# Patient Record
Sex: Female | Born: 1950 | Race: Black or African American | Hispanic: No | Marital: Married | Smoking: Never smoker
Health system: Southern US, Community
[De-identification: ages and names within clinical notes are randomized; demographics above are authoritative.]

## PROBLEM LIST (undated history)

## (undated) DIAGNOSIS — F410 Panic disorder [episodic paroxysmal anxiety] without agoraphobia: Secondary | ICD-10-CM

## (undated) DIAGNOSIS — Z8679 Personal history of other diseases of the circulatory system: Secondary | ICD-10-CM

## (undated) DIAGNOSIS — D649 Anemia, unspecified: Secondary | ICD-10-CM

## (undated) DIAGNOSIS — E785 Hyperlipidemia, unspecified: Secondary | ICD-10-CM

## (undated) DIAGNOSIS — R195 Other fecal abnormalities: Secondary | ICD-10-CM

## (undated) DIAGNOSIS — E669 Obesity, unspecified: Secondary | ICD-10-CM

## (undated) DIAGNOSIS — K219 Gastro-esophageal reflux disease without esophagitis: Secondary | ICD-10-CM

## (undated) DIAGNOSIS — E119 Type 2 diabetes mellitus without complications: Secondary | ICD-10-CM

## (undated) DIAGNOSIS — R7303 Prediabetes: Secondary | ICD-10-CM

## (undated) DIAGNOSIS — L709 Acne, unspecified: Secondary | ICD-10-CM

## (undated) DIAGNOSIS — R1900 Intra-abdominal and pelvic swelling, mass and lump, unspecified site: Secondary | ICD-10-CM

## (undated) DIAGNOSIS — M7551 Bursitis of right shoulder: Secondary | ICD-10-CM

## (undated) DIAGNOSIS — M1990 Unspecified osteoarthritis, unspecified site: Secondary | ICD-10-CM

## (undated) DIAGNOSIS — C50919 Malignant neoplasm of unspecified site of unspecified female breast: Secondary | ICD-10-CM

## (undated) HISTORY — DX: Malignant neoplasm of unspecified site of unspecified female breast: C50.919

## (undated) HISTORY — PX: BREAST BIOPSY: SHX20

## (undated) HISTORY — DX: Acne, unspecified: L70.9

## (undated) HISTORY — DX: Obesity, unspecified: E66.9

## (undated) HISTORY — DX: Anemia, unspecified: D64.9

## (undated) HISTORY — DX: Hyperlipidemia, unspecified: E78.5

## (undated) HISTORY — DX: Personal history of other diseases of the circulatory system: Z86.79

## (undated) HISTORY — DX: Panic disorder (episodic paroxysmal anxiety): F41.0

## (undated) HISTORY — DX: Prediabetes: R73.03

## (undated) HISTORY — PX: ABDOMINAL HYSTERECTOMY: SHX81

## (undated) HISTORY — DX: Intra-abdominal and pelvic swelling, mass and lump, unspecified site: R19.00

## (undated) HISTORY — DX: Other fecal abnormalities: R19.5

---

## 2002-09-16 ENCOUNTER — Other Ambulatory Visit: Admission: RE | Admit: 2002-09-16 | Discharge: 2002-09-16 | Payer: Self-pay | Admitting: Obstetrics and Gynecology

## 2004-05-14 ENCOUNTER — Ambulatory Visit (HOSPITAL_COMMUNITY): Admission: RE | Admit: 2004-05-14 | Discharge: 2004-05-14 | Payer: Self-pay | Admitting: Family Medicine

## 2004-12-04 ENCOUNTER — Other Ambulatory Visit: Admission: RE | Admit: 2004-12-04 | Discharge: 2004-12-04 | Payer: Self-pay | Admitting: Obstetrics and Gynecology

## 2004-12-11 ENCOUNTER — Ambulatory Visit (HOSPITAL_COMMUNITY): Admission: RE | Admit: 2004-12-11 | Discharge: 2004-12-11 | Payer: Self-pay | Admitting: Obstetrics and Gynecology

## 2005-05-11 DIAGNOSIS — Z8679 Personal history of other diseases of the circulatory system: Secondary | ICD-10-CM

## 2005-05-11 HISTORY — DX: Personal history of other diseases of the circulatory system: Z86.79

## 2005-05-14 ENCOUNTER — Ambulatory Visit (HOSPITAL_COMMUNITY): Admission: RE | Admit: 2005-05-14 | Discharge: 2005-05-14 | Payer: Self-pay | Admitting: Obstetrics and Gynecology

## 2006-12-09 ENCOUNTER — Ambulatory Visit (HOSPITAL_COMMUNITY): Admission: RE | Admit: 2006-12-09 | Discharge: 2006-12-09 | Payer: Self-pay | Admitting: Obstetrics and Gynecology

## 2006-12-31 ENCOUNTER — Encounter: Admission: RE | Admit: 2006-12-31 | Discharge: 2006-12-31 | Payer: Self-pay | Admitting: Obstetrics and Gynecology

## 2009-02-13 ENCOUNTER — Other Ambulatory Visit: Admission: RE | Admit: 2009-02-13 | Discharge: 2009-02-13 | Payer: Self-pay | Admitting: Gynecology

## 2009-02-13 ENCOUNTER — Ambulatory Visit: Payer: Self-pay | Admitting: Gynecology

## 2009-02-27 ENCOUNTER — Ambulatory Visit: Payer: Self-pay | Admitting: Gynecology

## 2009-03-28 ENCOUNTER — Ambulatory Visit: Payer: Self-pay | Admitting: Gynecology

## 2009-04-05 ENCOUNTER — Ambulatory Visit: Admission: RE | Admit: 2009-04-05 | Discharge: 2009-04-05 | Payer: Self-pay | Admitting: Gynecologic Oncology

## 2009-06-28 ENCOUNTER — Ambulatory Visit: Payer: Self-pay | Admitting: Gynecology

## 2010-03-07 ENCOUNTER — Other Ambulatory Visit: Payer: Self-pay | Admitting: Gynecology

## 2010-03-07 DIAGNOSIS — Z1239 Encounter for other screening for malignant neoplasm of breast: Secondary | ICD-10-CM

## 2010-03-18 ENCOUNTER — Ambulatory Visit: Payer: Self-pay

## 2010-03-19 ENCOUNTER — Other Ambulatory Visit: Payer: Self-pay | Admitting: Gynecology

## 2010-03-19 ENCOUNTER — Encounter (INDEPENDENT_AMBULATORY_CARE_PROVIDER_SITE_OTHER): Payer: Self-pay | Admitting: *Deleted

## 2010-03-19 DIAGNOSIS — Z0000 Encounter for general adult medical examination without abnormal findings: Secondary | ICD-10-CM

## 2010-03-28 NOTE — Letter (Signed)
Summary: Pre Visit Letter Revised  [facility]  [address] [place], [place] [address]   Phone: [phone]  Fax: [phone]        03/19/2010 MRN: [id number] Brandy Guerrero [address] [place], [place]  [address]             Procedure Date:  April 15, 2010   dir col-Dr Justin Mend to the Gastroenterology Division at [facility]    You are scheduled to see a nurse for your pre-procedure visit on April 01, 2010 at 4:00pm on the 3rd floor at [facility], [address]  We ask that you try to arrive at our office 15 minutes prior to your appointment time to allow for check-in.  Please take a minute to review the attached form.  If you answer "Yes" to one or more of the questions on the first page, we ask that you call the person listed at your earliest opportunity.  If you answer "No" to all of the questions, please complete the rest of the form and bring it to your appointment.    Your nurse visit will consist of discussing your medical and surgical history, your immediate family medical history, and your medications.   If you are unable to list all of your medications on the form, please bring the medication bottles to your appointment and we will list them.  We will need to be aware of both prescribed and over the counter drugs.  We will need to know exact dosage information as well.    Please be prepared to read and sign documents such as consent forms, a financial agreement, and acknowledgement forms.  If necessary, and with your consent, a friend or relative is welcome to sit-in on the nurse visit with you.  Please bring your insurance card so that we may make a copy of it.  If your insurance requires a referral to see a specialist, please bring your referral form from your primary care physician.  No co-pay is required for this nurse visit.     If you cannot keep your appointment, please call [phone] to cancel or reschedule prior to your  appointment date.  This allows [place] the opportunity to schedule an appointment for another patient in need of care.    Thank you for choosing [facility] for your medical needs.  We appreciate the opportunity to care for you.  Please visit [place] at our website  to learn more about our practice.  Sincerely, The Gastroenterology Division

## 2010-04-10 ENCOUNTER — Ambulatory Visit
Admission: RE | Admit: 2010-04-10 | Discharge: 2010-04-10 | Disposition: A | Discharge status: Home / Self Care | Payer: PRIVATE HEALTH INSURANCE | Source: Ambulatory Visit | Attending: Gynecology | Admitting: Gynecology

## 2010-04-10 DIAGNOSIS — Z1239 Encounter for other screening for malignant neoplasm of breast: Secondary | ICD-10-CM

## 2010-04-15 ENCOUNTER — Other Ambulatory Visit: Payer: Self-pay | Admitting: Internal Medicine

## 2011-04-22 ENCOUNTER — Other Ambulatory Visit: Payer: Self-pay | Admitting: Gynecology

## 2011-04-22 DIAGNOSIS — Z1231 Encounter for screening mammogram for malignant neoplasm of breast: Secondary | ICD-10-CM

## 2011-05-16 ENCOUNTER — Ambulatory Visit
Admission: RE | Admit: 2011-05-16 | Discharge: 2011-05-16 | Disposition: A | Discharge status: Home / Self Care | Payer: PRIVATE HEALTH INSURANCE | Source: Ambulatory Visit | Attending: Gynecology | Admitting: Gynecology

## 2011-05-16 DIAGNOSIS — Z1231 Encounter for screening mammogram for malignant neoplasm of breast: Secondary | ICD-10-CM

## 2011-05-20 ENCOUNTER — Encounter: Payer: Self-pay | Admitting: *Deleted

## 2011-05-21 ENCOUNTER — Encounter: Payer: Self-pay | Admitting: Gynecology

## 2011-05-21 ENCOUNTER — Other Ambulatory Visit (HOSPITAL_COMMUNITY)
Admission: RE | Admit: 2011-05-21 | Discharge: 2011-05-21 | Disposition: A | Discharge status: Home / Self Care | Payer: PRIVATE HEALTH INSURANCE | Source: Ambulatory Visit | Attending: Gynecology | Admitting: Gynecology

## 2011-05-21 ENCOUNTER — Ambulatory Visit (INDEPENDENT_AMBULATORY_CARE_PROVIDER_SITE_OTHER): Payer: PRIVATE HEALTH INSURANCE | Admitting: Gynecology

## 2011-05-21 VITALS — BP 110/70 | Ht 63.5 in | Wt 206.0 lb

## 2011-05-21 DIAGNOSIS — Z780 Asymptomatic menopausal state: Secondary | ICD-10-CM

## 2011-05-21 DIAGNOSIS — R1900 Intra-abdominal and pelvic swelling, mass and lump, unspecified site: Secondary | ICD-10-CM

## 2011-05-21 DIAGNOSIS — Z1272 Encounter for screening for malignant neoplasm of vagina: Secondary | ICD-10-CM

## 2011-05-21 DIAGNOSIS — K429 Umbilical hernia without obstruction or gangrene: Secondary | ICD-10-CM

## 2011-05-21 DIAGNOSIS — Z01419 Encounter for gynecological examination (general) (routine) without abnormal findings: Secondary | ICD-10-CM | POA: Insufficient documentation

## 2011-05-21 NOTE — Progress Notes (Addendum)
Estera Ozier 01-Jun-1950 [id number]        61 y.o.  for annual exam.  Several issues noted below  Past medical history,surgical history, medications, allergies, family history and social history were all reviewed and documented in the EPIC chart. ROS:  Was performed and pertinent positives and negatives are included in the history.  Exam: Sherrilyn Rist chaperone present Filed Vitals:   05/21/11 0942  BP: 110/70   General appearance  Normal Skin grossly normal Head/Neck normal with no cervical or supraclavicular adenopathy thyroid normal Lungs  clear Cardiac RR, without RMG Abdominal  soft, nontender, without masses, organomegaly, umbilical hernia noted Breasts  examined lying and sitting without masses, retractions, discharge or axillary adenopathy. Pelvic  Ext/BUS/vagina  normal Pap of cuff done  Adnexa  With grapefruit size mass at the cuff, nontender   Anus and perineum  normal   Rectovaginal  normal sphincter tone without palpated masses or tenderness.    Assessment/Plan:  61 y.o. female for annual exam.    1. Pelvic mass. Patient was seen January 2011 with approximately same size pelvic mass.  She had ultrasound which showed a 12.8 cm complex solid mass. She had a CA 125 that was 5.8 she was referred to GYN oncology and saw Dr. Laurette Schimke who had recommended surgery but the patient never followed up with her to have this done. Reviewed with the patient the need for follow up. Given the circumstance most likely benign or low level malignant as it is unchanged in size and the patient is asymptomatic. Recommended start with baseline CT scan and and probable repeat referral to GYN oncology possible consider laparoscopic surgery as the patient's big concern is down time the surgery. The importance of follow up was stressed with the patient. 2. Umbilical hernia. Patient's had a long history of umbilical hernia which is not overly bothersome to her. Offered referral to Gen. Surgery to consider  repair or if she proceeds with surgery per #1 that could be repaired at the same time the patient will follow up per #1 and then we'll go from there. 3. Colonoscopy. Patient's daughter had a colonoscopy we gave her the name of several facilities and she is to call to arrange this. 4. Mammography. Patient just had her mammogram and continue with annual mammography. SBE monthly reviewed. 5. Pap smear. I did a Pap smear of the cuff today. As I have no significant records from prior Pap smears. 6. Bone density. Patient's never had a bone density and I think given her postmenopausal status we'll get a baseline and she agrees to schedule this. 7. Health maintenance. No blood work was done today she has an appointment in June to see a primary physician to follow her for her medical issues.    Dara Lords MD, 10:08 AM 05/21/2011

## 2011-05-21 NOTE — Patient Instructions (Signed)
Follow up for bone density study as scheduled. Follow up for CT scan my office staff will call and arrange with you. Follow up with primary physician to manage your medical issues. Schedule colonoscopy

## 2011-05-21 NOTE — Progress Notes (Signed)
Addended by: Richardson Chiquito on: 05/21/2011 10:58 AM   Modules accepted: Orders

## 2011-05-22 ENCOUNTER — Other Ambulatory Visit: Payer: Self-pay | Admitting: *Deleted

## 2011-05-22 ENCOUNTER — Telehealth: Payer: Self-pay | Admitting: *Deleted

## 2011-05-22 DIAGNOSIS — R1900 Intra-abdominal and pelvic swelling, mass and lump, unspecified site: Secondary | ICD-10-CM

## 2011-05-22 NOTE — Telephone Encounter (Signed)
Message copied by Mckinley Jewel Stephfon Bovey L on Thu May 22, 2011  9:46 AM ------      Message from: Colin Broach P      Created: Wed May 21, 2011 11:37 AM       Schedule CT scan with and without contrast RE pelvic mass at vaginal cuff, history of hysterectomy

## 2011-05-22 NOTE — Telephone Encounter (Signed)
Lm for patient to call.  Has appt set for CT at [facility] on 05/27/11 @ 10:30 but must arrive for 9:30 for BUN/Creatinine at 9:30.

## 2011-05-23 ENCOUNTER — Encounter: Payer: Self-pay | Admitting: Gynecology

## 2011-05-23 NOTE — Telephone Encounter (Signed)
Lm for patient to call

## 2011-05-26 NOTE — Telephone Encounter (Signed)
Patient informed. 

## 2011-05-27 ENCOUNTER — Ambulatory Visit (HOSPITAL_COMMUNITY)
Admission: RE | Admit: 2011-05-27 | Discharge: 2011-05-27 | Disposition: A | Discharge status: Home / Self Care | Payer: PRIVATE HEALTH INSURANCE | Source: Ambulatory Visit | Attending: Gynecology | Admitting: Gynecology

## 2011-05-27 DIAGNOSIS — R1900 Intra-abdominal and pelvic swelling, mass and lump, unspecified site: Secondary | ICD-10-CM

## 2011-05-28 ENCOUNTER — Ambulatory Visit (HOSPITAL_COMMUNITY)
Admission: RE | Admit: 2011-05-28 | Discharge: 2011-05-28 | Disposition: A | Discharge status: Home / Self Care | Payer: PRIVATE HEALTH INSURANCE | Source: Ambulatory Visit | Attending: Gynecology | Admitting: Gynecology

## 2011-05-28 DIAGNOSIS — Z90710 Acquired absence of both cervix and uterus: Secondary | ICD-10-CM | POA: Insufficient documentation

## 2011-05-28 DIAGNOSIS — K7689 Other specified diseases of liver: Secondary | ICD-10-CM | POA: Insufficient documentation

## 2011-05-28 DIAGNOSIS — Z905 Acquired absence of kidney: Secondary | ICD-10-CM | POA: Insufficient documentation

## 2011-05-28 DIAGNOSIS — N289 Disorder of kidney and ureter, unspecified: Secondary | ICD-10-CM | POA: Insufficient documentation

## 2011-05-28 DIAGNOSIS — K429 Umbilical hernia without obstruction or gangrene: Secondary | ICD-10-CM | POA: Insufficient documentation

## 2011-05-28 DIAGNOSIS — R1909 Other intra-abdominal and pelvic swelling, mass and lump: Secondary | ICD-10-CM | POA: Insufficient documentation

## 2011-05-28 DIAGNOSIS — M129 Arthropathy, unspecified: Secondary | ICD-10-CM | POA: Insufficient documentation

## 2011-05-28 DIAGNOSIS — K5730 Diverticulosis of large intestine without perforation or abscess without bleeding: Secondary | ICD-10-CM | POA: Insufficient documentation

## 2011-05-28 DIAGNOSIS — K5900 Constipation, unspecified: Secondary | ICD-10-CM | POA: Insufficient documentation

## 2011-05-28 MED ORDER — IOHEXOL 300 MG/ML  SOLN
100.0000 mL | Freq: Once | INTRAMUSCULAR | Status: AC | PRN
  Administered 2011-05-28: 100 mL via INTRAVENOUS

## 2011-05-29 ENCOUNTER — Ambulatory Visit (INDEPENDENT_AMBULATORY_CARE_PROVIDER_SITE_OTHER): Payer: PRIVATE HEALTH INSURANCE | Admitting: Gynecology

## 2011-05-29 ENCOUNTER — Encounter: Payer: Self-pay | Admitting: Gynecology

## 2011-05-29 ENCOUNTER — Ambulatory Visit (INDEPENDENT_AMBULATORY_CARE_PROVIDER_SITE_OTHER): Payer: PRIVATE HEALTH INSURANCE

## 2011-05-29 DIAGNOSIS — R1900 Intra-abdominal and pelvic swelling, mass and lump, unspecified site: Secondary | ICD-10-CM

## 2011-05-29 DIAGNOSIS — Z13820 Encounter for screening for osteoporosis: Secondary | ICD-10-CM

## 2011-05-29 DIAGNOSIS — Z780 Asymptomatic menopausal state: Secondary | ICD-10-CM

## 2011-05-29 NOTE — Progress Notes (Signed)
Patient follows up for 2 issues. She actually was here for her DEXA study and I asked to speak to her in reference to her pelvic mass. 1. DEXA. Her X. Is normal but due to her most recent CT scan with contrast L3 and L4 I think are factitiously elevated and BNP and I discarded them. Her L1 and L2 as well as her hips are all normal. I think given the total picture it's not worth having her come back to repeat this and that will accepted these results and then plan on repeating her DEXA at age 6. Patient agrees with this plan. 2. Pelvic mass. Her CT scan shows in part the following results:  "A bilobed pelvic mass is present. The left upper lobulation of  this mass measures 6.0 x 4.8 by 4.1 cm and the lower portion of the mass, which is central within the anatomic pelvis, measures 11.4 x 8.9 by 9.6 cm. This mass appears solid but has Mildly internally heterogeneous elements Right adnexal structure which could represent another extension of this mass or the right ovary measures 2.6 x 2.7 cm on image 60 of series 2. The left external iliac node has a short axis of 0.8 cm on image 63 of series 2.  No omental nodularity or ascites observed. Scattered sigmoid diverticula are present."  I again reviewed my concern that this represents a malignancy. Given the longevity of its presence possibilities for low-grade cancer discussed. She again is reluctant to want to do anything and I stressed my concern and strong recommendation that she follow up with a GYN oncologist to consider removal. I've offered to make this appointment for her today and she declined. She said that she will call back when she looks at her schedule and allow [place] to make this appointment for her. She clearly understands the importance of follow up and the possibility of cancer and that if she does nothing and it is cancer, the possibility of fatality.

## 2011-05-29 NOTE — Patient Instructions (Addendum)
Please call us and allow [place] to arrange an appointment with a GYN oncologist.  There is a possibility this tumor could be cancer and you really need to have this excised.

## 2011-09-17 ENCOUNTER — Ambulatory Visit (INDEPENDENT_AMBULATORY_CARE_PROVIDER_SITE_OTHER): Payer: PRIVATE HEALTH INSURANCE | Admitting: Family Medicine

## 2011-09-17 ENCOUNTER — Encounter: Payer: Self-pay | Admitting: Family Medicine

## 2011-09-17 VITALS — BP 122/70 | HR 64 | Temp 98.0°F | Ht 64.0 in | Wt 209.5 lb

## 2011-09-17 DIAGNOSIS — R002 Palpitations: Secondary | ICD-10-CM | POA: Insufficient documentation

## 2011-09-17 DIAGNOSIS — E669 Obesity, unspecified: Secondary | ICD-10-CM | POA: Insufficient documentation

## 2011-09-17 DIAGNOSIS — K429 Umbilical hernia without obstruction or gangrene: Secondary | ICD-10-CM | POA: Insufficient documentation

## 2011-09-17 DIAGNOSIS — L709 Acne, unspecified: Secondary | ICD-10-CM | POA: Insufficient documentation

## 2011-09-17 DIAGNOSIS — F410 Panic disorder [episodic paroxysmal anxiety] without agoraphobia: Secondary | ICD-10-CM

## 2011-09-17 DIAGNOSIS — I480 Paroxysmal atrial fibrillation: Secondary | ICD-10-CM | POA: Insufficient documentation

## 2011-09-17 DIAGNOSIS — R1900 Intra-abdominal and pelvic swelling, mass and lump, unspecified site: Secondary | ICD-10-CM

## 2011-09-17 DIAGNOSIS — E66811 Obesity, class 1: Secondary | ICD-10-CM | POA: Insufficient documentation

## 2011-09-17 DIAGNOSIS — L708 Other acne: Secondary | ICD-10-CM

## 2011-09-17 DIAGNOSIS — E785 Hyperlipidemia, unspecified: Secondary | ICD-10-CM

## 2011-09-17 DIAGNOSIS — E1169 Type 2 diabetes mellitus with other specified complication: Secondary | ICD-10-CM | POA: Insufficient documentation

## 2011-09-17 DIAGNOSIS — I499 Cardiac arrhythmia, unspecified: Secondary | ICD-10-CM

## 2011-09-17 NOTE — Patient Instructions (Addendum)
Return fasting for blood work.  Return afterwards for physcial. Good to see you today, call us with questions. I do recommend thinking about seeing surgeon about possible removal of mass.

## 2011-09-17 NOTE — Assessment & Plan Note (Signed)
Check FLP at next fasting blood work. 

## 2011-09-17 NOTE — Assessment & Plan Note (Signed)
Per pt.  States prior on coumadin. Have requested records from cards as well as prior PCP [facility]) Continue toprol xl for now.

## 2011-09-17 NOTE — Addendum Note (Signed)
Addended by: Eustaquio Boyden on: 09/17/2011 10:39 PM   Modules accepted: Orders

## 2011-09-17 NOTE — Progress Notes (Addendum)
Subjective:    Patient ID: Brandy Guerrero, female    DOB: October 16, 1950, 61 y.o.   MRN: [id number]  HPI CC: new pt to establish  H/o "heart fluttering".  Has been on coumadin in past.  Self stopped.  Sees Dr. Tresa Endo as cardiologist.  Have requested records from cards.  On doxycycline 100mg  daily for face per dermatologist - ?adult acne.  Pelvic mass - present since at least 2011, on rpt CT 05/2011: Large heterogeneous mass in the pelvis, with left upper lobulation, as well as a right ovarian tissue versus mass extension. Resection is recommended. No ascites or definite omental nodularity.  Dr. Audie Box is OBGYN.  Has extensively discussed ddx and aware could be malignant.  Sounds like has seen onc in past, but has currently decided against resection 2/2 financial concerns.  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  Body mass index is 35.96 kg/(m^2).   Caffeine: none Lives with husband, has grown children, outside pets (husband is deer Therapist, nutritional) Occupation: CNA at [employer], works 2nd shift, [employer] on weekends Activity: no regular exercise Diet: good water, fruits/vegetables daily  Preventative: Well woman with Dr. Audie Box - pap and breast exam 05/2011 Mammogram 05/2011  Medications and allergies reviewed and updated in chart.  Past histories reviewed and updated if relevant as below. There is no problem list on file for this patient.  Past Medical History  Diagnosis Date  . Panic attacks   . HLD (hyperlipidemia)    Past Surgical History  Procedure Date  . Abdominal hysterectomy 1990s    has one ovary remaining ([facility])   History  Substance Use Topics  . Smoking status: Never Smoker   . Smokeless tobacco: Never Used  . Alcohol Use: Yes     Rare   Family History  Problem Relation Age of Onset  . Hypertension Mother   . Alcohol abuse Brother   . Alcohol abuse Father   . Cancer Neg Hx   . Coronary artery disease Neg Hx   . Stroke Neg Hx   . Diabetes Daughter      Allergies  Allergen Reactions  . Sulfa Antibiotics     Pt unsure   Current Outpatient Prescriptions on File Prior to Visit  Medication Sig Dispense Refill  . fish oil-omega-3 fatty acids 1000 MG capsule Take 2 g by mouth daily.      . metoprolol succinate (TOPROL-XL) 25 MG 24 hr tablet Take 25 mg by mouth daily.      . simvastatin (ZOCOR) 40 MG tablet Take 40 mg by mouth daily. Remembers better during day        Review of Systems  Constitutional: Negative for fever, chills, activity change, appetite change, fatigue and unexpected weight change.  HENT: Negative for hearing loss and neck pain.   Eyes: Negative for visual disturbance.  Respiratory: Negative for cough, chest tightness, shortness of breath and wheezing.   Cardiovascular: Negative for chest pain, palpitations and leg swelling.  Gastrointestinal: Negative for nausea, vomiting, abdominal pain, diarrhea, constipation, blood in stool and abdominal distention.  Genitourinary: Negative for hematuria and difficulty urinating.  Musculoskeletal: Negative for myalgias and arthralgias.  Skin: Negative for rash.  Neurological: Positive for headaches (mild intermittent HA). Negative for dizziness, seizures and syncope.  Hematological: Does not bruise/bleed easily.  Psychiatric/Behavioral: Negative for dysphoric mood. The patient is not nervous/anxious.        Objective:   Physical Exam  Nursing note and vitals reviewed. Constitutional: She is oriented to person,  place, and time. She appears well-developed and well-nourished. No distress.       obese  HENT:  Head: Normocephalic and atraumatic.  Right Ear: Hearing, tympanic membrane, external ear and ear canal normal.  Left Ear: Hearing, tympanic membrane, external ear and ear canal normal.  Nose: Nose normal.  Mouth/Throat: Oropharynx is clear and moist. No oropharyngeal exudate.  Eyes: Conjunctivae and EOM are normal. Pupils are equal, round, and reactive to light. No scleral  icterus.  Neck: Normal range of motion. Neck supple. No thyromegaly present.  Cardiovascular: Normal rate, regular rhythm, normal heart sounds and intact distal pulses.   No murmur heard. Pulses:      Radial pulses are 2+ on the right side, and 2+ on the left side.  Pulmonary/Chest: Effort normal and breath sounds normal. No respiratory distress. She has no wheezes. She has no rales.  Abdominal: Soft. Bowel sounds are normal. She exhibits no distension and no mass. There is no tenderness. There is no rebound and no guarding.       3 cm umbilical hernia  Musculoskeletal: Normal range of motion. She exhibits no edema.  Lymphadenopathy:    She has no cervical adenopathy.  Neurological: She is alert and oriented to person, place, and time.       CN grossly intact, station and gait intact  Skin: Skin is warm and dry. No rash noted.  Psychiatric: She has a normal mood and affect. Her behavior is normal. Judgment and thought content normal.       Assessment & Plan:

## 2011-09-17 NOTE — Assessment & Plan Note (Addendum)
Has been present since early 2011, has seen Dr. Nelly Rout (GYN onc) in past and declined resection although this has been recommended multiple times. Discussed recent CT scan 05/2011 with continued concerning finding of pelvic mass.  Pt states doesn't think malignancy because she feels well and has been gaining weight. Discussed how malignancy can only be ruled out by tissue biopsy, and that is why resection is recommended. Pt continues to decline resection/further evaluation of this mass.

## 2011-09-18 ENCOUNTER — Other Ambulatory Visit (INDEPENDENT_AMBULATORY_CARE_PROVIDER_SITE_OTHER): Payer: PRIVATE HEALTH INSURANCE

## 2011-09-18 DIAGNOSIS — I499 Cardiac arrhythmia, unspecified: Secondary | ICD-10-CM

## 2011-09-18 DIAGNOSIS — E785 Hyperlipidemia, unspecified: Secondary | ICD-10-CM

## 2011-09-18 DIAGNOSIS — R1900 Intra-abdominal and pelvic swelling, mass and lump, unspecified site: Secondary | ICD-10-CM

## 2011-09-18 LAB — COMPREHENSIVE METABOLIC PANEL
Albumin: 4 g/dL (ref 3.5–5.2)
Alkaline Phosphatase: 98 U/L (ref 39–117)
BUN: 17 mg/dL (ref 6–23)
CO2: 27 mEq/L (ref 19–32)
Calcium: 9.3 mg/dL (ref 8.4–10.5)
Chloride: 106 mEq/L (ref 96–112)
Creatinine, Ser: 0.8 mg/dL (ref 0.4–1.2)
GFR: 88.99 mL/min (ref >60.00)
Glucose, Bld: 113 mg/dL — ABNORMAL HIGH (ref 70–99)
Potassium: 4.1 mEq/L (ref 3.5–5.1)
Sodium: 140 mEq/L (ref 135–145)
Total Bilirubin: 0.8 mg/dL (ref 0.3–1.2)
Total Protein: 7.5 g/dL (ref 6.0–8.3)

## 2011-09-18 LAB — CBC WITH DIFFERENTIAL/PLATELET
Basophils Absolute: 0 10*3/uL (ref 0.0–0.1)
Eosinophils Relative: 2.6 % (ref 0.0–5.0)
HCT: 36.5 % (ref 36.0–46.0)
Lymphocytes Relative: 28.7 % (ref 12.0–46.0)
Lymphs Abs: 2.5 10*3/uL (ref 0.7–4.0)
MCHC: 32.7 g/dL (ref 30.0–36.0)
MCV: 91.7 fl (ref 78.0–100.0)
Monocytes Absolute: 0.6 10*3/uL (ref 0.1–1.0)
Monocytes Relative: 6.5 % (ref 3.0–12.0)
Neutro Abs: 5.3 10*3/uL (ref 1.4–7.7)
Neutrophils Relative %: 61.7 % (ref 43.0–77.0)
Platelets: 241 10*3/uL (ref 150.0–400.0)
RBC: 3.98 Mil/uL (ref 3.87–5.11)
RDW: 13.6 % (ref 11.5–14.6)
WBC: 8.6 10*3/uL (ref 4.5–10.5)

## 2011-09-18 LAB — LIPID PANEL
Cholesterol: 162 mg/dL (ref 0–200)
HDL: 49.3 mg/dL (ref >39.00)
LDL Cholesterol: 99 mg/dL (ref 0–99)
Triglycerides: 67 mg/dL (ref 0.0–149.0)
VLDL: 13.4 mg/dL (ref 0.0–40.0)

## 2011-09-18 LAB — TSH: TSH: 1.48 u[IU]/mL (ref 0.35–5.50)

## 2011-10-21 ENCOUNTER — Encounter: Payer: Self-pay | Admitting: Family Medicine

## 2011-10-21 ENCOUNTER — Telehealth: Payer: Self-pay | Admitting: Family Medicine

## 2011-10-21 ENCOUNTER — Ambulatory Visit (INDEPENDENT_AMBULATORY_CARE_PROVIDER_SITE_OTHER): Payer: PRIVATE HEALTH INSURANCE | Admitting: Family Medicine

## 2011-10-21 VITALS — BP 114/62 | HR 76 | Temp 98.2°F | Wt 206.8 lb

## 2011-10-21 DIAGNOSIS — E1169 Type 2 diabetes mellitus with other specified complication: Secondary | ICD-10-CM | POA: Insufficient documentation

## 2011-10-21 DIAGNOSIS — E119 Type 2 diabetes mellitus without complications: Secondary | ICD-10-CM | POA: Insufficient documentation

## 2011-10-21 DIAGNOSIS — Z0001 Encounter for general adult medical examination with abnormal findings: Secondary | ICD-10-CM | POA: Insufficient documentation

## 2011-10-21 DIAGNOSIS — R1900 Intra-abdominal and pelvic swelling, mass and lump, unspecified site: Secondary | ICD-10-CM

## 2011-10-21 DIAGNOSIS — E669 Obesity, unspecified: Secondary | ICD-10-CM

## 2011-10-21 DIAGNOSIS — Z23 Encounter for immunization: Secondary | ICD-10-CM

## 2011-10-21 DIAGNOSIS — I499 Cardiac arrhythmia, unspecified: Secondary | ICD-10-CM

## 2011-10-21 DIAGNOSIS — R739 Hyperglycemia, unspecified: Secondary | ICD-10-CM

## 2011-10-21 DIAGNOSIS — Z0000 Encounter for general adult medical examination without abnormal findings: Secondary | ICD-10-CM | POA: Insufficient documentation

## 2011-10-21 DIAGNOSIS — E785 Hyperlipidemia, unspecified: Secondary | ICD-10-CM

## 2011-10-21 DIAGNOSIS — Z1211 Encounter for screening for malignant neoplasm of colon: Secondary | ICD-10-CM

## 2011-10-21 DIAGNOSIS — R7309 Other abnormal glucose: Secondary | ICD-10-CM

## 2011-10-21 MED ORDER — METOPROLOL TARTRATE 25 MG PO TABS: 25.0000 mg | ORAL_TABLET | Freq: Two times a day (BID) | ORAL | Status: DC | PRN

## 2011-10-21 MED ORDER — TOPIRAMATE 25 MG PO TABS: 25.0000 mg | ORAL_TABLET | Freq: Every day | ORAL | Status: DC

## 2011-10-21 NOTE — Assessment & Plan Note (Signed)
Will again request records from Dr. Tresa Endo.

## 2011-10-21 NOTE — Telephone Encounter (Signed)
rec try vaseline first

## 2011-10-21 NOTE — Assessment & Plan Note (Signed)
Reviewed #s with pt, encouraged weight loss and increased activity.

## 2011-10-21 NOTE — Assessment & Plan Note (Signed)
Preventative protocols reviewed and updated unless pt declined. discussed healthy diet/lifestyle. iFOB today Tdap today. Well woman with OBGYN done 05/2011.

## 2011-10-21 NOTE — Assessment & Plan Note (Signed)
Discussed lifestyle/diet changes.  Discussed how weight loss meds have not been shown to keep weight off.  Pt still desires to use medication to help start lifestyle changes. Discussed qnexa.  Discussed avoidance of stimulants given ?h/o arrhythmia. Will do trial of topamax 25mg  daily.  rtc 3 mo for f/u.

## 2011-10-21 NOTE — Assessment & Plan Note (Signed)
Chronic, stable. Great control on current med. Lab Results  Component Value Date   CHOL 162 09/18/2011   HDL 49.30 09/18/2011   LDLCALC 99 09/18/2011   TRIG 67.0 09/18/2011   CHOLHDL 3 09/18/2011

## 2011-10-21 NOTE — Telephone Encounter (Signed)
When checking out pt, she said she forgot to ask you what she can do for her dry lips, she has tried a lot of different moisturizers and chap sticks and nothing has help, she said they are always dry and cracking

## 2011-10-21 NOTE — Progress Notes (Signed)
Subjective:    Patient ID: Brandy Guerrero, female    DOB: 04-25-1950, 61 y.o.   MRN: [id number]  HPI CC: CPE   Pelvic mass - has not f/u with GYN onc.  Declines further eval currently.  See prior note.  Aware of risks of this approach.  Continues to defer 2/2 financial concerns.  Thinks eventually will pursue surgery, and will call GYN when decides to pursue treatment.  H/o "heart fluttering". Has been on coumadin in past. Self stopped. Prior saw Dr. Tresa Endo as cardiologist. We have requested records from cards but not received yet.  Has not f/u with Dr. Tresa Endo in several years.  Occasional heart fluttering associated with dizziness, attributes to stress.  Prescribed metoprolol XL for this.  However doesn't take regularly, just PRN.  Does not want to take regularly.  No recent heart flutter episodes.  Caffeine: none  Lives with husband, has grown children, outside pets (husband is Product manager)  Occupation: CNA at [employer], works 2nd shift, [employer] on weekends  Activity: starting regular walking - 3 mi about 2d/wk Diet: good water, fruits/vegetables daily   Preventative:  Well woman with Dr. Audie Box - pap and breast exam 05/2011  Mammogram 05/2011 Tetanus today. (Tdap) Colon cancer screening - ifob requested. Recommended flu shot yearly.  Wt Readings from Last 3 Encounters:  10/21/11 206 lb 12 oz (93.781 kg)  09/17/11 209 lb 8 oz (95.029 kg)  05/21/11 206 lb (93.441 kg)    Medications and allergies reviewed and updated in chart.  Past histories reviewed and updated if relevant as below. Patient Active Problem List  Diagnosis  . Panic attacks  . HLD (hyperlipidemia)  . Acne  . Pelvic mass  . Irregular heart rate  . Umbilical hernia  . Obesity   Past Medical History  Diagnosis Date  . Panic attacks   . HLD (hyperlipidemia)   . Acne     dermatology - on doxy 100mg  daily  . Pelvic mass     declines resection  . Obesity    Past Surgical History  Procedure Date  .  Abdominal hysterectomy 1990s    has one ovary remaining ([facility])   History  Substance Use Topics  . Smoking status: Never Smoker   . Smokeless tobacco: Never Used  . Alcohol Use: Yes     Rare   Family History  Problem Relation Age of Onset  . Hypertension Mother   . Alcohol abuse Brother   . Alcohol abuse Father   . Cancer Neg Hx   . Coronary artery disease Neg Hx   . Stroke Neg Hx   . Diabetes Daughter    Allergies  Allergen Reactions  . Sulfa Antibiotics Rash   Current Outpatient Prescriptions on File Prior to Visit  Medication Sig Dispense Refill  . aspirin 81 MG tablet Take 81 mg by mouth daily.      Marland Kitchen doxycycline (VIBRAMYCIN) 100 MG capsule Take 100 mg by mouth daily. For face      . fish oil-omega-3 fatty acids 1000 MG capsule Take 2 g by mouth daily.      . meloxicam (MOBIC) 15 MG tablet Take 15 mg by mouth daily.      . NON FORMULARY Hair and nail vitamin      . simvastatin (ZOCOR) 40 MG tablet Take 40 mg by mouth daily. Remembers better during day      . metoprolol tartrate (LOPRESSOR) 25 MG tablet Take 1 tablet (25 mg total) by mouth  2 (two) times daily as needed. Heart fluttering  60 tablet  3   Review of Systems  Constitutional: Negative for fever, chills, activity change, appetite change, fatigue and unexpected weight change.  HENT: Negative for hearing loss and neck pain.   Eyes: Negative for visual disturbance.  Respiratory: Negative for cough, chest tightness, shortness of breath and wheezing.   Cardiovascular: Negative for chest pain, palpitations and leg swelling.  Gastrointestinal: Negative for nausea, vomiting, abdominal pain, diarrhea, constipation, blood in stool and abdominal distention.  Genitourinary: Negative for hematuria, vaginal bleeding, vaginal discharge and difficulty urinating.  Musculoskeletal: Negative for myalgias and arthralgias.  Skin: Negative for rash.  Neurological: Positive for headaches (mild intermittent HA). Negative for  dizziness, seizures and syncope.  Hematological: Does not bruise/bleed easily.  Psychiatric/Behavioral: Negative for dysphoric mood. The patient is not nervous/anxious.        Objective:   Physical Exam  Nursing note and vitals reviewed. Constitutional: She is oriented to person, place, and time. She appears well-developed and well-nourished. No distress.  HENT:  Head: Normocephalic and atraumatic.  Right Ear: Hearing, tympanic membrane, external ear and ear canal normal.  Left Ear: Hearing, tympanic membrane, external ear and ear canal normal.  Nose: Nose normal.  Mouth/Throat: Oropharynx is clear and moist. No oropharyngeal exudate.  Eyes: Conjunctivae and EOM are normal. Pupils are equal, round, and reactive to light. No scleral icterus.  Neck: Normal range of motion. Neck supple.  Cardiovascular: Normal rate, regular rhythm, normal heart sounds and intact distal pulses.   No murmur heard. Pulses:      Radial pulses are 2+ on the right side, and 2+ on the left side.  Pulmonary/Chest: Effort normal and breath sounds normal. No respiratory distress. She has no wheezes. She has no rales.  Abdominal: Soft. Bowel sounds are normal. She exhibits no distension and no mass. There is no tenderness. There is no rebound and no guarding.  Musculoskeletal: Normal range of motion. She exhibits no edema.  Lymphadenopathy:    She has no cervical adenopathy.  Neurological: She is alert and oriented to person, place, and time.       CN grossly intact, station and gait intact  Skin: Skin is warm and dry. No rash noted.  Psychiatric: She has a normal mood and affect. Her behavior is normal. Judgment and thought content normal.       Assessment & Plan:

## 2011-10-21 NOTE — Patient Instructions (Addendum)
Tetanus shot today (Tdap). I recommend flu shot this season. Sent home with stool kit today. Good to see you. Keep working on diet changes and exercise - we will keep an eye on your sugars as today somewhat elevated. May try topamax for weight.  Return to see me in 3 months to see how topamax is working.  Take 25mg  daily. Look into the mediterranean diet.

## 2011-10-21 NOTE — Assessment & Plan Note (Signed)
See prior note.  Again discussed reasons behind recommendation to excise tumor, concern for possible spread and need to r/o malignancy. Pt states will continue to consider and contact GYN when decides to have this done.

## 2011-10-21 NOTE — Telephone Encounter (Signed)
Left message advising pt to try Vaseline for dry lips 1st

## 2011-10-24 ENCOUNTER — Other Ambulatory Visit: Payer: PRIVATE HEALTH INSURANCE

## 2011-10-24 DIAGNOSIS — Z1211 Encounter for screening for malignant neoplasm of colon: Secondary | ICD-10-CM

## 2011-10-24 LAB — FECAL OCCULT BLOOD, IMMUNOCHEMICAL: Fecal Occult Bld: POSITIVE

## 2011-10-27 ENCOUNTER — Other Ambulatory Visit: Payer: Self-pay | Admitting: Family Medicine

## 2011-10-27 DIAGNOSIS — R195 Other fecal abnormalities: Secondary | ICD-10-CM | POA: Insufficient documentation

## 2011-10-30 ENCOUNTER — Encounter: Payer: Self-pay | Admitting: Internal Medicine

## 2011-11-03 ENCOUNTER — Encounter: Payer: Self-pay | Admitting: Family Medicine

## 2012-01-02 ENCOUNTER — Ambulatory Visit (AMBULATORY_SURGERY_CENTER): Payer: Commercial Managed Care - PPO | Admitting: *Deleted

## 2012-01-02 VITALS — Ht 64.0 in | Wt 205.0 lb

## 2012-01-02 DIAGNOSIS — Z1211 Encounter for screening for malignant neoplasm of colon: Secondary | ICD-10-CM

## 2012-01-02 MED ORDER — MOVIPREP 100 G PO SOLR: ORAL | Status: DC

## 2012-01-05 ENCOUNTER — Encounter: Payer: Self-pay | Admitting: Internal Medicine

## 2012-01-11 HISTORY — PX: COLONOSCOPY: SHX174

## 2012-01-13 ENCOUNTER — Telehealth: Payer: Self-pay | Admitting: Family Medicine

## 2012-01-13 MED ORDER — SIMVASTATIN 40 MG PO TABS: 40.0000 mg | ORAL_TABLET | Freq: Every day | ORAL | Status: DC

## 2012-01-13 NOTE — Telephone Encounter (Signed)
Patient has an appt. On 12/10 (next week)  Patient is out of her cholesterol medicine.  She was getting this from her old physician but now she has switched to our office.  She wants to know if we can refill this medicine.

## 2012-01-13 NOTE — Telephone Encounter (Signed)
plz notify sent in 1 mo supply.

## 2012-01-14 NOTE — Telephone Encounter (Signed)
Patient advised.

## 2012-01-16 ENCOUNTER — Encounter: Payer: Self-pay | Admitting: Internal Medicine

## 2012-01-16 ENCOUNTER — Ambulatory Visit (AMBULATORY_SURGERY_CENTER): Payer: Commercial Managed Care - PPO | Admitting: Internal Medicine

## 2012-01-16 VITALS — BP 118/70 | HR 59 | Temp 96.9°F | Resp 21 | Ht 64.0 in | Wt 205.0 lb

## 2012-01-16 DIAGNOSIS — Z1211 Encounter for screening for malignant neoplasm of colon: Secondary | ICD-10-CM

## 2012-01-16 DIAGNOSIS — R195 Other fecal abnormalities: Secondary | ICD-10-CM

## 2012-01-16 MED ORDER — SODIUM CHLORIDE 0.9 % IV SOLN: 500.0000 mL | INTRAVENOUS | Status: DC

## 2012-01-16 NOTE — Progress Notes (Signed)
Patient did not experience any of the following events: a burn prior to discharge; a fall within the facility; wrong site/side/patient/procedure/implant event; or a hospital transfer or hospital admission upon discharge from the facility. (G8907) Patient did not have preoperative order for IV antibiotic SSI prophylaxis. (G8918)  

## 2012-01-16 NOTE — Op Note (Signed)
[facility] [address] [place], [address]   COLONOSCOPY PROCEDURE REPORT  PATIENT: Brandy Guerrero, Brandy Guerrero  MR#: [id number] BIRTHDATE: 1950-10-05 , 61  yrs. old GENDER: Female ENDOSCOPIST: Hart Carwin, MD REFERRED BY:  Eustaquio Boyden, M.D. PROCEDURE DATE:  01/16/2012 PROCEDURE:   Colonoscopy, diagnostic and Colonoscopy, screening ASA CLASS:   Class II INDICATIONS:Average risk patient for colon cancer and heme-positive stool. MEDICATIONS: MAC sedation, administered by CRNA and Propofol (Diprivan) 190 mg IV  DESCRIPTION OF PROCEDURE:   After the risks and benefits and of the procedure were explained, informed consent was obtained.  A digital rectal exam revealed no abnormalities of the rectum.    The LB [id number]  endoscope was introduced through the anus and advanced to the cecum, which was identified by both the appendix and ileocecal valve .  The quality of the prep was excellent, using MoviPrep .  The instrument was then slowly withdrawn as the colon was fully examined.     COLON FINDINGS: Mild diverticulosis was noted in the sigmoid colon. Retroflexed views revealed no abnormalities.     The scope was then withdrawn from the patient and the procedure completed.  COMPLICATIONS: There were no complications. ENDOSCOPIC IMPRESSION: Mild diverticulosis was noted in the sigmoid colon nothing to account for the heme positive stool, no hemorrhoids RECOMMENDATIONS: repeat stool cards, if positive again, suggest  EGD  REPEAT EXAM: In 10 year(s)  for Colonoscopy.  cc:  _______________________________ eSignedHart Carwin, MD 01/16/2012 8:39 AM

## 2012-01-16 NOTE — Patient Instructions (Addendum)

## 2012-01-18 ENCOUNTER — Encounter: Payer: Self-pay | Admitting: Internal Medicine

## 2012-01-19 ENCOUNTER — Telehealth: Payer: Self-pay | Admitting: *Deleted

## 2012-01-19 NOTE — Telephone Encounter (Signed)
  Follow up Call-  Call back number 01/16/2012  Post procedure Call Back phone  # [phone]  Permission to leave phone message Yes     No answer, left message!!

## 2012-01-20 ENCOUNTER — Ambulatory Visit (INDEPENDENT_AMBULATORY_CARE_PROVIDER_SITE_OTHER): Payer: Commercial Managed Care - PPO | Admitting: Family Medicine

## 2012-01-20 ENCOUNTER — Encounter: Payer: Self-pay | Admitting: Family Medicine

## 2012-01-20 VITALS — BP 128/64 | HR 76 | Temp 98.4°F | Wt 202.8 lb

## 2012-01-20 DIAGNOSIS — R195 Other fecal abnormalities: Secondary | ICD-10-CM

## 2012-01-20 DIAGNOSIS — E785 Hyperlipidemia, unspecified: Secondary | ICD-10-CM

## 2012-01-20 DIAGNOSIS — E669 Obesity, unspecified: Secondary | ICD-10-CM

## 2012-01-20 DIAGNOSIS — R1900 Intra-abdominal and pelvic swelling, mass and lump, unspecified site: Secondary | ICD-10-CM

## 2012-01-20 MED ORDER — SIMVASTATIN 40 MG PO TABS: 40.0000 mg | ORAL_TABLET | Freq: Every day | ORAL | Status: DC

## 2012-01-20 NOTE — Assessment & Plan Note (Addendum)
Colonoscopy showing only moderate diverticulosis, no evidence of bleed.  rec by GI was to rpt stool cards and if positive consider EGD.  Pt will repeat stool cards in ~3 wks and will bring into office or mail into lab. Recommended avoiding NSAIDs in interim.

## 2012-01-20 NOTE — Assessment & Plan Note (Signed)
Chronic, stable.  Continue simvastatin.  Good control as of last FLP 09/2011.

## 2012-01-20 NOTE — Patient Instructions (Addendum)
Consider backing off meloxicam/mobic and try extra strength tylenol as needed for knee pain because mobic can cause bleeding and irritation and won't let aspirin work as well. Good to see you today, call us with questions. Try topamax for another few weeks, if not noticing improvement, then may stop. Good to see you today call us with questions. High fiber handout provided. Fill out stool card in 3-4 weeks and return.  If continued blood, then we will recommend return to GI for consideration of endoscopy to look at upper GI tract for source bleeding.

## 2012-01-20 NOTE — Assessment & Plan Note (Signed)
Brandy Guerrero is not sure if topamax has caused any appetite suppression.  She desires to continue this med for next few months and will then decide whether to continue or stop.   Noted 4lb weight loss.

## 2012-01-20 NOTE — Assessment & Plan Note (Signed)
Again encouraged f/u with GYN onc.  Pt states she may consider treatment this summer.  Discussed concerns with malignancy.  Pt aware may be cancer. 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  Discussed sometimes needs to put her health ahead of others'.

## 2012-01-20 NOTE — Progress Notes (Signed)
  Subjective:    Patient ID: Brandy Guerrero, female    DOB: 1950/06/20, 61 y.o.   MRN: [id number]  HPI CC: 3 mo f/u  Having family stress - children often don't get along.  Started last visit on topamax 25mg  daily for appetite suppressant.  Hasn't really helped but does notice 4lb weight loss. Wt Readings from Last 3 Encounters:  01/20/12 202 lb 12 oz (91.967 kg)  01/16/12 205 lb (92.987 kg)  01/02/12 205 lb (92.987 kg)    R knee pain - takes mobic daily.  Sees ortho at [facility].  Recent colonoscopy after positive stool kit returned with mod diverticulosis - discussed this.  No hemorrhoids or other reason for bleed.  Recommendation by GI was to rpt stool cards and if again positive, consider EGD.  Pt was given stool cards by GI, wants to f/u here for this.  HLD - on simvastatin.  No myalgias.  Continues to decline further eval of pelvic mass.  Past Medical History  Diagnosis Date  . Panic attacks   . HLD (hyperlipidemia)   . Acne     dermatology - on doxy 100mg  daily  . Pelvic mass     declines resection  . Obesity   . History of atrial fibrillation 05/2005    h/o parox Afib, nl cardiolite/echo, no prolonged episodes.  metoprolol prn  . Anemia      Review of Systems Per HPI    Objective:   Physical Exam  Nursing note and vitals reviewed. Constitutional: She appears well-developed and well-nourished. No distress.  HENT:  Mouth/Throat: Oropharynx is clear and moist. No oropharyngeal exudate.  Cardiovascular: Normal rate, regular rhythm, normal heart sounds and intact distal pulses.   No murmur heard. Pulmonary/Chest: Effort normal and breath sounds normal. No respiratory distress. She has no wheezes. She has no rales.  Musculoskeletal: She exhibits no edema.  Psychiatric: She has a normal mood and affect.       Assessment & Plan:

## 2012-02-11 DIAGNOSIS — R195 Other fecal abnormalities: Secondary | ICD-10-CM

## 2012-02-11 HISTORY — DX: Other fecal abnormalities: R19.5

## 2012-04-27 ENCOUNTER — Ambulatory Visit: Payer: Commercial Managed Care - PPO | Admitting: Family Medicine

## 2012-05-04 ENCOUNTER — Ambulatory Visit: Payer: Commercial Managed Care - PPO | Admitting: Family Medicine

## 2012-05-05 ENCOUNTER — Ambulatory Visit (INDEPENDENT_AMBULATORY_CARE_PROVIDER_SITE_OTHER): Payer: Commercial Managed Care - PPO | Admitting: Family Medicine

## 2012-05-05 ENCOUNTER — Encounter: Payer: Self-pay | Admitting: Family Medicine

## 2012-05-05 VITALS — BP 124/72 | HR 80 | Temp 98.0°F | Wt 195.0 lb

## 2012-05-05 DIAGNOSIS — E669 Obesity, unspecified: Secondary | ICD-10-CM

## 2012-05-05 DIAGNOSIS — R739 Hyperglycemia, unspecified: Secondary | ICD-10-CM

## 2012-05-05 DIAGNOSIS — R7309 Other abnormal glucose: Secondary | ICD-10-CM

## 2012-05-05 DIAGNOSIS — R195 Other fecal abnormalities: Secondary | ICD-10-CM

## 2012-05-05 DIAGNOSIS — R1900 Intra-abdominal and pelvic swelling, mass and lump, unspecified site: Secondary | ICD-10-CM

## 2012-05-05 DIAGNOSIS — E785 Hyperlipidemia, unspecified: Secondary | ICD-10-CM

## 2012-05-05 LAB — COMPREHENSIVE METABOLIC PANEL
Albumin: 3.9 g/dL (ref 3.5–5.2)
Alkaline Phosphatase: 104 U/L (ref 39–117)
Chloride: 104 mEq/L (ref 96–112)
Glucose, Bld: 123 mg/dL — ABNORMAL HIGH (ref 70–99)
Potassium: 4.1 mEq/L (ref 3.5–5.1)
Sodium: 142 mEq/L (ref 135–145)
Total Protein: 7.5 g/dL (ref 6.0–8.3)

## 2012-05-05 LAB — CBC WITH DIFFERENTIAL/PLATELET
Eosinophils Absolute: 0.2 10*3/uL (ref 0.0–0.7)
Eosinophils Relative: 1.8 % (ref 0.0–5.0)
MCHC: 32.8 g/dL (ref 30.0–36.0)
MCV: 89.6 fl (ref 78.0–100.0)
Monocytes Absolute: 0.6 10*3/uL (ref 0.1–1.0)
Neutrophils Relative %: 66.1 % (ref 43.0–77.0)
Platelets: 256 10*3/uL (ref 150.0–400.0)
WBC: 8.3 10*3/uL (ref 4.5–10.5)

## 2012-05-05 LAB — HEMOGLOBIN A1C: Hgb A1c MFr Bld: 6.3 % (ref 4.6–6.5)

## 2012-05-05 LAB — LIPID PANEL: VLDL: 9.6 mg/dL (ref 0.0–40.0)

## 2012-05-05 LAB — TSH: TSH: 1.1 u[IU]/mL (ref 0.35–5.50)

## 2012-05-05 NOTE — Assessment & Plan Note (Signed)
Check FLP today. Compliant with zocor 40mg  daily.

## 2012-05-05 NOTE — Progress Notes (Signed)
  Subjective:    Patient ID: Brandy Guerrero, female    DOB: 02-08-1951, 62 y.o.   MRN: [id number]  HPI CC: 3 mo f/u  Positive stool cards - s/p colonoscopy revealing diverticulosis, per GI recommendation was to repeat stool kits and if remained + recommended EGD.  Never returned stool cards.  Encouraged to return this week.  Has been taking aleve, ibuprofen, and meloxicam (intermittently, separately) for knee pain despite last visit recommending tylenol 2/2 GI concern.  Trying to lose weight - decreased portion sizes especially at night time.  Working 3 jobs.  Stays very active.  On topamax for this. Wt Readings from Last 3 Encounters:  05/05/12 195 lb (88.451 kg)  01/20/12 202 lb 12 oz (91.967 kg)  01/16/12 205 lb (92.987 kg)   Pelvic mass - continues to decline further eval of pelvic mass.  Aware of risk of low grade cancer as cause of mass.  Due to see OBGYN.  States would consider seeing female GYN onc, but declines referral today, states wants to discuss with her OBGYN.   Past Medical History  Diagnosis Date  . Panic attacks   . HLD (hyperlipidemia)   . Acne     dermatology - on doxy 100mg  daily  . Pelvic mass     declines resection  . Obesity   . History of atrial fibrillation 05/2005    h/o parox Afib, nl cardiolite/echo, no prolonged episodes.  metoprolol prn  . Anemia      Review of Systems Per HPI    Objective:   Physical Exam  Nursing note and vitals reviewed. Constitutional: She appears well-developed and well-nourished. No distress.  HENT:  Mouth/Throat: Oropharynx is clear and moist. No oropharyngeal exudate.  Cardiovascular: Normal rate, regular rhythm, normal heart sounds and intact distal pulses.   No murmur heard. Pulmonary/Chest: Effort normal and breath sounds normal. No respiratory distress. She has no wheezes. She has no rales.  Abdominal: Soft. Bowel sounds are normal. She exhibits no distension. There is no tenderness. There is no rebound and no guarding.  A hernia (umbilical) is present.  Musculoskeletal: She exhibits no edema.  Skin: Skin is warm and dry. No rash noted.  Psychiatric: She has a normal mood and affect.       Assessment & Plan:

## 2012-05-05 NOTE — Assessment & Plan Note (Signed)
Check A1c today.

## 2012-05-05 NOTE — Assessment & Plan Note (Signed)
Aware of risks of declining surgery.  Main concern is extended leave she will need for recovery. States she will schedule upcoming well woman appt with OBGYN, and will discuss this with Dr. Audie Box.  May be more prone to seeing female GYN onc provider. Has stated may consider surgery this summer.

## 2012-05-05 NOTE — Assessment & Plan Note (Signed)
Wt Readings from Last 3 Encounters:  05/05/12 195 lb (88.451 kg)  01/20/12 202 lb 12 oz (91.967 kg)  01/16/12 205 lb (92.987 kg)  Body mass index is 33.46 kg/(m^2). States trying to lose weight.

## 2012-05-05 NOTE — Assessment & Plan Note (Signed)
Workup so far revealing moderate diverticulosis, no bleed on recent colonoscopy. Never returned repeat stool cards. I've asked her to to return them in the next week.  She has them at home. Again recommended avoiding NSAIDs.

## 2012-05-05 NOTE — Patient Instructions (Addendum)
Try tylenol 500 mg up to 2 pills twice daily as needed for pain. I'd avoid meloxicam and aleve and ibuprofen and motrin (all same family) because they don't let aspirin work as well, and they can cause bleeding. Schedule appointment for well woman exam with Dr. Audie Box. If you want [place] to make referral, let us know. Drink more water to help with swelling. Return stool cards in the next week.

## 2012-05-07 ENCOUNTER — Encounter: Payer: Self-pay | Admitting: *Deleted

## 2012-07-08 ENCOUNTER — Other Ambulatory Visit (INDEPENDENT_AMBULATORY_CARE_PROVIDER_SITE_OTHER): Payer: Commercial Managed Care - PPO

## 2012-07-08 DIAGNOSIS — Z1211 Encounter for screening for malignant neoplasm of colon: Secondary | ICD-10-CM

## 2012-07-09 ENCOUNTER — Other Ambulatory Visit: Payer: Self-pay | Admitting: Family Medicine

## 2012-07-09 DIAGNOSIS — Z1211 Encounter for screening for malignant neoplasm of colon: Secondary | ICD-10-CM

## 2012-07-10 ENCOUNTER — Encounter: Payer: Self-pay | Admitting: Family Medicine

## 2012-07-12 ENCOUNTER — Other Ambulatory Visit: Payer: Self-pay

## 2012-07-12 ENCOUNTER — Encounter: Payer: Self-pay | Admitting: *Deleted

## 2012-07-12 DIAGNOSIS — Z1231 Encounter for screening mammogram for malignant neoplasm of breast: Secondary | ICD-10-CM

## 2012-07-21 ENCOUNTER — Ambulatory Visit (INDEPENDENT_AMBULATORY_CARE_PROVIDER_SITE_OTHER): Payer: Commercial Managed Care - PPO | Admitting: Family Medicine

## 2012-07-21 ENCOUNTER — Encounter: Payer: Self-pay | Admitting: *Deleted

## 2012-07-21 ENCOUNTER — Encounter: Payer: Self-pay | Admitting: Family Medicine

## 2012-07-21 VITALS — BP 110/68 | HR 70 | Temp 98.4°F | Wt 201.5 lb

## 2012-07-21 DIAGNOSIS — J019 Acute sinusitis, unspecified: Secondary | ICD-10-CM | POA: Insufficient documentation

## 2012-07-21 MED ORDER — AMOXICILLIN-POT CLAVULANATE 875-125 MG PO TABS: 1.0000 | ORAL_TABLET | Freq: Two times a day (BID) | ORAL | Status: AC

## 2012-07-21 MED ORDER — GUAIFENESIN-CODEINE 100-10 MG/5ML PO SYRP: 5.0000 mL | ORAL_SOLUTION | Freq: Every evening | ORAL | Status: DC | PRN

## 2012-07-21 NOTE — Assessment & Plan Note (Signed)
Given short duration, anticipate viral. Will treat with supportive care as per instructions. WASP for augmentin provided in case not improving as expected. Cheratussin for cough at night.

## 2012-07-21 NOTE — Patient Instructions (Addendum)
You have a sinus infection, likely viral. If not improving as expected - fill antibiotic (augmentin twice daily for 10 days). Push fluids and plenty of rest. Nasal saline irrigation or neti pot to help drain sinuses. May use plain mucinex or immediate release guaifenesin with plenty of fluid to help mobilize mucous. Let us know if fever >101.5, trouble opening/closing mouth, difficulty swallowing, or worsening - you may need to be seen again.

## 2012-07-21 NOTE — Progress Notes (Signed)
  Subjective:    Patient ID: Brandy Guerrero, female    DOB: October 13, 1950, 62 y.o.   MRN: [id number]  HPI CC: cough, sinus congestion  5d h/o chest and sinus congestion associated with cough.  Started with facial pain then last night increased cough congestion.  Cough productive of green mucous.  Blowing nose with clear mucous.  + HA and dizziness last night.   + subjective fever over weekend.  Decreased appetite recently.  Trouble sleeping 2/2 night sweats.  No nausea/vomiting, diarrhea.  So far has tried OTC spray which helped ST.  Has also tried alka seltzer cold and mucinex.   Unsure if has had sick contacts at home or work.  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 No h/o asthma.  No smokers at home.  On doxy 100mg  daily for acne.  Wt Readings from Last 3 Encounters:  07/21/12 201 lb 8 oz (91.4 kg)  05/05/12 195 lb (88.451 kg)  01/20/12 202 lb 12 oz (91.967 kg)    Past Medical History  Diagnosis Date  . Panic attacks   . HLD (hyperlipidemia)   . Acne     dermatology - on doxy 100mg  daily  . Pelvic mass     declines resection  . Obesity   . History of atrial fibrillation 05/2005    h/o parox Afib, nl cardiolite/echo, no prolonged episodes.  metoprolol prn  . Anemia   . Positive occult stool blood test 2014    s/p normal colonoscopy, rpt stool cards negative     Review of Systems Per HPI    Objective:   Physical Exam  Nursing note and vitals reviewed. Constitutional: She appears well-developed and well-nourished. No distress.  HENT:  Head: Normocephalic and atraumatic.  Right Ear: Hearing, tympanic membrane, external ear and ear canal normal.  Left Ear: Hearing, tympanic membrane, external ear and ear canal normal.  Nose: No mucosal edema or rhinorrhea. Right sinus exhibits no maxillary sinus tenderness and no frontal sinus tenderness. Left sinus exhibits maxillary sinus tenderness. Left sinus exhibits no frontal sinus tenderness.  Mouth/Throat: Uvula is midline, oropharynx is  clear and moist and mucous membranes are normal. No oropharyngeal exudate, posterior oropharyngeal edema, posterior oropharyngeal erythema or tonsillar abscesses.  Scaling from recurrent blowing nose  Eyes: Conjunctivae and EOM are normal. Pupils are equal, round, and reactive to light. No scleral icterus.  Neck: Normal range of motion. Neck supple.  Cardiovascular: Normal rate, regular rhythm, normal heart sounds and intact distal pulses.   No murmur heard. Pulmonary/Chest: Effort normal and breath sounds normal. No respiratory distress. She has no wheezes. She has no rales.  Lymphadenopathy:    She has no cervical adenopathy.  Skin: Skin is warm and dry. No rash noted.       Assessment & Plan:

## 2012-08-16 ENCOUNTER — Ambulatory Visit: Payer: Commercial Managed Care - PPO

## 2012-09-01 ENCOUNTER — Ambulatory Visit: Payer: Commercial Managed Care - PPO | Admitting: Family Medicine

## 2012-09-01 DIAGNOSIS — Z0289 Encounter for other administrative examinations: Secondary | ICD-10-CM

## 2012-09-27 ENCOUNTER — Ambulatory Visit
Admission: RE | Admit: 2012-09-27 | Discharge: 2012-09-27 | Disposition: A | Discharge status: Home / Self Care | Payer: Commercial Managed Care - PPO | Source: Ambulatory Visit

## 2012-09-27 DIAGNOSIS — Z1231 Encounter for screening mammogram for malignant neoplasm of breast: Secondary | ICD-10-CM

## 2013-01-24 ENCOUNTER — Other Ambulatory Visit: Payer: Self-pay | Admitting: Family Medicine

## 2013-03-25 ENCOUNTER — Other Ambulatory Visit: Payer: Self-pay | Admitting: Family Medicine

## 2013-03-25 ENCOUNTER — Other Ambulatory Visit (INDEPENDENT_AMBULATORY_CARE_PROVIDER_SITE_OTHER): Payer: Commercial Managed Care - PPO

## 2013-03-25 DIAGNOSIS — R7309 Other abnormal glucose: Secondary | ICD-10-CM

## 2013-03-25 DIAGNOSIS — R739 Hyperglycemia, unspecified: Secondary | ICD-10-CM

## 2013-03-25 DIAGNOSIS — R1900 Intra-abdominal and pelvic swelling, mass and lump, unspecified site: Secondary | ICD-10-CM

## 2013-03-25 DIAGNOSIS — E785 Hyperlipidemia, unspecified: Secondary | ICD-10-CM

## 2013-03-25 DIAGNOSIS — E669 Obesity, unspecified: Secondary | ICD-10-CM

## 2013-03-25 DIAGNOSIS — Z0000 Encounter for general adult medical examination without abnormal findings: Secondary | ICD-10-CM

## 2013-03-25 LAB — HEMOGLOBIN A1C
HEMOGLOBIN A1C: 5.8 % — AB (ref <5.7)
MEAN PLASMA GLUCOSE: 120 mg/dL — AB (ref <117)

## 2013-03-25 LAB — CBC WITH DIFFERENTIAL/PLATELET
BASOS ABS: 0.1 10*3/uL (ref 0.0–0.1)
BASOS PCT: 1 % (ref 0–1)
Eosinophils Absolute: 0.2 10*3/uL (ref 0.0–0.7)
Eosinophils Relative: 2 % (ref 0–5)
HEMATOCRIT: 37.2 % (ref 36.0–46.0)
Hemoglobin: 12.2 g/dL (ref 12.0–15.0)
LYMPHS PCT: 29 % (ref 12–46)
Lymphs Abs: 2.2 10*3/uL (ref 0.7–4.0)
MCH: 29.8 pg (ref 26.0–34.0)
MCHC: 32.8 g/dL (ref 30.0–36.0)
MCV: 91 fL (ref 78.0–100.0)
Monocytes Absolute: 0.7 10*3/uL (ref 0.1–1.0)
Monocytes Relative: 8 % (ref 3–12)
NEUTROS ABS: 4.7 10*3/uL (ref 1.7–7.7)
NEUTROS PCT: 60 % (ref 43–77)
PLATELETS: 289 10*3/uL (ref 150–400)
RBC: 4.09 MIL/uL (ref 3.87–5.11)
RDW: 13.8 % (ref 11.5–15.5)
WBC: 7.8 10*3/uL (ref 4.0–10.5)

## 2013-03-25 LAB — LIPID PANEL
CHOLESTEROL: 165 mg/dL (ref 0–200)
HDL: 45 mg/dL (ref >39)
LDL CALC: 103 mg/dL — AB (ref 0–99)
Total CHOL/HDL Ratio: 3.7 Ratio
Triglycerides: 86 mg/dL (ref <150)
VLDL: 17 mg/dL (ref 0–40)

## 2013-03-25 LAB — BASIC METABOLIC PANEL
BUN: 16 mg/dL (ref 6–23)
CHLORIDE: 104 meq/L (ref 96–112)
CO2: 31 mEq/L (ref 19–32)
Calcium: 9.6 mg/dL (ref 8.4–10.5)
Creat: 0.69 mg/dL (ref 0.50–1.10)
Glucose, Bld: 125 mg/dL — ABNORMAL HIGH (ref 70–99)
POTASSIUM: 4.1 meq/L (ref 3.5–5.3)
Sodium: 140 mEq/L (ref 135–145)

## 2013-03-28 ENCOUNTER — Encounter: Payer: Self-pay | Admitting: Family Medicine

## 2013-03-28 ENCOUNTER — Ambulatory Visit (INDEPENDENT_AMBULATORY_CARE_PROVIDER_SITE_OTHER): Payer: Commercial Managed Care - PPO | Admitting: Family Medicine

## 2013-03-28 VITALS — BP 128/76 | HR 72 | Temp 99.2°F | Ht 63.5 in | Wt 203.5 lb

## 2013-03-28 DIAGNOSIS — Z0000 Encounter for general adult medical examination without abnormal findings: Secondary | ICD-10-CM

## 2013-03-28 DIAGNOSIS — R1900 Intra-abdominal and pelvic swelling, mass and lump, unspecified site: Secondary | ICD-10-CM

## 2013-03-28 DIAGNOSIS — I480 Paroxysmal atrial fibrillation: Secondary | ICD-10-CM

## 2013-03-28 DIAGNOSIS — R195 Other fecal abnormalities: Secondary | ICD-10-CM

## 2013-03-28 DIAGNOSIS — E785 Hyperlipidemia, unspecified: Secondary | ICD-10-CM

## 2013-03-28 DIAGNOSIS — I4891 Unspecified atrial fibrillation: Secondary | ICD-10-CM

## 2013-03-28 NOTE — Progress Notes (Signed)
Pre-visit discussion using our clinic review tool. No additional management support is needed unless otherwise documented below in the visit note.  

## 2013-03-28 NOTE — Assessment & Plan Note (Signed)
Continues to decline surgery/gyn onc eval.  Advised f/u with GYN for well woman exam.  Would be more prone to seeing female [place] provider.

## 2013-03-28 NOTE — Assessment & Plan Note (Addendum)
CHADS2VASC score = 1 today.  Restart aspirin.  Pt declines other anticoagulant.  Discussed risk of stroke off anticoagulant but seems to be acceptably low.

## 2013-03-28 NOTE — Progress Notes (Signed)
BP 128/76  Pulse 72  Temp(Src) 99.2 F (37.3 C) (Oral)  Ht 5' 3.5" (1.613 m)  Wt 203 lb 8 oz (92.307 kg)  BMI 35.48 kg/m2   CC: annual exam  Subjective:    Patient ID: Brandy Guerrero, female    DOB: 05-02-1950, 63 y.o.   MRN: [id number]  HPI: Brandy Guerrero is a 63 y.o. female presenting on 03/28/2013 with Annual Exam  Pt presents for annual exam today. Wt Readings from Last 3 Encounters:  03/28/13 203 lb 8 oz (92.307 kg)  07/21/12 201 lb 8 oz (91.4 kg)  05/05/12 195 lb (88.451 kg)  Body mass index is 35.48 kg/(m^2).  Pelvic mass - continues to decline further eval of pelvic mass. Denies new sxs. Aware of risk of low grade cancer as cause of mass. Due to see OBGYN. States would consider seeing female GYN onc, but declines referral today, states wants to discuss with her OBGYN.  H/o paroxy afib.  Still intermittently with afib flares.  Takes metoprolol prn.  Has been on coumadin in past. Self stopped. Prior saw Dr. Claiborne Billings as cardiologist.  Hiram Comber score = 1, 1.3% risk of stroke off coumadin.  Caffeine: none  Lives with husband, has grown children, outside pets (husband is Biomedical engineer)  Occupation: CNA at [employer], works 2nd shift, [employer] on weekends  Activity: starting regular walking - 3 mi about 2d/wk  Diet: good water, fruits/vegetables daily   Preventative:  Colon cancer screening - ifob positive (2014), s/p colonoscopy [name]) with diverticulosis, but no reason for bleed found.  rec rpt stool cards and if positive consider EGD.  Rpt stool cards last year negative. Well woman with [name] - pap and breast exam 05/2011 - due for repeat. Mammogram 09/2012 birads 1. Tdap - 2013 Flu shot - at work zostavax - will check with insurance  Relevant past medical, surgical, family and social history reviewed and updated. Allergies and medications reviewed and updated. Current Outpatient Prescriptions on File Prior to Visit  Medication Sig  . ACZONE 5 % topical gel as  directed.  . Betamethasone Valerate 0.12 % foam as directed.  . doxycycline (VIBRAMYCIN) 100 MG capsule Take 100 mg by mouth daily. For face  . ketoconazole (NIZORAL) 2 % shampoo as directed.  . metoprolol tartrate (LOPRESSOR) 25 MG tablet Take 1 tablet (25 mg total) by mouth 2 (two) times daily as needed. Heart fluttering  . NON FORMULARY Hair and nail vitamin  . simvastatin (ZOCOR) 40 MG tablet TAKE ONE TABLET BY MOUTH EVERY DAY AT SUPPER  . aspirin 81 MG tablet Take 81 mg by mouth daily.  Marland Kitchen FLUOCINOLONE ACETONIDE SCALP 0.01 % external oil as directed.   No current facility-administered medications on file prior to visit.    Review of Systems  Constitutional: Negative for fever, chills, activity change, appetite change, fatigue and unexpected weight change.  HENT: Negative for hearing loss.   Eyes: Negative for visual disturbance (chronic floater in right eye).  Respiratory: Negative for cough, chest tightness, shortness of breath and wheezing.   Cardiovascular: Negative for chest pain, palpitations and leg swelling.  Gastrointestinal: Negative for nausea, vomiting, abdominal pain, diarrhea, constipation, blood in stool and abdominal distention.  Genitourinary: Negative for hematuria and difficulty urinating.  Musculoskeletal: Negative for arthralgias (left knee), myalgias and neck pain.  Skin: Negative for rash.  Neurological: Negative for dizziness, seizures, syncope and headaches.  Hematological: Negative for adenopathy. Does not bruise/bleed easily.  Psychiatric/Behavioral: Negative for dysphoric mood. The  patient is not nervous/anxious.    Per HPI unless specifically indicated above    Objective:    BP 128/76  Pulse 72  Temp(Src) 99.2 F (37.3 C) (Oral)  Ht 5' 3.5" (1.613 m)  Wt 203 lb 8 oz (92.307 kg)  BMI 35.48 kg/m2  Physical Exam  Nursing note and vitals reviewed. Constitutional: She is oriented to person, place, and time. She appears well-developed and  well-nourished. No distress.  HENT:  Head: Normocephalic and atraumatic.  Right Ear: Hearing, tympanic membrane, external ear and ear canal normal.  Left Ear: Hearing, tympanic membrane, external ear and ear canal normal.  Nose: Nose normal.  Mouth/Throat: Uvula is midline, oropharynx is clear and moist and mucous membranes are normal. No oropharyngeal exudate, posterior oropharyngeal edema or posterior oropharyngeal erythema.  Eyes: Conjunctivae and EOM are normal. Pupils are equal, round, and reactive to light. No scleral icterus.  Neck: Normal range of motion. Neck supple. Carotid bruit is not present. No thyromegaly present.  Cardiovascular: Normal rate, regular rhythm, normal heart sounds and intact distal pulses.   No murmur heard. Pulses:      Radial pulses are 2+ on the right side, and 2+ on the left side.  Regular today  Pulmonary/Chest: Effort normal and breath sounds normal. No respiratory distress. She has no wheezes. She has no rales.  Abdominal: Soft. Bowel sounds are normal. She exhibits no distension and no mass. There is no tenderness. There is no rebound and no guarding.  Musculoskeletal: Normal range of motion. She exhibits no edema.  Lymphadenopathy:    She has no cervical adenopathy.  Neurological: She is alert and oriented to person, place, and time.  CN grossly intact, station and gait intact  Skin: Skin is warm and dry. No rash noted.  Psychiatric: She has a normal mood and affect. Her behavior is normal. Judgment and thought content normal.   Results for orders placed in visit on 03/25/13  LIPID PANEL      Result Value Ref Range   Cholesterol 165  0 - 200 mg/dL   Triglycerides 86  <150 mg/dL   HDL 45  >39 mg/dL   Total CHOL/HDL Ratio 3.7     VLDL 17  0 - 40 mg/dL   LDL Cholesterol 103 (*) 0 - 99 mg/dL  HEMOGLOBIN A1C      Result Value Ref Range   Hemoglobin A1C 5.8 (*) <5.7 %   Mean Plasma Glucose 120 (*) <117 mg/dL  CBC WITH DIFFERENTIAL      Result  Value Ref Range   WBC 7.8  4.0 - 10.5 K/uL   RBC 4.09  3.87 - 5.11 MIL/uL   Hemoglobin 12.2  12.0 - 15.0 g/dL   HCT 37.2  36.0 - 46.0 %   MCV 91.0  78.0 - 100.0 fL   MCH 29.8  26.0 - 34.0 pg   MCHC 32.8  30.0 - 36.0 g/dL   RDW 13.8  11.5 - 15.5 %   Platelets 289  150 - 400 K/uL   Neutrophils Relative % 60  43 - 77 %   Neutro Abs 4.7  1.7 - 7.7 K/uL   Lymphocytes Relative 29  12 - 46 %   Lymphs Abs 2.2  0.7 - 4.0 K/uL   Monocytes Relative 8  3 - 12 %   Monocytes Absolute 0.7  0.1 - 1.0 K/uL   Eosinophils Relative 2  0 - 5 %   Eosinophils Absolute 0.2  0.0 - 0.7 K/uL  Basophils Relative 1  0 - 1 %   Basophils Absolute 0.1  0.0 - 0.1 K/uL   Smear Review Criteria for review not met    BASIC METABOLIC PANEL      Result Value Ref Range   Sodium 140  135 - 145 mEq/L   Potassium 4.1  3.5 - 5.3 mEq/L   Chloride 104  96 - 112 mEq/L   CO2 31  19 - 32 mEq/L   Glucose, Bld 125 (*) 70 - 99 mg/dL   BUN 16  6 - 23 mg/dL   Creat 0.69  0.50 - 1.10 mg/dL   Calcium 9.6  8.4 - 10.5 mg/dL      Assessment & Plan:   Problem List Items Addressed This Visit   Healthcare maintenance - Primary     Preventative protocols reviewed and updated unless pt declined. Discussed healthy diet and lifestyle. Discussed iFOB in 1 year     Heme positive stool     Colonoscopy 2014 ok, rpt iFOB was negative.  Pt declines rpt iFOB today.  Consider rpt in future, if positive consider EGD.    HLD (hyperlipidemia)     Chronic, stable. Continue statin.  Off fish oil now.    Paroxysmal atrial fibrillation     CHADS2VASC score = 1 today.  Restart aspirin.  Pt declines other anticoagulant.  Discussed risk of stroke off anticoagulant but seems to be acceptably low.    Pelvic mass     Continues to decline surgery/gyn onc eval.  Advised f/u with GYN for well woman exam.  Would be more prone to seeing female [place] provider.        Follow up plan: Return in about 1 year (around 03/28/2014), or as needed, for annual  exam, prior fasting for blood work.

## 2013-03-28 NOTE — Patient Instructions (Signed)
Call your insurance about the shingles shot to see if it is covered or how much it would cost and where is cheaper (here or pharmacy).  If you want to receive here, call for nurse visit. Schedule well woman appointment with [name] as you're due. We will continue to keep an eye on blood sugars. Good to see you today, call us with questions.

## 2013-03-28 NOTE — Assessment & Plan Note (Signed)
Colonoscopy 2014 ok, rpt iFOB was negative.  Pt declines rpt iFOB today.  Consider rpt in future, if positive consider EGD.

## 2013-03-28 NOTE — Assessment & Plan Note (Signed)
Preventative protocols reviewed and updated unless pt declined. Discussed healthy diet and lifestyle. Discussed iFOB in 1 year

## 2013-03-28 NOTE — Assessment & Plan Note (Signed)
Chronic, stable. Continue statin.  Off fish oil now.

## 2013-03-30 ENCOUNTER — Telehealth: Payer: Self-pay

## 2013-03-30 MED ORDER — METOPROLOL TARTRATE 25 MG PO TABS: 25.0000 mg | ORAL_TABLET | Freq: Two times a day (BID) | ORAL | Status: DC | PRN

## 2013-03-30 NOTE — Telephone Encounter (Signed)
Spoke with patient and appt scheduled. 

## 2013-03-30 NOTE — Telephone Encounter (Signed)
Pt was seen on 03/28/13 and pt mentioned she had right shoulder blade ache that started 03/26/13. Pt said right shoulder blade is still hurting; pain level now is 6. Shoulder blade pain is sharp and continuous. Pt has non prod cough on and off. Pt has not checked fever.No CP,SOB,H/A or dizziness. [place] Pt request cb. Pt also request metoprolol refill; advised pt metoprol refill done.

## 2013-03-30 NOTE — Telephone Encounter (Signed)
I did not evaluate her for this.  Recommend she come in tomorrow for eval of this.

## 2013-03-31 ENCOUNTER — Ambulatory Visit (INDEPENDENT_AMBULATORY_CARE_PROVIDER_SITE_OTHER): Payer: Commercial Managed Care - PPO | Admitting: Family Medicine

## 2013-03-31 ENCOUNTER — Encounter: Payer: Self-pay | Admitting: Family Medicine

## 2013-03-31 VITALS — BP 118/68 | HR 84 | Temp 97.7°F | Wt 208.5 lb

## 2013-03-31 DIAGNOSIS — M25511 Pain in right shoulder: Secondary | ICD-10-CM

## 2013-03-31 DIAGNOSIS — M25519 Pain in unspecified shoulder: Secondary | ICD-10-CM

## 2013-03-31 MED ORDER — CYCLOBENZAPRINE HCL 10 MG PO TABS: 10.0000 mg | ORAL_TABLET | Freq: Two times a day (BID) | ORAL | Status: DC | PRN

## 2013-03-31 NOTE — Assessment & Plan Note (Signed)
Anticipate R subacromial bursitis as well as R trapezius muscle tightness/spasm. Provided with stretching exercises from [facility] pt advisor. Prescribed flexeril 10mg  bid prn - discussed sedation precautions. Recommended continued diclofenac as she has at home per orthopedist Discussed possible steroid shot if no improvement.

## 2013-03-31 NOTE — Progress Notes (Signed)
   BP 118/68  Pulse 84  Temp(Src) 97.7 F (36.5 C) (Oral)  Wt 208 lb 8 oz (94.575 kg)   CC: R shoulder pain  Subjective:    Patient ID: Brandy Guerrero, female    DOB: 26-Jun-1950, 63 y.o.   MRN: [id number]  HPI: Brandy Guerrero is a 63 y.o. female presenting on 03/31/2013 with Shoulder Pain  1 wk h/o R shoulder throbbing ache posteriorly.  No inciting factor.  Occasional arm pain down right arm. Denies neck pain, tingling/numbness. Denies inciting trauma/falls or injury. Hasn't tried med for this yet.  No h/o shoulder issues in the past. No chest pain, no cough or dyspnea.  Relevant past medical, surgical, family and social history reviewed and updated. Allergies and medications reviewed and updated. Current Outpatient Prescriptions on File Prior to Visit  Medication Sig  . ACZONE 5 % topical gel as directed.  Marland Kitchen aspirin 81 MG tablet Take 81 mg by mouth daily.  Marland Kitchen doxycycline (VIBRAMYCIN) 100 MG capsule Take 100 mg by mouth daily. For face  . metoprolol tartrate (LOPRESSOR) 25 MG tablet Take 1 tablet (25 mg total) by mouth 2 (two) times daily as needed. Heart fluttering  . NON FORMULARY Hair and nail vitamin  . simvastatin (ZOCOR) 40 MG tablet TAKE ONE TABLET BY MOUTH EVERY DAY AT SUPPER  . Betamethasone Valerate 0.12 % foam as directed.  Marland Kitchen FLUOCINOLONE ACETONIDE SCALP 0.01 % external oil as directed.  Marland Kitchen ketoconazole (NIZORAL) 2 % shampoo as directed.   No current facility-administered medications on file prior to visit.    Review of Systems Per HPI unless specifically indicated above    Objective:    BP 118/68  Pulse 84  Temp(Src) 97.7 F (36.5 C) (Oral)  Wt 208 lb 8 oz (94.575 kg)  Physical Exam  Nursing note and vitals reviewed. Constitutional: She appears well-developed and well-nourished. No distress.  Neck: Normal range of motion. Neck supple.  Musculoskeletal: She exhibits no edema.  Tender midline spine upper thoracic region FROM at spine and shoulders Tender at  R belly of trap Tender at R subacromial bursa Mild discomfort with impingement test, testing SITS against resistance, tender with empty can sign No pain with rotation of humeral head in [place] joint. 5/5 strength bilateral upper extremities.  Sensation intact. 2+ rad pulses       Assessment & Plan:   Problem List Items Addressed This Visit   Right shoulder pain - Primary     Anticipate R subacromial bursitis as well as R trapezius muscle tightness/spasm. Provided with stretching exercises from [facility] pt advisor. Prescribed flexeril 10mg  bid prn - discussed sedation precautions. Recommended continued diclofenac as she has at home per orthopedist Discussed possible steroid shot if no improvement.        Follow up plan: Return if symptoms worsen or fail to improve.

## 2013-03-31 NOTE — Patient Instructions (Signed)
I think you have shoulder bursitis and spasm/tightness of trapezius muscle on right. Continue diclofenac per Dr. Jacqualyn Posey.  This will help shoulder.  Don't take with aleve or advil or ibuprofen as they're all in the same family. Start muscle relaxant called flexeril - caution it may make you sleepy. Do stretching exercises provided today. Continue ice or a heating pad (whichever soothes better). Let me know if not improving with above.

## 2013-03-31 NOTE — Progress Notes (Signed)
Pre-visit discussion using our clinic review tool. No additional management support is needed unless otherwise documented below in the visit note.  

## 2013-08-15 ENCOUNTER — Ambulatory Visit (INDEPENDENT_AMBULATORY_CARE_PROVIDER_SITE_OTHER): Payer: Commercial Managed Care - PPO | Admitting: Family Medicine

## 2013-08-15 ENCOUNTER — Encounter: Payer: Self-pay | Admitting: Family Medicine

## 2013-08-15 VITALS — BP 114/62 | HR 68 | Temp 98.0°F | Wt 206.8 lb

## 2013-08-15 DIAGNOSIS — E785 Hyperlipidemia, unspecified: Secondary | ICD-10-CM

## 2013-08-15 DIAGNOSIS — R7303 Prediabetes: Secondary | ICD-10-CM

## 2013-08-15 DIAGNOSIS — R7309 Other abnormal glucose: Secondary | ICD-10-CM

## 2013-08-15 DIAGNOSIS — E669 Obesity, unspecified: Secondary | ICD-10-CM

## 2013-08-15 NOTE — Progress Notes (Signed)
Pre visit review using our clinic review tool, if applicable. No additional management support is needed unless otherwise documented below in the visit note. 

## 2013-08-15 NOTE — Assessment & Plan Note (Signed)
As of last A1c. Reviewed with patient.

## 2013-08-15 NOTE — Progress Notes (Signed)
BP 114/62  Pulse 68  Temp(Src) 98 F (36.7 C) (Oral)  Wt 206 lb 12 oz (93.781 kg)   CC: discuss weight loss.  Subjective:    Patient ID: Brandy Guerrero, female    DOB: 1950/02/23, 63 y.o.   MRN: [id number]  HPI: Brandy Guerrero is a 63 y.o. female presenting on 08/15/2013 for Follow-up   Presents today to discuss depression and fatigue/lack of energy, some hot flashes.  Trying to lose weight - changed diet, stopped fried foods, taking fruits to eat at work, drinking more water. Avoiding breads.  Activity - no regular exercise 2/2 heat. Does have indoor gym with treadmill at work. Interested in referral to nutritionist. Prefers [facility] referral.   Wt Readings from Last 3 Encounters:  08/15/13 206 lb 12 oz (93.781 kg)  03/31/13 208 lb 8 oz (94.575 kg)  03/28/13 203 lb 8 oz (92.307 kg)  Body mass index is 36.05 kg/(m^2).  H/o parox afib on metoprolol prn, h/o HLD. Known pelvic mass, continues to refuse resection. Aware if she decides to undergo procedure, may make appt with GYN. fmhx DM and HTN.  Past Medical History  Diagnosis Date  . Panic attacks   . HLD (hyperlipidemia)   . Acne     dermatology - on doxy 110m daily  . Pelvic mass     declines resection  . Obesity   . History of atrial fibrillation 05/2005    h/o parox Afib, nl cardiolite/echo, no prolonged episodes.  metoprolol prn ([facility]ings  . Anemia   . Positive occult stool blood test 2014    s/p normal colonoscopy, rpt stool cards negative  . Prediabetes      Relevant past medical, surgical, family and social history reviewed and updated as indicated.  Allergies and medications reviewed and updated. Current Outpatient Prescriptions on File Prior to Visit  Medication Sig  . ACZONE 5 % topical gel as directed.  . Betamethasone Valerate 0.12 % foam as directed.  . cyclobenzaprine (FLEXERIL) 10 MG tablet Take 1 tablet (10 mg total) by mouth 2 (two) times daily as needed for muscle spasms (sedation precautions).  .  diclofenac (VOLTAREN) 75 MG EC tablet Take 75 mg by mouth 2 (two) times daily.  .Marland Kitchendoxycycline (VIBRAMYCIN) 100 MG capsule Take 100 mg by mouth daily. For face  . FLUOCINOLONE ACETONIDE SCALP 0.01 % external oil as directed.  .Marland Kitchenketoconazole (NIZORAL) 2 % shampoo as directed.  . metoprolol tartrate (LOPRESSOR) 25 MG tablet Take 1 tablet (25 mg total) by mouth 2 (two) times daily as needed. Heart fluttering  . NON FORMULARY Hair and nail vitamin  . simvastatin (ZOCOR) 40 MG tablet TAKE ONE TABLET BY MOUTH EVERY DAY AT SUPPER  . aspirin 81 MG tablet Take 81 mg by mouth daily.   No current facility-administered medications on file prior to visit.    Review of Systems Per HPI unless specifically indicated above    Objective:    BP 114/62  Pulse 68  Temp(Src) 98 F (36.7 C) (Oral)  Wt 206 lb 12 oz (93.781 kg)  Physical Exam  Nursing note and vitals reviewed. Constitutional: She appears well-developed and well-nourished. No distress.  Psychiatric: She has a normal mood and affect.   Results for orders placed in visit on 03/25/13  LIPID PANEL      Result Value Ref Range   Cholesterol 165  0 - 200 mg/dL   Triglycerides 86  <150 mg/dL   HDL 45  >39 mg/dL  Total CHOL/HDL Ratio 3.7     VLDL 17  0 - 40 mg/dL   LDL Cholesterol 103 (*) 0 - 99 mg/dL  HEMOGLOBIN A1C      Result Value Ref Range   Hemoglobin A1C 5.8 (*) <5.7 %   Mean Plasma Glucose 120 (*) <117 mg/dL  CBC WITH DIFFERENTIAL      Result Value Ref Range   WBC 7.8  4.0 - 10.5 K/uL   RBC 4.09  3.87 - 5.11 MIL/uL   Hemoglobin 12.2  12.0 - 15.0 g/dL   HCT 37.2  36.0 - 46.0 %   MCV 91.0  78.0 - 100.0 fL   MCH 29.8  26.0 - 34.0 pg   MCHC 32.8  30.0 - 36.0 g/dL   RDW 13.8  11.5 - 15.5 %   Platelets 289  150 - 400 K/uL   Neutrophils Relative % 60  43 - 77 %   Neutro Abs 4.7  1.7 - 7.7 K/uL   Lymphocytes Relative 29  12 - 46 %   Lymphs Abs 2.2  0.7 - 4.0 K/uL   Monocytes Relative 8  3 - 12 %   Monocytes Absolute 0.7  0.1 -  1.0 K/uL   Eosinophils Relative 2  0 - 5 %   Eosinophils Absolute 0.2  0.0 - 0.7 K/uL   Basophils Relative 1  0 - 1 %   Basophils Absolute 0.1  0.0 - 0.1 K/uL   Smear Review Criteria for review not met    BASIC METABOLIC PANEL      Result Value Ref Range   Sodium 140  135 - 145 mEq/L   Potassium 4.1  3.5 - 5.3 mEq/L   Chloride 104  96 - 112 mEq/L   CO2 31  19 - 32 mEq/L   Glucose, Bld 125 (*) 70 - 99 mg/dL   BUN 16  6 - 23 mg/dL   Creat 0.69  0.50 - 1.10 mg/dL   Calcium 9.6  8.4 - 10.5 mg/dL      Assessment & Plan:   Problem List Items Addressed This Visit   Prediabetes - Primary     As of last A1c. Reviewed with patient.    Obesity     Body mass index is 36.05 kg/(m^2).  H/o prediabetes as of last A1c, as well as HLD.    HLD (hyperlipidemia)     Reviewed relation of weight on HLD.        Follow up plan: Return as needed, for follow up visit.

## 2013-08-15 NOTE — Assessment & Plan Note (Signed)
Reviewed relation of weight on HLD.

## 2013-08-15 NOTE — Patient Instructions (Addendum)
Pass by Marion's office for referral to nutritionist.  Goal weekly exercise 188min/wk - moderate intensity aerobic exercise.  Start slowly - 10-15 min a few times a week and slowly build up. Work on more protein in each meal. Good to see you today, call us with questions. Return in 1-2 months for follow up visit.

## 2013-08-15 NOTE — Assessment & Plan Note (Signed)
Body mass index is 36.05 kg/(m^2).  H/o prediabetes as of last A1c, as well as HLD.

## 2013-09-20 ENCOUNTER — Ambulatory Visit: Payer: Self-pay | Admitting: Family Medicine

## 2013-09-23 ENCOUNTER — Other Ambulatory Visit: Payer: Self-pay

## 2013-09-23 DIAGNOSIS — Z1231 Encounter for screening mammogram for malignant neoplasm of breast: Secondary | ICD-10-CM

## 2013-10-10 ENCOUNTER — Ambulatory Visit: Payer: Commercial Managed Care - PPO

## 2013-10-11 ENCOUNTER — Ambulatory Visit: Payer: Self-pay | Admitting: Family Medicine

## 2013-10-19 ENCOUNTER — Ambulatory Visit: Payer: Commercial Managed Care - PPO | Admitting: Family Medicine

## 2013-11-07 ENCOUNTER — Ambulatory Visit: Payer: Commercial Managed Care - PPO | Admitting: Family Medicine

## 2013-11-10 ENCOUNTER — Ambulatory Visit (INDEPENDENT_AMBULATORY_CARE_PROVIDER_SITE_OTHER): Payer: Commercial Managed Care - PPO | Admitting: Family Medicine

## 2013-11-10 ENCOUNTER — Encounter: Payer: Self-pay | Admitting: Family Medicine

## 2013-11-10 ENCOUNTER — Encounter (INDEPENDENT_AMBULATORY_CARE_PROVIDER_SITE_OTHER): Payer: Self-pay

## 2013-11-10 ENCOUNTER — Ambulatory Visit: Payer: Self-pay | Admitting: Family Medicine

## 2013-11-10 VITALS — BP 124/68 | HR 62 | Temp 98.1°F | Wt 209.5 lb

## 2013-11-10 DIAGNOSIS — E669 Obesity, unspecified: Secondary | ICD-10-CM

## 2013-11-10 DIAGNOSIS — E785 Hyperlipidemia, unspecified: Secondary | ICD-10-CM

## 2013-11-10 DIAGNOSIS — R7309 Other abnormal glucose: Secondary | ICD-10-CM

## 2013-11-10 DIAGNOSIS — R7303 Prediabetes: Secondary | ICD-10-CM

## 2013-11-10 MED ORDER — LORCASERIN HCL 10 MG PO TABS: 10.0000 mg | ORAL_TABLET | Freq: Two times a day (BID) | ORAL | Status: DC

## 2013-11-10 NOTE — Progress Notes (Addendum)
BP 124/68 mmHg  Pulse 62  Temp(Src) 98.1 F (36.7 C) (Oral)  Wt 209 lb 8 oz (95.029 kg)  SpO2 97%   CC: f/u weight  Subjective:    Patient ID: Brandy Guerrero, female    DOB: 05-Jul-1950, 63 y.o.   MRN: [id number]  HPI: Brandy Guerrero is a 63 y.o. female presenting on 11/10/2013 for Follow-up   Trying to lose weight - changed diet, stopped fried foods, taking fruits to eat at work, drinking more water. Eating wheat bread. Using less fat back for cooking, more olive oil. Less eating out.  Activity - no regular exercise. Does have indoor gym with treadmill at work.  She had joined weight watchers in the past but had trouble with finding schedule. May look into lean cuisines.   Last visit we recommended increased protein in diet as well as slow increase of aerobic exercise into routine. Increased stress recently. She still did not walk because of heat.  She did see nutritionist. She thinks she will do better with a group program.   Wt Readings from Last 3 Encounters:  01/20/14 202 lb (91.627 kg)  11/28/13 206 lb 4 oz (93.554 kg)  11/10/13 209 lb 8 oz (95.029 kg)   Body mass index is 36.52 kg/(m^2).  H/o parox afib on metoprolol prn, h/o HLD.  Known pelvic mass, continues to refuse resection. Aware if she decides to undergo procedure, may make appt with GYN.  Relevant past medical, surgical, family and social history reviewed and updated as indicated.  Allergies and medications reviewed and updated. Current Outpatient Prescriptions on File Prior to Visit  Medication Sig  . Betamethasone Valerate 0.12 % foam as directed.  . cyclobenzaprine (FLEXERIL) 10 MG tablet Take 1 tablet (10 mg total) by mouth 2 (two) times daily as needed for muscle spasms (sedation precautions).  . diclofenac (VOLTAREN) 75 MG EC tablet Take 75 mg by mouth 2 (two) times daily.  Marland Kitchen doxycycline (VIBRAMYCIN) 100 MG capsule Take 100 mg by mouth daily. For face  . FLUOCINOLONE ACETONIDE SCALP 0.01 % external oil  as directed.  Marland Kitchen ketoconazole (NIZORAL) 2 % shampoo as directed.  . metoprolol tartrate (LOPRESSOR) 25 MG tablet Take 1 tablet (25 mg total) by mouth 2 (two) times daily as needed. Heart fluttering  . NON FORMULARY Hair and nail vitamin   No current facility-administered medications on file prior to visit.   Past Medical History  Diagnosis Date  . Panic attacks   . HLD (hyperlipidemia)   . Acne     dermatology - on doxy 100mg  daily  . Pelvic mass     declines resection  . Obesity   . History of atrial fibrillation 05/2005    h/o parox Afib, nl cardiolite/echo, no prolonged episodes.  metoprolol prn [facility])  . Anemia   . Positive occult stool blood test 2014    s/p normal colonoscopy, rpt stool cards negative  . Prediabetes     Past Surgical History  Procedure Laterality Date  . Abdominal hysterectomy  1990s    for fibroids, has one ovary remaining ([place])  . Colonoscopy  01/2012    mod diverticulosis, rec rpt stool cards and if + rec EGD (returned negative), rpt colonoscopy 10 yrs   Review of Systems Per HPI unless specifically indicated above    Objective:    BP 124/68 mmHg  Pulse 62  Temp(Src) 98.1 F (36.7 C) (Oral)  Wt 209 lb 8 oz (95.029 kg)  SpO2 97%  Physical Exam  Nursing note and vitals reviewed. Constitutional: She appears well-developed and well-nourished. No distress.  HENT:  Mouth/Throat: Oropharynx is clear and moist. No oropharyngeal exudate.  Cardiovascular: Normal rate, regular rhythm, normal heart sounds and intact distal pulses.   No murmur heard. Pulmonary/Chest: Effort normal and breath sounds normal. No respiratory distress. She has no wheezes. She has no rales.  Psychiatric: She has a normal mood and affect.       Assessment & Plan:   Problem List Items Addressed This Visit    Prediabetes    Reviewed benefits of weight loss to help control this dx.      Obesity - Primary    Reviewed healthy diet/lifestyle changes. Discussed  bariatric medications - will try belviq - discussed side effects to monitor for. 15d and 30d prescriptions provided for patient. H/o prediabetes, HLD. Body mass index is 36.52 kg/(m^2).      HLD (hyperlipidemia)    Reviewed benefits of weight loss to help control this dx.          Follow up plan: Return in about 2 months (around 01/10/2014), or if symptoms worsen or fail to improve, for follow up visit.

## 2013-11-10 NOTE — Progress Notes (Signed)
Pre visit review using our clinic review tool, if applicable. No additional management support is needed unless otherwise documented below in the visit note. 

## 2013-11-10 NOTE — Patient Instructions (Addendum)
Ask nutritionist for other group program options available in [place]. Let's try belviq. 15 day supply and prescription sent to pharmacy. Flu shot at [facility] - let us know when you receive to update your chart. Good to see you today, call us with quesitons. Call your insurance about the shingles shot to see if it is covered or how much it would cost and where is cheaper (here or pharmacy).  If you want to receive here, call for nurse visit.

## 2013-11-10 NOTE — Assessment & Plan Note (Signed)
Reviewed healthy diet/lifestyle changes. Discussed bariatric medications - will try belviq - discussed side effects to monitor for. 15d and 30d prescriptions provided for patient. H/o prediabetes, HLD. Body mass index is 36.52 kg/(m^2).

## 2013-11-18 ENCOUNTER — Telehealth: Payer: Self-pay

## 2013-11-18 NOTE — Telephone Encounter (Signed)
Pt left v/m requesting status of form for Belviq that was sent to Dr Darnell Level from [facility] rd pt said form was to determine why Belviq was prescribed for pt. Pt request cb with status of form.

## 2013-11-22 NOTE — Telephone Encounter (Signed)
Still awaiting determination. Message left notifying patient.

## 2013-11-22 NOTE — Telephone Encounter (Signed)
Pt called. I advised her of Kim's comments. Pt request call back.  [phone]

## 2013-11-22 NOTE — Telephone Encounter (Signed)
Spoke with patient and advised still awaiting determination from insurance.

## 2013-11-23 NOTE — Telephone Encounter (Addendum)
Belviq denied. Not covered under plan at all. Patient notified and has discount card and will use that.

## 2013-11-28 ENCOUNTER — Ambulatory Visit (INDEPENDENT_AMBULATORY_CARE_PROVIDER_SITE_OTHER): Payer: Commercial Managed Care - PPO | Admitting: Family Medicine

## 2013-11-28 ENCOUNTER — Encounter: Payer: Self-pay | Admitting: Family Medicine

## 2013-11-28 ENCOUNTER — Telehealth: Payer: Self-pay

## 2013-11-28 VITALS — BP 118/76 | HR 100 | Temp 98.4°F | Wt 206.2 lb

## 2013-11-28 DIAGNOSIS — J209 Acute bronchitis, unspecified: Secondary | ICD-10-CM

## 2013-11-28 MED ORDER — HYDROCOD POLST-CHLORPHEN POLST 10-8 MG/5ML PO LQCR: 5.0000 mL | Freq: Every evening | ORAL | Status: DC | PRN

## 2013-11-28 NOTE — Progress Notes (Signed)
BP 118/76  Pulse 100  Temp(Src) 98.4 F (36.9 C) (Oral)  Wt 206 lb 4 oz (93.554 kg)  SpO2 98%   CC: cough  Subjective:    Patient ID: Brandy Guerrero, female    DOB: October 10, 1950, 63 y.o.   MRN: [id number]  HPI: Brandy Guerrero is a 63 y.o. female presenting on 11/28/2013 for URI   5d h/o cold sxs, started in head, ST, progressively worsening over weekend. In bed all weekend. Glands feel swollen, chest feels tight, cough productive of green sputum. Wheezing last night. Ringing in ears. +PNdrainage.  Using throat spray, robitussin, mucinex.  Out of work 2/2 illness.  No smokers at home.  No h/o asthma  No fevers/chills but felt feverish, tooth pain. Nurse aid for elderly.   Relevant past medical, surgical, family and social history reviewed and updated as indicated.  Allergies and medications reviewed and updated. Current Outpatient Prescriptions on File Prior to Visit  Medication Sig  . ACZONE 5 % topical gel as directed.  . Betamethasone Valerate 0.12 % foam as directed.  . diclofenac (VOLTAREN) 75 MG EC tablet Take 75 mg by mouth 2 (two) times daily.  Marland Kitchen doxycycline (VIBRAMYCIN) 100 MG capsule Take 100 mg by mouth daily. For face  . FLUOCINOLONE ACETONIDE SCALP 0.01 % external oil as directed.  Marland Kitchen ketoconazole (NIZORAL) 2 % shampoo as directed.  . Lorcaserin HCl (BELVIQ) 10 MG TABS Take 10 mg by mouth 2 (two) times daily.  . metoprolol tartrate (LOPRESSOR) 25 MG tablet Take 1 tablet (25 mg total) by mouth 2 (two) times daily as needed. Heart fluttering  . NON FORMULARY Hair and nail vitamin  . simvastatin (ZOCOR) 40 MG tablet TAKE ONE TABLET BY MOUTH EVERY DAY AT SUPPER  . cyclobenzaprine (FLEXERIL) 10 MG tablet Take 1 tablet (10 mg total) by mouth 2 (two) times daily as needed for muscle spasms (sedation precautions).   No current facility-administered medications on file prior to visit.    Review of Systems Per HPI unless specifically indicated above    Objective:    BP 118/76  Pulse 100  Temp(Src) 98.4 F (36.9 C) (Oral)  Wt 206 lb 4 oz (93.554 kg)  SpO2 98%  Physical Exam  Nursing note and vitals reviewed. Constitutional: She appears well-developed and well-nourished. No distress.  HENT:  Head: Normocephalic and atraumatic.  Right Ear: Hearing, tympanic membrane, external ear and ear canal normal.  Left Ear: Hearing, external ear and ear canal normal.  Nose: Mucosal edema (pallor present) and rhinorrhea present. Right sinus exhibits no maxillary sinus tenderness and no frontal sinus tenderness. Left sinus exhibits no maxillary sinus tenderness and no frontal sinus tenderness.  Mouth/Throat: Uvula is midline, oropharynx is clear and moist and mucous membranes are normal. No oropharyngeal exudate, posterior oropharyngeal edema, posterior oropharyngeal erythema or tonsillar abscesses.  L TM covered by cerumen  Eyes: Conjunctivae and EOM are normal. Pupils are equal, round, and reactive to light. No scleral icterus.  Neck: Normal range of motion. Neck supple.  Cardiovascular: Normal rate, regular rhythm, normal heart sounds and intact distal pulses.   No murmur heard. Pulmonary/Chest: Effort normal and breath sounds normal. No respiratory distress. She has no wheezes. She has no rales.  Overall clear  Lymphadenopathy:    She has no cervical adenopathy.  Skin: Skin is warm and dry. No rash noted.       Assessment & Plan:   Problem List Items Addressed This Visit   Acute bronchitis -  Primary     Anticipate viral given short duration. Supportive care as per instructions - tussionex for cough (codeine cough syrup ineffective last time (07/2012)) May continue mucinex, push fluids and rest. Update if not improving as expected, worsening sxs, or new fever >101 - would then consider zpack to cover bactiral bronchitis        Follow up plan: Return if symptoms worsen or fail to improve.

## 2013-11-28 NOTE — Progress Notes (Signed)
Pre visit review using our clinic review tool, if applicable. No additional management support is needed unless otherwise documented below in the visit note. 

## 2013-11-28 NOTE — Patient Instructions (Addendum)
I think you have bronchitis, but likely viral Start taking ibuprofen 600mg  three times a day with meals for next 2-3 days. Push fluids and rest. May continue mucinex during the day with a big glass of water. Use tussionex for cough at night time. Out of work for next 3 days. If not improving as expected, worsening productive cough or fever >101 please let me know.

## 2013-11-28 NOTE — Assessment & Plan Note (Signed)
Anticipate viral given short duration. Supportive care as per instructions - tussionex for cough (codeine cough syrup ineffective last time (07/2012)) May continue mucinex, push fluids and rest. Update if not improving as expected, worsening sxs, or new fever >101 - would then consider zpack to cover bactiral bronchitis

## 2013-11-28 NOTE — Telephone Encounter (Signed)
Pt was seen earlier today and pt went to pharmacy but ibuprofen 600 mg not there; advised pt to take OTC ibuprofen 200 mg taking 3 pills(which would equal 600 mg) tid with meals for 2-3 days. Pt will cb if symptoms do not clear.pt voiced understanding.

## 2013-12-02 ENCOUNTER — Ambulatory Visit: Payer: Commercial Managed Care - PPO

## 2013-12-12 ENCOUNTER — Encounter: Payer: Self-pay | Admitting: Family Medicine

## 2013-12-23 ENCOUNTER — Encounter (INDEPENDENT_AMBULATORY_CARE_PROVIDER_SITE_OTHER): Payer: Self-pay

## 2013-12-23 ENCOUNTER — Ambulatory Visit
Admission: RE | Admit: 2013-12-23 | Discharge: 2013-12-23 | Disposition: A | Discharge status: Home / Self Care | Payer: Commercial Managed Care - PPO | Source: Ambulatory Visit

## 2013-12-23 DIAGNOSIS — Z1231 Encounter for screening mammogram for malignant neoplasm of breast: Secondary | ICD-10-CM

## 2014-01-11 ENCOUNTER — Ambulatory Visit: Payer: Commercial Managed Care - PPO | Admitting: Family Medicine

## 2014-01-20 ENCOUNTER — Ambulatory Visit (INDEPENDENT_AMBULATORY_CARE_PROVIDER_SITE_OTHER): Payer: Commercial Managed Care - PPO | Admitting: Family Medicine

## 2014-01-20 ENCOUNTER — Encounter: Payer: Self-pay | Admitting: Family Medicine

## 2014-01-20 VITALS — BP 118/64 | HR 72 | Temp 98.4°F | Wt 202.0 lb

## 2014-01-20 DIAGNOSIS — E669 Obesity, unspecified: Secondary | ICD-10-CM

## 2014-01-20 MED ORDER — LORCASERIN HCL 10 MG PO TABS: 10.0000 mg | ORAL_TABLET | Freq: Two times a day (BID) | ORAL | Status: DC

## 2014-01-20 NOTE — Assessment & Plan Note (Addendum)
7lb weight loss noted over last 2 months - congratulated on healthy lifestyle changes.  Could improve increased activity incorporated into routine. Refilled belviq x 3 months and RTC 3 mo f/u.

## 2014-01-20 NOTE — Progress Notes (Signed)
BP 118/64 mmHg  Pulse 72  Temp(Src) 98.4 F (36.9 C) (Oral)  Wt 202 lb (91.627 kg)   CC: f/u visit  Subjective:    Patient ID: Brandy Guerrero, female    DOB: 11-28-1950, 63 y.o.   MRN: [id number]  HPI: STASSI FADELY is a 63 y.o. female presenting on 01/20/2014 for Follow-up   Trying to lose weight - changed diet, stopped fried foods, taking fruits to eat at work, drinking more water. Eating wheat bread. Using less fat back for cooking, more olive oil. Less eating out. Last visit we started belviq, pt tolerating well but only taking once daily. Denies headaches or GI upset. 7lb weight loss noted. Eating a fruit at bedtime, making healthy diet and activity changes.  Body mass index is 35.22 kg/(m^2). Lab Results  Component Value Date   HGBA1C 5.8* 03/25/2013    Activity - no regular exercise. Does have indoor gym with treadmill at work. increased work hours so has not been able to increase regular exercise.   H/o parox afib on metoprolol prn, HLD. Known pelvic mass, continues to refuse resection. Aware if she decides to undergo procedure, may make appt with GYN.  Relevant past medical, surgical, family and social history reviewed and updated as indicated. Interim medical history since our last visit reviewed. Allergies and medications reviewed and updated.  Current Outpatient Prescriptions on File Prior to Visit  Medication Sig  . Betamethasone Valerate 0.12 % foam as directed.  . cyclobenzaprine (FLEXERIL) 10 MG tablet Take 1 tablet (10 mg total) by mouth 2 (two) times daily as needed for muscle spasms (sedation precautions).  . diclofenac (VOLTAREN) 75 MG EC tablet Take 75 mg by mouth 2 (two) times daily.  Marland Kitchen doxycycline (VIBRAMYCIN) 100 MG capsule Take 100 mg by mouth daily. For face  . FLUOCINOLONE ACETONIDE SCALP 0.01 % external oil as directed.  Marland Kitchen ketoconazole (NIZORAL) 2 % shampoo as directed.  . metoprolol tartrate (LOPRESSOR) 25 MG tablet Take 1 tablet (25 mg total)  by mouth 2 (two) times daily as needed. Heart fluttering  . NON FORMULARY Hair and nail vitamin  . simvastatin (ZOCOR) 40 MG tablet TAKE ONE TABLET BY MOUTH EVERY DAY AT SUPPER   No current facility-administered medications on file prior to visit.    Review of Systems Per HPI unless specifically indicated above     Objective:    BP 118/64 mmHg  Pulse 72  Temp(Src) 98.4 F (36.9 C) (Oral)  Wt 202 lb (91.627 kg)  Wt Readings from Last 3 Encounters:  01/20/14 202 lb (91.627 kg)  11/28/13 206 lb 4 oz (93.554 kg)  11/10/13 209 lb 8 oz (95.029 kg)    Physical Exam  Constitutional: She appears well-developed and well-nourished. No distress.  HENT:  Mouth/Throat: Oropharynx is clear and moist. No oropharyngeal exudate.  Neck: Normal range of motion. Neck supple. No thyromegaly present.  Cardiovascular: Normal rate, regular rhythm, normal heart sounds and intact distal pulses.   No murmur heard. Pulmonary/Chest: Effort normal and breath sounds normal. No respiratory distress. She has no wheezes. She has no rales.  Musculoskeletal: She exhibits no edema.  Lymphadenopathy:    She has no cervical adenopathy.  Skin: Skin is warm and dry. No rash noted.  Psychiatric: She has a normal mood and affect.  Nursing note and vitals reviewed.  Results for orders placed or performed in visit on 03/25/13  Lipid panel  Result Value Ref Range   Cholesterol 165 0 - 200  mg/dL   Triglycerides 86 <150 mg/dL   HDL 45 >39 mg/dL   Total CHOL/HDL Ratio 3.7 Ratio   VLDL 17 0 - 40 mg/dL   LDL Cholesterol 103 (H) 0 - 99 mg/dL  Hemoglobin A1c  Result Value Ref Range   Hgb A1c MFr Bld 5.8 (H) <5.7 %   Mean Plasma Glucose 120 (H) <117 mg/dL  CBC with Differential  Result Value Ref Range   WBC 7.8 4.0 - 10.5 K/uL   RBC 4.09 3.87 - 5.11 MIL/uL   Hemoglobin 12.2 12.0 - 15.0 g/dL   HCT 37.2 36.0 - 46.0 %   MCV 91.0 78.0 - 100.0 fL   MCH 29.8 26.0 - 34.0 pg   MCHC 32.8 30.0 - 36.0 g/dL   RDW 13.8 11.5  - 15.5 %   Platelets 289 150 - 400 K/uL   Neutrophils Relative % 60 43 - 77 %   Neutro Abs 4.7 1.7 - 7.7 K/uL   Lymphocytes Relative 29 12 - 46 %   Lymphs Abs 2.2 0.7 - 4.0 K/uL   Monocytes Relative 8 3 - 12 %   Monocytes Absolute 0.7 0.1 - 1.0 K/uL   Eosinophils Relative 2 0 - 5 %   Eosinophils Absolute 0.2 0.0 - 0.7 K/uL   Basophils Relative 1 0 - 1 %   Basophils Absolute 0.1 0.0 - 0.1 K/uL   Smear Review Criteria for review not met   Basic metabolic panel  Result Value Ref Range   Sodium 140 135 - 145 mEq/L   Potassium 4.1 3.5 - 5.3 mEq/L   Chloride 104 96 - 112 mEq/L   CO2 31 19 - 32 mEq/L   Glucose, Bld 125 (H) 70 - 99 mg/dL   BUN 16 6 - 23 mg/dL   Creat 0.69 0.50 - 1.10 mg/dL   Calcium 9.6 8.4 - 10.5 mg/dL      Assessment & Plan:   Problem List Items Addressed This Visit    Obesity - Primary    7lb weight loss noted over last 2 months - congratulated on healthy lifestyle changes.  Could improve increased activity incorporated into routine. Refilled belviq x 3 months and RTC 3 mo f/u.    Relevant Medications      Lorcaserin HCl (BELVIQ) 10 MG TABS       Follow up plan: Return in about 3 months (around 04/21/2014), or as needed, for follow up visit.

## 2014-01-20 NOTE — Progress Notes (Signed)
Pre visit review using our clinic review tool, if applicable. No additional management support is needed unless otherwise documented below in the visit note. 

## 2014-01-20 NOTE — Patient Instructions (Signed)
I've refilled belviq. Continue healthy diet changes, work on increased regular activity in routine. Return in 3 months for follow up

## 2014-02-10 ENCOUNTER — Other Ambulatory Visit: Payer: Self-pay | Admitting: Family Medicine

## 2014-02-16 ENCOUNTER — Telehealth: Payer: Self-pay | Admitting: *Deleted

## 2014-02-16 NOTE — Telephone Encounter (Signed)
Received a fax from pharmacy for refill of Relafen 750mg  2 tabs qd with food. Med was originally prescribed by Dr. Elsie Saas, but his name was crossed out by pharmacy and replaced with yours on the request. Med was last filled on 11/29/13 for #60. Relafen was not on med list, so I added it. Are you ok with refilling, or do you want it forwarded to Dr. Noemi Chapel?

## 2014-02-17 MED ORDER — NABUMETONE 750 MG PO TABS: 750.0000 mg | ORAL_TABLET | Freq: Two times a day (BID) | ORAL | Status: DC | PRN

## 2014-02-17 NOTE — Telephone Encounter (Signed)
We can refill. Sent in.

## 2014-03-17 NOTE — Assessment & Plan Note (Signed)
Reviewed benefits of weight loss to help control this dx.

## 2014-04-21 ENCOUNTER — Ambulatory Visit: Payer: Commercial Managed Care - PPO | Admitting: Family Medicine

## 2014-04-28 ENCOUNTER — Telehealth: Payer: Self-pay | Admitting: Family Medicine

## 2014-04-28 ENCOUNTER — Encounter: Payer: Self-pay | Admitting: Family Medicine

## 2014-04-28 ENCOUNTER — Ambulatory Visit (INDEPENDENT_AMBULATORY_CARE_PROVIDER_SITE_OTHER): Payer: Commercial Managed Care - PPO | Admitting: Family Medicine

## 2014-04-28 VITALS — BP 118/60 | HR 80 | Temp 98.1°F | Wt 196.2 lb

## 2014-04-28 DIAGNOSIS — E669 Obesity, unspecified: Secondary | ICD-10-CM

## 2014-04-28 DIAGNOSIS — E785 Hyperlipidemia, unspecified: Secondary | ICD-10-CM

## 2014-04-28 DIAGNOSIS — R7303 Prediabetes: Secondary | ICD-10-CM

## 2014-04-28 DIAGNOSIS — R7309 Other abnormal glucose: Secondary | ICD-10-CM

## 2014-04-28 DIAGNOSIS — R1900 Intra-abdominal and pelvic swelling, mass and lump, unspecified site: Secondary | ICD-10-CM

## 2014-04-28 LAB — CBC WITH DIFFERENTIAL/PLATELET
BASOS PCT: 0.6 % (ref 0.0–3.0)
Basophils Absolute: 0 10*3/uL (ref 0.0–0.1)
EOS PCT: 1.6 % (ref 0.0–5.0)
Eosinophils Absolute: 0.1 10*3/uL (ref 0.0–0.7)
HCT: 37.2 % (ref 36.0–46.0)
HEMOGLOBIN: 12.2 g/dL (ref 12.0–15.0)
LYMPHS ABS: 2.1 10*3/uL (ref 0.7–4.0)
Lymphocytes Relative: 27.5 % (ref 12.0–46.0)
MCHC: 32.7 g/dL (ref 30.0–36.0)
MCV: 90 fl (ref 78.0–100.0)
MONOS PCT: 6.2 % (ref 3.0–12.0)
Monocytes Absolute: 0.5 10*3/uL (ref 0.1–1.0)
NEUTROS ABS: 4.8 10*3/uL (ref 1.4–7.7)
NEUTROS PCT: 64.1 % (ref 43.0–77.0)
PLATELETS: 266 10*3/uL (ref 150.0–400.0)
RBC: 4.13 Mil/uL (ref 3.87–5.11)
RDW: 13.5 % (ref 11.5–15.5)
WBC: 7.6 10*3/uL (ref 4.0–10.5)

## 2014-04-28 LAB — LIPID PANEL
CHOL/HDL RATIO: 4
Cholesterol: 172 mg/dL (ref 0–200)
HDL: 42.5 mg/dL (ref >39.00)
LDL CALC: 118 mg/dL — AB (ref 0–99)
NONHDL: 129.5
TRIGLYCERIDES: 60 mg/dL (ref 0.0–149.0)
VLDL: 12 mg/dL (ref 0.0–40.0)

## 2014-04-28 LAB — COMPREHENSIVE METABOLIC PANEL
ALBUMIN: 4 g/dL (ref 3.5–5.2)
ALK PHOS: 117 U/L (ref 39–117)
ALT: 12 U/L (ref 0–35)
AST: 16 U/L (ref 0–37)
BILIRUBIN TOTAL: 0.5 mg/dL (ref 0.2–1.2)
BUN: 12 mg/dL (ref 6–23)
CALCIUM: 9.3 mg/dL (ref 8.4–10.5)
CHLORIDE: 106 meq/L (ref 96–112)
CO2: 32 mEq/L (ref 19–32)
CREATININE: 0.74 mg/dL (ref 0.40–1.20)
GFR: 102.12 mL/min (ref >60.00)
GLUCOSE: 128 mg/dL — AB (ref 70–99)
POTASSIUM: 3.9 meq/L (ref 3.5–5.1)
Sodium: 140 mEq/L (ref 135–145)
Total Protein: 7.2 g/dL (ref 6.0–8.3)

## 2014-04-28 LAB — HEMOGLOBIN A1C: Hgb A1c MFr Bld: 6.5 % (ref 4.6–6.5)

## 2014-04-28 MED ORDER — LORCASERIN HCL 10 MG PO TABS: 10.0000 mg | ORAL_TABLET | Freq: Two times a day (BID) | ORAL | Status: DC

## 2014-04-28 NOTE — Progress Notes (Signed)
Pre visit review using our clinic review tool, if applicable. No additional management support is needed unless otherwise documented below in the visit note. 

## 2014-04-28 NOTE — Assessment & Plan Note (Signed)
Knows to contact GYN when desires further eval/treatment. Prefers female [place] provider if needed.

## 2014-04-28 NOTE — Telephone Encounter (Signed)
Pt made her cpx appointment.  She stated she had lab work done today 3/18 and wanted to know if she needed to have her labs done again prior to her cpx in june

## 2014-04-28 NOTE — Assessment & Plan Note (Signed)
Recheck today. 

## 2014-04-28 NOTE — Assessment & Plan Note (Signed)
13 lb weight loss over 5 months, healthy diet changes implemented. Discussed increased exercise in routine.  Continue belviq, refilled x3 mo

## 2014-04-28 NOTE — Progress Notes (Signed)
BP 118/60 mmHg  Pulse 80  Temp(Src) 98.1 F (36.7 C) (Oral)  Wt 196 lb 4 oz (89.018 kg)   CC: 3 mo f/u visit  Subjective:    Patient ID: Brandy Guerrero, female    DOB: 26-Nov-1950, 64 y.o.   MRN: [id number]  HPI: REED EIFERT is a 64 y.o. female presenting on 04/28/2014 for Follow-up   Obesity - we started belviq ~11/2013. Continued weight loss noted - another 6 lbs in last 3 months. Continues healthy diet and lifestyle changes as per prior office note (preparing lunch for work, stopping fried foods, more water, less eating out, more fruits throughout the day as healthy snacks). Largest meal is prior to work (2nd shift).   We had decided to work on increased regular exercise into routine. Still has not started exercise routine. She does stay active working 3 jobs.   H/o parox afib on metoprolol prn, HLD. H/o prediabetes Known pelvic mass, continues to refuse resection. Aware if she decides to undergo procedure, may make appt with GYN.  Has not seen Dr Phineas Real recently. Advised she is overdue for well woman exam. Fasting today - will check labs  Relevant past medical, surgical, family and social history reviewed and updated as indicated. Interim medical history since our last visit reviewed. Allergies and medications reviewed and updated. Current Outpatient Prescriptions on File Prior to Visit  Medication Sig  . Betamethasone Valerate 0.12 % foam as directed.  . cyclobenzaprine (FLEXERIL) 10 MG tablet Take 1 tablet (10 mg total) by mouth 2 (two) times daily as needed for muscle spasms (sedation precautions).  . diclofenac (VOLTAREN) 75 MG EC tablet Take 75 mg by mouth 2 (two) times daily.  Marland Kitchen doxycycline (VIBRAMYCIN) 100 MG capsule Take 100 mg by mouth daily. For face  . FLUOCINOLONE ACETONIDE SCALP 0.01 % external oil as directed.  Marland Kitchen ketoconazole (NIZORAL) 2 % shampoo as directed.  . metoprolol tartrate (LOPRESSOR) 25 MG tablet Take 1 tablet (25 mg total) by mouth 2 (two)  times daily as needed. Heart fluttering  . nabumetone (RELAFEN) 750 MG tablet Take 1 tablet (750 mg total) by mouth 2 (two) times daily as needed (with food).  . NON FORMULARY Hair and nail vitamin  . simvastatin (ZOCOR) 40 MG tablet TAKE ONE TABLET BY MOUTH ONCE DAILY AT  SUPPER   No current facility-administered medications on file prior to visit.    Review of Systems Per HPI unless specifically indicated above     Objective:    BP 118/60 mmHg  Pulse 80  Temp(Src) 98.1 F (36.7 C) (Oral)  Wt 196 lb 4 oz (89.018 kg)  Wt Readings from Last 3 Encounters:  04/28/14 196 lb 4 oz (89.018 kg)  01/20/14 202 lb (91.627 kg)  11/28/13 206 lb 4 oz (93.554 kg)   Body mass index is 34.21 kg/(m^2).  Physical Exam  Constitutional: She appears well-developed and well-nourished. No distress.  HENT:  Mouth/Throat: Oropharynx is clear and moist. No oropharyngeal exudate.  Cardiovascular: Normal rate, regular rhythm, normal heart sounds and intact distal pulses.   No murmur heard. Pulmonary/Chest: Effort normal and breath sounds normal. No respiratory distress. She has no wheezes. She has no rales.  Musculoskeletal: She exhibits no edema.  Skin: Skin is warm and dry. No rash noted.  Psychiatric: She has a normal mood and affect.  Nursing note and vitals reviewed.      Assessment & Plan:   Problem List Items Addressed This Visit  Prediabetes    Anticipate better control based on weight loss noted. Recheck A1c today.      Relevant Orders   Hemoglobin A1c   Pelvic mass    Knows to contact GYN when desires further eval/treatment. Prefers female [place] provider if needed.      Relevant Orders   CBC with Differential/Platelet   Obesity    13 lb weight loss over 5 months, healthy diet changes implemented. Discussed increased exercise in routine.  Continue belviq, refilled x3 mo      Relevant Medications   Lorcaserin HCl (BELVIQ) 10 MG TABS   HLD (hyperlipidemia) - Primary     Recheck today.      Relevant Orders   Lipid panel   Comprehensive metabolic panel       Follow up plan: Return in about 4 months (around 08/28/2014), or as needed, for annual exam, prior fasting for blood work.

## 2014-04-28 NOTE — Telephone Encounter (Signed)
No don't need rpt labs then.

## 2014-04-28 NOTE — Patient Instructions (Addendum)
Schedule well woman with Dr Phineas Real [phone] Blood work today.  Refill belvique for next 3 months. Schedule annual exam at next visit in 3-4 months.

## 2014-04-28 NOTE — Telephone Encounter (Signed)
Message left advising patient.  

## 2014-04-28 NOTE — Assessment & Plan Note (Signed)
Anticipate better control based on weight loss noted. Recheck A1c today.

## 2014-05-01 ENCOUNTER — Encounter: Payer: Self-pay | Admitting: *Deleted

## 2014-07-21 ENCOUNTER — Encounter: Payer: Commercial Managed Care - PPO | Admitting: Family Medicine

## 2014-09-27 ENCOUNTER — Other Ambulatory Visit: Payer: Self-pay | Admitting: Family Medicine

## 2014-09-27 DIAGNOSIS — E785 Hyperlipidemia, unspecified: Secondary | ICD-10-CM

## 2014-09-27 DIAGNOSIS — R1900 Intra-abdominal and pelvic swelling, mass and lump, unspecified site: Secondary | ICD-10-CM

## 2014-09-27 DIAGNOSIS — R7303 Prediabetes: Secondary | ICD-10-CM

## 2014-09-28 ENCOUNTER — Other Ambulatory Visit (INDEPENDENT_AMBULATORY_CARE_PROVIDER_SITE_OTHER): Payer: Commercial Managed Care - PPO

## 2014-09-28 DIAGNOSIS — R7303 Prediabetes: Secondary | ICD-10-CM

## 2014-09-28 DIAGNOSIS — R7309 Other abnormal glucose: Secondary | ICD-10-CM

## 2014-09-28 DIAGNOSIS — R1900 Intra-abdominal and pelvic swelling, mass and lump, unspecified site: Secondary | ICD-10-CM

## 2014-09-28 DIAGNOSIS — E785 Hyperlipidemia, unspecified: Secondary | ICD-10-CM

## 2014-09-28 LAB — CBC WITH DIFFERENTIAL/PLATELET
Basophils Absolute: 0 10*3/uL (ref 0.0–0.1)
Basophils Relative: 0.5 % (ref 0.0–3.0)
EOS PCT: 2.3 % (ref 0.0–5.0)
Eosinophils Absolute: 0.2 10*3/uL (ref 0.0–0.7)
HEMATOCRIT: 38.5 % (ref 36.0–46.0)
HEMOGLOBIN: 12.6 g/dL (ref 12.0–15.0)
LYMPHS PCT: 28.3 % (ref 12.0–46.0)
Lymphs Abs: 2.2 10*3/uL (ref 0.7–4.0)
MCHC: 32.7 g/dL (ref 30.0–36.0)
MCV: 90.5 fl (ref 78.0–100.0)
MONO ABS: 0.5 10*3/uL (ref 0.1–1.0)
Monocytes Relative: 6 % (ref 3.0–12.0)
Neutro Abs: 4.8 10*3/uL (ref 1.4–7.7)
Neutrophils Relative %: 62.9 % (ref 43.0–77.0)
Platelets: 261 10*3/uL (ref 150.0–400.0)
RBC: 4.25 Mil/uL (ref 3.87–5.11)
RDW: 13.8 % (ref 11.5–15.5)
WBC: 7.6 10*3/uL (ref 4.0–10.5)

## 2014-09-28 LAB — BASIC METABOLIC PANEL
BUN: 14 mg/dL (ref 6–23)
CALCIUM: 9.3 mg/dL (ref 8.4–10.5)
CO2: 30 meq/L (ref 19–32)
Chloride: 107 mEq/L (ref 96–112)
Creatinine, Ser: 0.73 mg/dL (ref 0.40–1.20)
GFR: 103.6 mL/min (ref >60.00)
GLUCOSE: 131 mg/dL — AB (ref 70–99)
POTASSIUM: 4.1 meq/L (ref 3.5–5.1)
SODIUM: 142 meq/L (ref 135–145)

## 2014-09-28 LAB — LDL CHOLESTEROL, DIRECT: LDL DIRECT: 111 mg/dL

## 2014-09-28 LAB — HEMOGLOBIN A1C: Hgb A1c MFr Bld: 6.2 % (ref 4.6–6.5)

## 2014-10-02 ENCOUNTER — Ambulatory Visit (INDEPENDENT_AMBULATORY_CARE_PROVIDER_SITE_OTHER): Payer: Commercial Managed Care - PPO | Admitting: Family Medicine

## 2014-10-02 ENCOUNTER — Encounter: Payer: Self-pay | Admitting: Family Medicine

## 2014-10-02 VITALS — BP 110/70 | HR 66 | Temp 98.0°F | Ht 63.0 in | Wt 198.5 lb

## 2014-10-02 DIAGNOSIS — Z0000 Encounter for general adult medical examination without abnormal findings: Secondary | ICD-10-CM

## 2014-10-02 DIAGNOSIS — E785 Hyperlipidemia, unspecified: Secondary | ICD-10-CM

## 2014-10-02 DIAGNOSIS — R1900 Intra-abdominal and pelvic swelling, mass and lump, unspecified site: Secondary | ICD-10-CM

## 2014-10-02 DIAGNOSIS — R7303 Prediabetes: Secondary | ICD-10-CM

## 2014-10-02 DIAGNOSIS — IMO0001 Reserved for inherently not codable concepts without codable children: Secondary | ICD-10-CM

## 2014-10-02 DIAGNOSIS — Z0001 Encounter for general adult medical examination with abnormal findings: Secondary | ICD-10-CM

## 2014-10-02 DIAGNOSIS — K429 Umbilical hernia without obstruction or gangrene: Secondary | ICD-10-CM

## 2014-10-02 DIAGNOSIS — I480 Paroxysmal atrial fibrillation: Secondary | ICD-10-CM

## 2014-10-02 NOTE — Assessment & Plan Note (Signed)
Encouraged return to Dr Phineas Real to review pro/cons of surgery. She states she will schedule appt. Main concern is financial burden of surgery.

## 2014-10-02 NOTE — Assessment & Plan Note (Signed)
Preventative protocols reviewed and updated unless pt declined. Discussed healthy diet and lifestyle.  

## 2014-10-02 NOTE — Assessment & Plan Note (Signed)
Not regular with belviq. Discussed heatlhy diet and lifestyle changes to affect sustainable weight loss.

## 2014-10-02 NOTE — Progress Notes (Signed)
BP 110/70 mmHg  Pulse 66  Temp(Src) 98 F (36.7 C) (Oral)  Ht 5\' 3"  (1.6 m)  Wt 198 lb 8 oz (90.039 kg)  BMI 35.17 kg/m2   CC: CPE  Subjective:    Patient ID: Brandy Guerrero, female    DOB: Oct 24, 1950, 64 y.o.   MRN: [id number]  HPI: Brandy Guerrero is a 64 y.o. female presenting on 10/02/2014 for Annual Exam   Obesity - on belviq for this. Takes intermittently and once daily. Lost 13 lbs with this. Last filled 60 with #3 refills on 04/2014.   Pelvic mass - has not returned to see Dr Willis Modena. Continues to decline further eval of pelvic mass. Denies new sxs.   H/o paroxy afib. Still intermittently with afib flares. Takes metoprolol prn. Has been on coumadin in past. Self stopped. Prior saw Dr. Claiborne Billings as cardiologist. Hiram Comber score = 1, 1.3% risk of stroke off coumadin.  Preventative: Colon cancer screening - iFOB positive (2014), s/p colonoscopy [name]) with diverticulosis, but no reason for bleed found. Rpt stool cards last year negative. Otherwise rpt colonoscopy 10 yrs. Well woman with [name] - pap and breast exam 05/2011 - due for repeat. Has not returned to see him. Ho hysterectomy for fibroids, one ovary remains. Agrees to return to see him to discuss this.  Mammogram 12/2013. Birads1.  Flu shot - at work Tdap - 2013 zostavax - will check with insurance Seat belt use discussed  No changing moles on skin.   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  Activity: starting regular walking - 3 mi about 2d/wk  Diet: good water, fruits/vegetables daily   Relevant past medical, surgical, family and social history reviewed and updated as indicated. Interim medical history since our last visit reviewed. Allergies and medications reviewed and updated. Current Outpatient Prescriptions on File Prior to Visit  Medication Sig  . Betamethasone  Valerate 0.12 % foam as directed.  . doxycycline (VIBRAMYCIN) 100 MG capsule Take 100 mg by mouth daily. For face  . ketoconazole (NIZORAL) 2 % shampoo as directed.  . Lorcaserin HCl (BELVIQ) 10 MG TABS Take 10 mg by mouth 2 (two) times daily.  . metoprolol tartrate (LOPRESSOR) 25 MG tablet Take 1 tablet (25 mg total) by mouth 2 (two) times daily as needed. Heart fluttering  . NON FORMULARY Hair and nail vitamin  . simvastatin (ZOCOR) 40 MG tablet TAKE ONE TABLET BY MOUTH ONCE DAILY AT  SUPPER  . FLUOCINOLONE ACETONIDE SCALP 0.01 % external oil as directed.   No current facility-administered medications on file prior to visit.    Review of Systems  Constitutional: Negative for fever, chills, activity change, appetite change, fatigue and unexpected weight change.  HENT: Negative for hearing loss.   Eyes: Negative for visual disturbance.  Respiratory: Negative for cough, chest tightness, shortness of breath and wheezing.   Cardiovascular: Negative for chest pain, palpitations and leg swelling.  Gastrointestinal: Negative for nausea, vomiting, abdominal pain, diarrhea, constipation, blood in stool and abdominal distention.  Genitourinary: Negative for hematuria and difficulty urinating.  Musculoskeletal: Negative for myalgias, arthralgias and neck pain.  Skin: Negative for rash.  Neurological: Positive for headaches. Negative for dizziness, seizures and syncope.  Hematological: Negative for adenopathy. Does not bruise/bleed easily.  Psychiatric/Behavioral: Negative for dysphoric mood. The patient is nervous/anxious.        Home stressors  Per HPI unless specifically indicated above     Objective:    BP 110/70 mmHg  Pulse 66  Temp(Src) 98 F (36.7 C) (Oral)  Ht 5\' 3"  (1.6 m)  Wt 198 lb 8 oz (90.039 kg)  BMI 35.17 kg/m2  Wt Readings from Last 3 Encounters:  10/02/14 198 lb 8 oz (90.039 kg)  04/28/14 196 lb 4 oz (89.018 kg)  01/20/14 202 lb (91.627 kg)    Physical Exam    Constitutional: She is oriented to person, place, and time. She appears well-developed and well-nourished. No distress.  HENT:  Head: Normocephalic and atraumatic.  Right Ear: Hearing, tympanic membrane, external ear and ear canal normal.  Left Ear: Hearing, tympanic membrane, external ear and ear canal normal.  Nose: Nose normal.  Mouth/Throat: Uvula is midline, oropharynx is clear and moist and mucous membranes are normal. No oropharyngeal exudate, posterior oropharyngeal edema or posterior oropharyngeal erythema.  Eyes: Conjunctivae and EOM are normal. Pupils are equal, round, and reactive to light. No scleral icterus.  Neck: Normal range of motion. Neck supple. Carotid bruit is not present. No thyromegaly present.  Cardiovascular: Normal rate, regular rhythm, normal heart sounds and intact distal pulses.   No murmur heard. Pulses:      Radial pulses are 2+ on the right side, and 2+ on the left side.  Pulmonary/Chest: Effort normal and breath sounds normal. No respiratory distress. She has no wheezes. She has no rales.  Abdominal: Soft. Normal appearance and bowel sounds are normal. She exhibits no distension and no mass. There is no hepatosplenomegaly. There is no tenderness. There is no rigidity, no rebound, no guarding, no CVA tenderness and negative Murphy's sign. A hernia (reducible umbilical hernia, non tender) is present.  Some LLQ fullness without tenderness  Musculoskeletal: Normal range of motion. She exhibits no edema.  Lymphadenopathy:    She has no cervical adenopathy.  Neurological: She is alert and oriented to person, place, and time.  CN grossly intact, station and gait intact  Skin: Skin is warm and dry. No rash noted.  Psychiatric: She has a normal mood and affect. Her behavior is normal. Judgment and thought content normal.  Nursing note and vitals reviewed.  Results for orders placed or performed in visit on 09/28/14  Hemoglobin A1c  Result Value Ref Range   Hgb  A1c MFr Bld 6.2 4.6 - 6.5 %  LDL Cholesterol, Direct  Result Value Ref Range   Direct LDL 111.0 mg/dL  CBC with Differential/Platelet  Result Value Ref Range   WBC 7.6 4.0 - 10.5 K/uL   RBC 4.25 3.87 - 5.11 Mil/uL   Hemoglobin 12.6 12.0 - 15.0 g/dL   HCT 38.5 36.0 - 46.0 %   MCV 90.5 78.0 - 100.0 fl   MCHC 32.7 30.0 - 36.0 g/dL   RDW 13.8 11.5 - 15.5 %   Platelets 261.0 150.0 - 400.0 K/uL   Neutrophils Relative % 62.9 43.0 - 77.0 %   Lymphocytes Relative 28.3 12.0 - 46.0 %   Monocytes Relative 6.0 3.0 - 12.0 %   Eosinophils Relative 2.3 0.0 - 5.0 %   Basophils Relative 0.5 0.0 - 3.0 %   Neutro Abs 4.8 1.4 - 7.7 K/uL   Lymphs Abs 2.2 0.7 - 4.0 K/uL   Monocytes Absolute 0.5 0.1 - 1.0 K/uL   Eosinophils Absolute 0.2 0.0 - 0.7 K/uL   Basophils Absolute 0.0 0.0 - 0.1 K/uL  Basic metabolic panel  Result Value Ref Range   Sodium  142 135 - 145 mEq/L   Potassium 4.1 3.5 - 5.1 mEq/L   Chloride 107 96 - 112 mEq/L   CO2 30 19 - 32 mEq/L   Glucose, Bld 131 (H) 70 - 99 mg/dL   BUN 14 6 - 23 mg/dL   Creatinine, Ser 0.73 0.40 - 1.20 mg/dL   Calcium 9.3 8.4 - 10.5 mg/dL   GFR 103.60 >60.00 mL/min      Assessment & Plan:   Problem List Items Addressed This Visit    HLD (hyperlipidemia)    Chronic, stable on simvastatin.      Relevant Medications   aspirin 81 MG tablet   Pelvic mass    Encouraged return to Dr Phineas Real to review pro/cons of surgery. She states she will schedule appt. Main concern is financial burden of surgery.      Paroxysmal atrial fibrillation    Endorses continued intermittent flares. ?if truly afib. Continue aspirin. Rare metoprolol use. Sounds regular today.      Relevant Medications   aspirin 81 MG tablet   Umbilical hernia    Reducible.      Obesity, Class II, BMI 35-39.9, with comorbidity    Not regular with belviq. Discussed heatlhy diet and lifestyle changes to affect sustainable weight loss.      Healthcare maintenance - Primary     Preventative protocols reviewed and updated unless pt declined. Discussed healthy diet and lifestyle.       Prediabetes    Reviewed #s with patient. Encouraged avoiding added sugars to maintain glycemic control.          Follow up plan: Return in about 6 months (around 04/04/2015), or as needed, for follow up visit.

## 2014-10-02 NOTE — Progress Notes (Signed)
Pre visit review using our clinic review tool, if applicable. No additional management support is needed unless otherwise documented below in the visit note. 

## 2014-10-02 NOTE — Assessment & Plan Note (Signed)
Reducible

## 2014-10-02 NOTE — Assessment & Plan Note (Signed)
Chronic, stable on simvastatin.

## 2014-10-02 NOTE — Assessment & Plan Note (Addendum)
Reviewed #s with patient. Encouraged avoiding added sugars to maintain glycemic control.

## 2014-10-02 NOTE — Patient Instructions (Addendum)
Schedule follow up with Dr Phineas Real as you're due. Return in 6 months for follow up visit.   Health Maintenance Adopting a healthy lifestyle and getting preventive care can go a long way to promote health and wellness. Talk with your health care provider about what schedule of regular examinations is right for you. This is a good chance for you to check in with your provider about disease prevention and staying healthy. In between checkups, there are plenty of things you can do on your own. Experts have done a lot of research about which lifestyle changes and preventive measures are most likely to keep you healthy. Ask your health care provider for more information. WEIGHT AND DIET  Eat a healthy diet  Be sure to include plenty of vegetables, fruits, low-fat dairy products, and lean protein.  Do not eat a lot of foods high in solid fats, added sugars, or salt.  Get regular exercise. This is one of the most important things you can do for your health.  Most adults should exercise for at least 150 minutes each week. The exercise should increase your heart rate and make you sweat (moderate-intensity exercise).  Most adults should also do strengthening exercises at least twice a week. This is in addition to the moderate-intensity exercise.  Maintain a healthy weight  Body mass index (BMI) is a measurement that can be used to identify possible weight problems. It estimates body fat based on height and weight. Your health care provider can help determine your BMI and help you achieve or maintain a healthy weight.  For females 52 years of age and older:   A BMI below 18.5 is considered underweight.  A BMI of 18.5 to 24.9 is normal.  A BMI of 25 to 29.9 is considered overweight.  A BMI of 30 and above is considered obese.  Watch levels of cholesterol and blood lipids  You should start having your blood tested for lipids and cholesterol at 63 years of age, then have this test every 5  years.  You may need to have your cholesterol levels checked more often if:  Your lipid or cholesterol levels are high.  You are older than 63 years of age.  You are at high risk for heart disease.  CANCER SCREENING   Lung Cancer  Lung cancer screening is recommended for adults 44-45 years old who are at high risk for lung cancer because of a history of smoking.  A yearly low-dose CT scan of the lungs is recommended for people who:  Currently smoke.  Have quit within the past 15 years.  Have at least a 30-pack-year history of smoking. A pack year is smoking an average of one pack of cigarettes a day for 1 year.  Yearly screening should continue until it has been 15 years since you quit.  Yearly screening should stop if you develop a health problem that would prevent you from having lung cancer treatment.  Breast Cancer  Practice breast self-awareness. This means understanding how your breasts normally appear and feel.  It also means doing regular breast self-exams. Let your health care provider know about any changes, no matter how small.  If you are in your 20s or 30s, you should have a clinical breast exam (CBE) by a health care provider every 1-3 years as part of a regular health exam.  If you are 23 or older, have a CBE every year. Also consider having a breast X-ray (mammogram) every year.  If you have  a family history of breast cancer, talk to your health care provider about genetic screening.  If you are at high risk for breast cancer, talk to your health care provider about having an MRI and a mammogram every year.  Breast cancer gene (BRCA) assessment is recommended for women who have family members with BRCA-related cancers. BRCA-related cancers include:  Breast.  Ovarian.  Tubal.  Peritoneal cancers.  Results of the assessment will determine the need for genetic counseling and BRCA1 and BRCA2 testing. Cervical Cancer Routine pelvic examinations to  screen for cervical cancer are no longer recommended for nonpregnant women who are considered low risk for cancer of the pelvic organs (ovaries, uterus, and vagina) and who do not have symptoms. A pelvic examination may be necessary if you have symptoms including those associated with pelvic infections. Ask your health care provider if a screening pelvic exam is right for you.   The Pap test is the screening test for cervical cancer for women who are considered at risk.  If you had a hysterectomy for a problem that was not cancer or a condition that could lead to cancer, then you no longer need Pap tests.  If you are older than 65 years, and you have had normal Pap tests for the past 10 years, you no longer need to have Pap tests.  If you have had past treatment for cervical cancer or a condition that could lead to cancer, you need Pap tests and screening for cancer for at least 20 years after your treatment.  If you no longer get a Pap test, assess your risk factors if they change (such as having a new sexual partner). This can affect whether you should start being screened again.  Some women have medical problems that increase their chance of getting cervical cancer. If this is the case for you, your health care provider may recommend more frequent screening and Pap tests.  The human papillomavirus (HPV) test is another test that may be used for cervical cancer screening. The HPV test looks for the virus that can cause cell changes in the cervix. The cells collected during the Pap test can be tested for HPV.  The HPV test can be used to screen women 58 years of age and older. Getting tested for HPV can extend the interval between normal Pap tests from three to five years.  An HPV test also should be used to screen women of any age who have unclear Pap test results.  After 62 years of age, women should have HPV testing as often as Pap tests.  Colorectal Cancer  This type of cancer can be  detected and often prevented.  Routine colorectal cancer screening usually begins at 63 years of age and continues through 63 years of age.  Your health care provider may recommend screening at an earlier age if you have risk factors for colon cancer.  Your health care provider may also recommend using home test kits to check for hidden blood in the stool.  A small camera at the end of a tube can be used to examine your colon directly (sigmoidoscopy or colonoscopy). This is done to check for the earliest forms of colorectal cancer.  Routine screening usually begins at age 16.  Direct examination of the colon should be repeated every 5-10 years through 63 years of age. However, you may need to be screened more often if early forms of precancerous polyps or small growths are found. Skin Cancer  Check your  skin from head to toe regularly.  Tell your health care provider about any new moles or changes in moles, especially if there is a change in a mole's shape or color.  Also tell your health care provider if you have a mole that is larger than the size of a pencil eraser.  Always use sunscreen. Apply sunscreen liberally and repeatedly throughout the day.  Protect yourself by wearing long sleeves, pants, a wide-brimmed hat, and sunglasses whenever you are outside. HEART DISEASE, DIABETES, AND HIGH BLOOD PRESSURE   Have your blood pressure checked at least every 1-2 years. High blood pressure causes heart disease and increases the risk of stroke.  If you are between 9 years and 46 years old, ask your health care provider if you should take aspirin to prevent strokes.  Have regular diabetes screenings. This involves taking a blood sample to check your fasting blood sugar level.  If you are at a normal weight and have a low risk for diabetes, have this test once every three years after 63 years of age.  If you are overweight and have a high risk for diabetes, consider being tested at a  younger age or more often. PREVENTING INFECTION  Hepatitis B  If you have a higher risk for hepatitis B, you should be screened for this virus. You are considered at high risk for hepatitis B if:  You were born in a country where hepatitis B is common. Ask your health care provider which countries are considered high risk.  Your parents were born in a high-risk country, and you have not been immunized against hepatitis B (hepatitis B vaccine).  You have HIV or AIDS.  You use needles to inject street drugs.  You live with someone who has hepatitis B.  You have had sex with someone who has hepatitis B.  You get hemodialysis treatment.  You take certain medicines for conditions, including cancer, organ transplantation, and autoimmune conditions. Hepatitis C  Blood testing is recommended for:  Everyone born from [birth year] through 1965.  Anyone with known risk factors for hepatitis C. Sexually transmitted infections (STIs)  You should be screened for sexually transmitted infections (STIs) including gonorrhea and chlamydia if:  You are sexually active and are younger than 63 years of age.  You are older than 63 years of age and your health care provider tells you that you are at risk for this type of infection.  Your sexual activity has changed since you were last screened and you are at an increased risk for chlamydia or gonorrhea. Ask your health care provider if you are at risk.  If you do not have HIV, but are at risk, it may be recommended that you take a prescription medicine daily to prevent HIV infection. This is called pre-exposure prophylaxis (PrEP). You are considered at risk if:  You are sexually active and do not regularly use condoms or know the HIV status of your partner(s).  You take drugs by injection.  You are sexually active with a partner who has HIV. Talk with your health care provider about whether you are at high risk of being infected with HIV. If you choose  to begin PrEP, you should first be tested for HIV. You should then be tested every 3 months for as long as you are taking PrEP.  PREGNANCY   If you are premenopausal and you may become pregnant, ask your health care provider about preconception counseling.  If you may become pregnant, take 400  to 800 micrograms (mcg) of folic acid every day.  If you want to prevent pregnancy, talk to your health care provider about birth control (contraception). OSTEOPOROSIS AND MENOPAUSE   Osteoporosis is a disease in which the bones lose minerals and strength with aging. This can result in serious bone fractures. Your risk for osteoporosis can be identified using a bone density scan.  If you are 47 years of age or older, or if you are at risk for osteoporosis and fractures, ask your health care provider if you should be screened.  Ask your health care provider whether you should take a calcium or vitamin D supplement to lower your risk for osteoporosis.  Menopause may have certain physical symptoms and risks.  Hormone replacement therapy may reduce some of these symptoms and risks. Talk to your health care provider about whether hormone replacement therapy is right for you.  HOME CARE INSTRUCTIONS   Schedule regular health, dental, and eye exams.  Stay current with your immunizations.   Do not use any tobacco products including cigarettes, chewing tobacco, or electronic cigarettes.  If you are pregnant, do not drink alcohol.  If you are breastfeeding, limit how much and how often you drink alcohol.  Limit alcohol intake to no more than 1 drink per day for nonpregnant women. One drink equals 12 ounces of beer, 5 ounces of wine, or 1 ounces of hard liquor.  Do not use street drugs.  Do not share needles.  Ask your health care provider for help if you need support or information about quitting drugs.  Tell your health care provider if you often feel depressed.  Tell your health care  provider if you have ever been abused or do not feel safe at home. Document Released: 08/12/2010 Document Revised: 06/13/2013 Document Reviewed: 12/29/2012 [facility] Patient Information 2015 [place], [place]. This information is not intended to replace advice given to you by your health care provider. Make sure you discuss any questions you have with your health care provider.

## 2014-10-02 NOTE — Assessment & Plan Note (Addendum)
Endorses continued intermittent flares. ?if truly afib. Continue aspirin. Rare metoprolol use. Sounds regular today.

## 2014-12-07 ENCOUNTER — Other Ambulatory Visit: Payer: Self-pay | Admitting: Family Medicine

## 2014-12-07 NOTE — Telephone Encounter (Signed)
plz phone in. 

## 2014-12-07 NOTE — Telephone Encounter (Signed)
Ok to refill 

## 2014-12-07 NOTE — Telephone Encounter (Signed)
Rx called in as directed.   

## 2015-02-24 ENCOUNTER — Other Ambulatory Visit: Payer: Self-pay | Admitting: Family Medicine

## 2015-03-08 ENCOUNTER — Other Ambulatory Visit: Payer: Self-pay

## 2015-04-02 ENCOUNTER — Ambulatory Visit: Payer: Commercial Managed Care - PPO | Admitting: Family Medicine

## 2015-05-15 ENCOUNTER — Other Ambulatory Visit: Payer: Self-pay | Admitting: *Deleted

## 2015-05-15 MED ORDER — METOPROLOL TARTRATE 25 MG PO TABS: 25.0000 mg | ORAL_TABLET | Freq: Two times a day (BID) | ORAL | Status: DC | PRN

## 2015-05-17 ENCOUNTER — Ambulatory Visit: Payer: Commercial Managed Care - PPO | Admitting: Family Medicine

## 2015-05-29 ENCOUNTER — Encounter: Payer: Self-pay | Admitting: Family Medicine

## 2015-05-29 ENCOUNTER — Ambulatory Visit (INDEPENDENT_AMBULATORY_CARE_PROVIDER_SITE_OTHER): Payer: Commercial Managed Care - PPO | Admitting: Family Medicine

## 2015-05-29 VITALS — BP 110/70 | HR 78 | Temp 98.1°F | Ht 64.0 in | Wt 200.0 lb

## 2015-05-29 DIAGNOSIS — E785 Hyperlipidemia, unspecified: Secondary | ICD-10-CM

## 2015-05-29 DIAGNOSIS — E669 Obesity, unspecified: Secondary | ICD-10-CM

## 2015-05-29 DIAGNOSIS — R05 Cough: Secondary | ICD-10-CM

## 2015-05-29 DIAGNOSIS — R059 Cough, unspecified: Secondary | ICD-10-CM | POA: Insufficient documentation

## 2015-05-29 DIAGNOSIS — R7303 Prediabetes: Secondary | ICD-10-CM

## 2015-05-29 DIAGNOSIS — R053 Chronic cough: Secondary | ICD-10-CM | POA: Insufficient documentation

## 2015-05-29 DIAGNOSIS — R1900 Intra-abdominal and pelvic swelling, mass and lump, unspecified site: Secondary | ICD-10-CM | POA: Diagnosis not present

## 2015-05-29 MED ORDER — BENZONATATE 100 MG PO CAPS: 100.0000 mg | ORAL_CAPSULE | Freq: Three times a day (TID) | ORAL | Status: DC | PRN

## 2015-05-29 MED ORDER — LORCASERIN HCL 10 MG PO TABS: 1.0000 | ORAL_TABLET | Freq: Two times a day (BID) | ORAL | Status: DC

## 2015-05-29 NOTE — Assessment & Plan Note (Signed)
Chronic, stable. Continue simvastatin. FLP next labwork.

## 2015-05-29 NOTE — Assessment & Plan Note (Addendum)
Discussed belviq use - will refill and patient will price out. Coupon provided today. Pt has tolerated well in the past.  Discussed healthy diet and lifestyle changes to affect sustainable weight loss.

## 2015-05-29 NOTE — Patient Instructions (Addendum)
Refill belviq - sent to pharmacy. Coupon provided today.  Pass by Marion's office for referral to [facility] (female provider).  For post- infectious cough - try tessalon perls to take at work - swallow, don't chew Schedule mammogram as due. Return in 6 months for physical.

## 2015-05-29 NOTE — Assessment & Plan Note (Signed)
Anticipate post infectious cough - treat with tessalon perls. No signs of active infection currently.

## 2015-05-29 NOTE — Progress Notes (Signed)
Pre visit review using our clinic review tool, if applicable. No additional management support is needed unless otherwise documented below in the visit note. 

## 2015-05-29 NOTE — Assessment & Plan Note (Signed)
Continues to decline further eval, declined pelvic US today.  Requests referral to more local OBGYN to establish care and for well woman exam, requests female provider. Will place referral to local OBGYN.

## 2015-05-29 NOTE — Progress Notes (Signed)
BP 110/70 mmHg  Pulse 78  Temp(Src) 98.1 F (36.7 C) (Oral)  Ht 5\' 4"  (1.626 m)  Wt 200 lb (90.719 kg)  BMI 34.31 kg/m2  SpO2 98%   CC: f/u visit  Subjective:    Patient ID: Brandy Guerrero, female    DOB: Feb 09, 1951, 65 y.o.   MRN: [id number]  HPI: Brandy Guerrero is a 65 y.o. female presenting on 05/29/2015 for Follow-up   Obesity - weight gain noted. Prior on belviq - restarted this. Previously lost 13 lbs with this. Last filled 60 with 0 refills 11/2014.  She has been using [facility]. Requests refill, coupon provided today.  No headaches, trouble sleeping or chest pain. Lunch today consisted of laughing cow cheese and triscuits, tries to eat fruit for dinner at work (works 2nd shift).  Pelvic mass - has not returned to see Dr Phineas Real. Continues to decline further eval of pelvic mass. Denies new sxs.   HLD - compliant with simvastatin without myalgias.  Prediabetes - knows to avoid added sugars  Mild cough present over last 3 weeks. No fever, no productive cough. Self treated with mucinex and robitussin and nyquil. Initially URI sxs, now resolved. Denies GERD sxs. Mild allergy symptoms.   Relevant past medical, surgical, family and social history reviewed and updated as indicated. Interim medical history since our last visit reviewed. Allergies and medications reviewed and updated. Current Outpatient Prescriptions on File Prior to Visit  Medication Sig  . aspirin 81 MG tablet Take 81 mg by mouth daily.  . Betamethasone Valerate 0.12 % foam as directed.  . doxycycline (VIBRAMYCIN) 100 MG capsule Take 100 mg by mouth daily. For face  . FLUOCINOLONE ACETONIDE SCALP 0.01 % external oil as directed.  Marland Kitchen ketoconazole (NIZORAL) 2 % shampoo as directed.  . metoprolol tartrate (LOPRESSOR) 25 MG tablet Take 1 tablet (25 mg total) by mouth 2 (two) times daily as needed. Heart fluttering  . NON FORMULARY Hair and nail vitamin  . simvastatin (ZOCOR) 40 MG tablet TAKE ONE TABLET BY MOUTH  ONCE DAILY AT  SUPPER   No current facility-administered medications on file prior to visit.    Review of Systems Per HPI unless specifically indicated in ROS section     Objective:    BP 110/70 mmHg  Pulse 78  Temp(Src) 98.1 F (36.7 C) (Oral)  Ht 5\' 4"  (1.626 m)  Wt 200 lb (90.719 kg)  BMI 34.31 kg/m2  SpO2 98%  Wt Readings from Last 3 Encounters:  05/29/15 200 lb (90.719 kg)  10/02/14 198 lb 8 oz (90.039 kg)  04/28/14 196 lb 4 oz (89.018 kg)    Physical Exam  Constitutional: She appears well-developed and well-nourished. No distress.  HENT:  Mouth/Throat: Oropharynx is clear and moist. No oropharyngeal exudate.  Cardiovascular: Normal rate, regular rhythm, normal heart sounds and intact distal pulses.   No murmur heard. Pulmonary/Chest: Effort normal and breath sounds normal. No respiratory distress. She has no wheezes. She has no rales.  Musculoskeletal: She exhibits no edema.  Skin: Skin is warm and dry. No rash noted.  Psychiatric: She has a normal mood and affect.  Nursing note and vitals reviewed.      Assessment & Plan:   Problem List Items Addressed This Visit    HLD (hyperlipidemia)    Chronic, stable. Continue simvastatin. FLP next labwork.      Pelvic mass    Continues to decline further eval, declined pelvic US today.  Requests referral to more local  OBGYN to establish care and for well woman exam, requests female provider. Will place referral to local OBGYN.       Relevant Orders   Ambulatory referral to Gynecology   Obesity, Class I, BMI 30-34.9 - Primary    Discussed belviq use - will refill and patient will price out. Coupon provided today. Pt has tolerated well in the past.  Discussed healthy diet and lifestyle changes to affect sustainable weight loss.      Relevant Medications   Lorcaserin HCl (BELVIQ) 10 MG TABS   Prediabetes   Cough    Anticipate post infectious cough - treat with tessalon perls. No signs of active infection  currently.          Follow up plan: Return in about 6 months (around 11/28/2015), or as needed, for annual exam, prior fasting for blood work.  Brandy Bush, MD

## 2015-06-11 ENCOUNTER — Ambulatory Visit: Payer: Self-pay | Admitting: Gynecology

## 2015-06-20 ENCOUNTER — Other Ambulatory Visit: Payer: Self-pay

## 2015-06-20 ENCOUNTER — Encounter: Payer: Self-pay | Admitting: Gynecology

## 2015-06-20 ENCOUNTER — Ambulatory Visit (INDEPENDENT_AMBULATORY_CARE_PROVIDER_SITE_OTHER): Payer: Commercial Managed Care - PPO | Admitting: Gynecology

## 2015-06-20 VITALS — BP 124/78 | Ht 63.5 in | Wt 199.0 lb

## 2015-06-20 DIAGNOSIS — N952 Postmenopausal atrophic vaginitis: Secondary | ICD-10-CM | POA: Diagnosis not present

## 2015-06-20 DIAGNOSIS — R1900 Intra-abdominal and pelvic swelling, mass and lump, unspecified site: Secondary | ICD-10-CM | POA: Diagnosis not present

## 2015-06-20 DIAGNOSIS — Z1231 Encounter for screening mammogram for malignant neoplasm of breast: Secondary | ICD-10-CM

## 2015-06-20 DIAGNOSIS — Z01419 Encounter for gynecological examination (general) (routine) without abnormal findings: Secondary | ICD-10-CM

## 2015-06-20 NOTE — Patient Instructions (Signed)
Follow up in one year for annual exam Follow up sooner if you change your mind about surgery Follow up for your mammogram  You may obtain a copy of any labs that were done today by logging onto MyChart as outlined in the instructions provided with your AVS (after visit summary). The office will not call with normal lab results but certainly if there are any significant abnormalities then we will contact you.   Health Maintenance Adopting a healthy lifestyle and getting preventive care can go a long way to promote health and wellness. Talk with your health care provider about what schedule of regular examinations is right for you. This is a good chance for you to check in with your provider about disease prevention and staying healthy. In between checkups, there are plenty of things you can do on your own. Experts have done a lot of research about which lifestyle changes and preventive measures are most likely to keep you healthy. Ask your health care provider for more information. WEIGHT AND DIET  Eat a healthy diet  Be sure to include plenty of vegetables, fruits, low-fat dairy products, and lean protein.  Do not eat a lot of foods high in solid fats, added sugars, or salt.  Get regular exercise. This is one of the most important things you can do for your health.  Most adults should exercise for at least 150 minutes each week. The exercise should increase your heart rate and make you sweat (moderate-intensity exercise).  Most adults should also do strengthening exercises at least twice a week. This is in addition to the moderate-intensity exercise.  Maintain a healthy weight  Body mass index (BMI) is a measurement that can be used to identify possible weight problems. It estimates body fat based on height and weight. Your health care provider can help determine your BMI and help you achieve or maintain a healthy weight.  For females 8 years of age and older:   A BMI below 18.5 is  considered underweight.  A BMI of 18.5 to 24.9 is normal.  A BMI of 25 to 29.9 is considered overweight.  A BMI of 30 and above is considered obese.  Watch levels of cholesterol and blood lipids  You should start having your blood tested for lipids and cholesterol at 64 years of age, then have this test every 5 years.  You may need to have your cholesterol levels checked more often if:  Your lipid or cholesterol levels are high.  You are older than 64 years of age.  You are at high risk for heart disease.  CANCER SCREENING   Lung Cancer  Lung cancer screening is recommended for adults 36-57 years old who are at high risk for lung cancer because of a history of smoking.  A yearly low-dose CT scan of the lungs is recommended for people who:  Currently smoke.  Have quit within the past 15 years.  Have at least a 30-pack-year history of smoking. A pack year is smoking an average of one pack of cigarettes a day for 1 year.  Yearly screening should continue until it has been 15 years since you quit.  Yearly screening should stop if you develop a health problem that would prevent you from having lung cancer treatment.  Breast Cancer  Practice breast self-awareness. This means understanding how your breasts normally appear and feel.  It also means doing regular breast self-exams. Let your health care provider know about any changes, no matter how small.  If you are in your 20s or 30s, you should have a clinical breast exam (CBE) by a health care provider every 1-3 years as part of a regular health exam.  If you are 90 or older, have a CBE every year. Also consider having a breast X-ray (mammogram) every year.  If you have a family history of breast cancer, talk to your health care provider about genetic screening.  If you are at high risk for breast cancer, talk to your health care provider about having an MRI and a mammogram every year.  Breast cancer gene (BRCA)  assessment is recommended for women who have family members with BRCA-related cancers. BRCA-related cancers include:  Breast.  Ovarian.  Tubal.  Peritoneal cancers.  Results of the assessment will determine the need for genetic counseling and BRCA1 and BRCA2 testing. Cervical Cancer Routine pelvic examinations to screen for cervical cancer are no longer recommended for nonpregnant women who are considered low risk for cancer of the pelvic organs (ovaries, uterus, and vagina) and who do not have symptoms. A pelvic examination may be necessary if you have symptoms including those associated with pelvic infections. Ask your health care provider if a screening pelvic exam is right for you.   The Pap test is the screening test for cervical cancer for women who are considered at risk.  If you had a hysterectomy for a problem that was not cancer or a condition that could lead to cancer, then you no longer need Pap tests.  If you are older than 65 years, and you have had normal Pap tests for the past 10 years, you no longer need to have Pap tests.  If you have had past treatment for cervical cancer or a condition that could lead to cancer, you need Pap tests and screening for cancer for at least 20 years after your treatment.  If you no longer get a Pap test, assess your risk factors if they change (such as having a new sexual partner). This can affect whether you should start being screened again.  Some women have medical problems that increase their chance of getting cervical cancer. If this is the case for you, your health care provider may recommend more frequent screening and Pap tests.  The human papillomavirus (HPV) test is another test that may be used for cervical cancer screening. The HPV test looks for the virus that can cause cell changes in the cervix. The cells collected during the Pap test can be tested for HPV.  The HPV test can be used to screen women 42 years of age and older.  Getting tested for HPV can extend the interval between normal Pap tests from three to five years.  An HPV test also should be used to screen women of any age who have unclear Pap test results.  After 64 years of age, women should have HPV testing as often as Pap tests.  Colorectal Cancer  This type of cancer can be detected and often prevented.  Routine colorectal cancer screening usually begins at 64 years of age and continues through 64 years of age.  Your health care provider may recommend screening at an earlier age if you have risk factors for colon cancer.  Your health care provider may also recommend using home test kits to check for hidden blood in the stool.  A small camera at the end of a tube can be used to examine your colon directly (sigmoidoscopy or colonoscopy). This is done to check for  the earliest forms of colorectal cancer.  Routine screening usually begins at age 91.  Direct examination of the colon should be repeated every 5-10 years through 64 years of age. However, you may need to be screened more often if early forms of precancerous polyps or small growths are found. Skin Cancer  Check your skin from head to toe regularly.  Tell your health care provider about any new moles or changes in moles, especially if there is a change in a mole's shape or color.  Also tell your health care provider if you have a mole that is larger than the size of a pencil eraser.  Always use sunscreen. Apply sunscreen liberally and repeatedly throughout the day.  Protect yourself by wearing long sleeves, pants, a wide-brimmed hat, and sunglasses whenever you are outside. HEART DISEASE, DIABETES, AND HIGH BLOOD PRESSURE   Have your blood pressure checked at least every 1-2 years. High blood pressure causes heart disease and increases the risk of stroke.  If you are between 84 years and 23 years old, ask your health care provider if you should take aspirin to prevent  strokes.  Have regular diabetes screenings. This involves taking a blood sample to check your fasting blood sugar level.  If you are at a normal weight and have a low risk for diabetes, have this test once every three years after 64 years of age.  If you are overweight and have a high risk for diabetes, consider being tested at a younger age or more often. PREVENTING INFECTION  Hepatitis B  If you have a higher risk for hepatitis B, you should be screened for this virus. You are considered at high risk for hepatitis B if:  You were born in a country where hepatitis B is common. Ask your health care provider which countries are considered high risk.  Your parents were born in a high-risk country, and you have not been immunized against hepatitis B (hepatitis B vaccine).  You have HIV or AIDS.  You use needles to inject street drugs.  You live with someone who has hepatitis B.  You have had sex with someone who has hepatitis B.  You get hemodialysis treatment.  You take certain medicines for conditions, including cancer, organ transplantation, and autoimmune conditions. Hepatitis C  Blood testing is recommended for:  Everyone born from [birth year] through 1964.  Anyone with known risk factors for hepatitis C. Sexually transmitted infections (STIs)  You should be screened for sexually transmitted infections (STIs) including gonorrhea and chlamydia if:  You are sexually active and are younger than 64 years of age.  You are older than 64 years of age and your health care provider tells you that you are at risk for this type of infection.  Your sexual activity has changed since you were last screened and you are at an increased risk for chlamydia or gonorrhea. Ask your health care provider if you are at risk.  If you do not have HIV, but are at risk, it may be recommended that you take a prescription medicine daily to prevent HIV infection. This is called pre-exposure prophylaxis  (PrEP). You are considered at risk if:  You are sexually active and do not regularly use condoms or know the HIV status of your partner(s).  You take drugs by injection.  You are sexually active with a partner who has HIV. Talk with your health care provider about whether you are at high risk of being infected with HIV. If you  choose to begin PrEP, you should first be tested for HIV. You should then be tested every 3 months for as long as you are taking PrEP.  PREGNANCY   If you are premenopausal and you may become pregnant, ask your health care provider about preconception counseling.  If you may become pregnant, take 400 to 800 micrograms (mcg) of folic acid every day.  If you want to prevent pregnancy, talk to your health care provider about birth control (contraception). OSTEOPOROSIS AND MENOPAUSE   Osteoporosis is a disease in which the bones lose minerals and strength with aging. This can result in serious bone fractures. Your risk for osteoporosis can be identified using a bone density scan.  If you are 51 years of age or older, or if you are at risk for osteoporosis and fractures, ask your health care provider if you should be screened.  Ask your health care provider whether you should take a calcium or vitamin D supplement to lower your risk for osteoporosis.  Menopause may have certain physical symptoms and risks.  Hormone replacement therapy may reduce some of these symptoms and risks. Talk to your health care provider about whether hormone replacement therapy is right for you.  HOME CARE INSTRUCTIONS   Schedule regular health, dental, and eye exams.  Stay current with your immunizations.   Do not use any tobacco products including cigarettes, chewing tobacco, or electronic cigarettes.  If you are pregnant, do not drink alcohol.  If you are breastfeeding, limit how much and how often you drink alcohol.  Limit alcohol intake to no more than 1 drink per day for  nonpregnant women. One drink equals 12 ounces of beer, 5 ounces of wine, or 1 ounces of hard liquor.  Do not use street drugs.  Do not share needles.  Ask your health care provider for help if you need support or information about quitting drugs.  Tell your health care provider if you often feel depressed.  Tell your health care provider if you have ever been abused or do not feel safe at home. Document Released: 08/12/2010 Document Revised: 06/13/2013 Document Reviewed: 12/29/2012 [facility] Patient Information 2015 [place], [place]. This information is not intended to replace advice given to you by your health care provider. Make sure you discuss any questions you have with your health care provider.

## 2015-06-20 NOTE — Progress Notes (Signed)
    Brandy Guerrero [birth year] [id number]        65 y.o.  G2P2  for annual exam.  Has not been in the office for over 3 years. History of pelvic mass that she has refused to have removed. Having no complaints today but resents for a routine exam.  Past medical history,surgical history, problem list, medications, allergies, family history and social history were all reviewed and documented as reviewed in the EPIC chart.  ROS:  Performed with pertinent positives and negatives included in the history, assessment and plan.   Additional significant findings :  none   Exam: Caryn Bee assistant Filed Vitals:   06/20/15 1406  BP: 124/78  Height: 5' 3.5" (1.613 m)  Weight: 199 lb (90.266 kg)   General appearance:  Normal affect, orientation and appearance. Skin: Grossly normal HEENT: Without gross lesions.  No cervical or supraclavicular adenopathy. Thyroid normal.  Lungs:  Clear without wheezing, rales or rhonchi Cardiac: RR, without RMG Abdominal:  Soft, nontender, without masses, guarding, rebound, organomegaly. 2 cm easily reducible umbilical hernia Breasts:  Examined lying and sitting without masses, retractions, discharge or axillary adenopathy. Pelvic:  Ext/BUS/vagina with atrophic changes  Adnexa with grapefruit sized firm nontender mass midline to the left of midline.  Anus and perineum normal   Rectovaginal normal sphincter tone without palpated masses or tenderness.    Assessment/Plan:  65 y.o. G2P2 female for annual exam.  1. Complex solid pelvic mass initially visualized on ultrasound 2011. CA-125 5.8 at that time. Saw gynecologic oncologist who recommended surgery but the patient failed to follow up with her. Represented to me in 2013. CT scan showed a bilobed pelvic mass with upper lobe measuring 6 x 4.8 x 4.1 cm. The lower mass measuring 11.4 x 8.9 x 9.6 cm. Appears solid although has some heterogeneous elements. Again strongly recommended that she have this removed with a  gynecologic oncologist and she declined.  Recently saw her primary physician Dr. Danise Mina who had been recommending that this be addressed and she failed to follow up until now. Exam shows large pelvic mass consistent with her prior exams. Given the stability over years time unlikely malignant but reviewed with patient no guarantee unless removed. She is asymptomatic from the mass and she does not want to have this removed. She clearly understands the issues and the risks that this could be cancer but refuses to do any further studies or to have it removed. 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 Not having any issues as far as hot flashes, night sweats or vaginal dryness. Currently not sexually active. 2. Mammography 2015. Patient promises to schedule and follow up for this. SBE monthly reviewed. 3. DEXA 2013 normal. Will plan repeat DEXA at age 31. Increase calcium vitamin D reviewed. 4. Colonoscopy 2013. Repeat at their recommended interval. 5. Pap smear 2013. Pap smear of vaginal cuff today. No history of abnormal Pap smears previously. 6. Umbilical hernia, easily reduced. Present for years and not bothersome to the patient. Continue monitor report any issues. 7. Health maintenance. No routine lab work done as this is done at Dr. Danise Mina office. Follow up in one year, sooner if she changes her mind and wants to have this mass surgically removed.   Anastasio Auerbach MD, 2:23 PM 06/20/2015

## 2015-06-20 NOTE — Addendum Note (Signed)
Addended by: Nelva Nay on: 06/20/2015 02:56 PM   Modules accepted: Orders, SmartSet

## 2015-06-21 LAB — PAP IG W/ RFLX HPV ASCU

## 2015-07-20 ENCOUNTER — Ambulatory Visit
Admission: RE | Admit: 2015-07-20 | Discharge: 2015-07-20 | Disposition: A | Discharge status: Home / Self Care | Payer: Commercial Managed Care - PPO | Source: Ambulatory Visit

## 2015-07-20 DIAGNOSIS — Z1231 Encounter for screening mammogram for malignant neoplasm of breast: Secondary | ICD-10-CM

## 2015-07-25 ENCOUNTER — Other Ambulatory Visit: Payer: Self-pay | Admitting: Family Medicine

## 2015-07-25 DIAGNOSIS — R928 Other abnormal and inconclusive findings on diagnostic imaging of breast: Secondary | ICD-10-CM

## 2015-08-17 ENCOUNTER — Ambulatory Visit
Admission: RE | Admit: 2015-08-17 | Discharge: 2015-08-17 | Disposition: A | Discharge status: Home / Self Care | Payer: Commercial Managed Care - PPO | Source: Ambulatory Visit | Attending: Family Medicine | Admitting: Family Medicine

## 2015-08-17 DIAGNOSIS — R928 Other abnormal and inconclusive findings on diagnostic imaging of breast: Secondary | ICD-10-CM

## 2015-10-03 ENCOUNTER — Other Ambulatory Visit: Payer: Self-pay | Admitting: Family Medicine

## 2015-10-03 DIAGNOSIS — Z1159 Encounter for screening for other viral diseases: Secondary | ICD-10-CM

## 2015-10-03 DIAGNOSIS — E669 Obesity, unspecified: Secondary | ICD-10-CM

## 2015-10-03 DIAGNOSIS — R7303 Prediabetes: Secondary | ICD-10-CM

## 2015-10-03 DIAGNOSIS — I480 Paroxysmal atrial fibrillation: Secondary | ICD-10-CM

## 2015-10-03 DIAGNOSIS — R1900 Intra-abdominal and pelvic swelling, mass and lump, unspecified site: Secondary | ICD-10-CM

## 2015-10-03 DIAGNOSIS — E785 Hyperlipidemia, unspecified: Secondary | ICD-10-CM

## 2015-10-04 ENCOUNTER — Other Ambulatory Visit (INDEPENDENT_AMBULATORY_CARE_PROVIDER_SITE_OTHER): Payer: Commercial Managed Care - PPO

## 2015-10-04 ENCOUNTER — Encounter: Payer: Commercial Managed Care - PPO | Admitting: Family Medicine

## 2015-10-04 DIAGNOSIS — E669 Obesity, unspecified: Secondary | ICD-10-CM

## 2015-10-04 DIAGNOSIS — Z1159 Encounter for screening for other viral diseases: Secondary | ICD-10-CM

## 2015-10-04 DIAGNOSIS — E785 Hyperlipidemia, unspecified: Secondary | ICD-10-CM

## 2015-10-04 DIAGNOSIS — R1900 Intra-abdominal and pelvic swelling, mass and lump, unspecified site: Secondary | ICD-10-CM | POA: Diagnosis not present

## 2015-10-04 DIAGNOSIS — R635 Abnormal weight gain: Secondary | ICD-10-CM | POA: Diagnosis not present

## 2015-10-04 DIAGNOSIS — E66811 Obesity, class 1: Secondary | ICD-10-CM

## 2015-10-04 DIAGNOSIS — I480 Paroxysmal atrial fibrillation: Secondary | ICD-10-CM | POA: Diagnosis not present

## 2015-10-04 DIAGNOSIS — R7303 Prediabetes: Secondary | ICD-10-CM

## 2015-10-04 LAB — LIPID PANEL
CHOLESTEROL: 181 mg/dL (ref 0–200)
HDL: 47.6 mg/dL (ref >39.00)
LDL Cholesterol: 118 mg/dL — ABNORMAL HIGH (ref 0–99)
NonHDL: 133.34
Total CHOL/HDL Ratio: 4
Triglycerides: 79 mg/dL (ref 0.0–149.0)
VLDL: 15.8 mg/dL (ref 0.0–40.0)

## 2015-10-04 LAB — BASIC METABOLIC PANEL
BUN: 13 mg/dL (ref 6–23)
CHLORIDE: 106 meq/L (ref 96–112)
CO2: 31 mEq/L (ref 19–32)
Calcium: 9.1 mg/dL (ref 8.4–10.5)
Creatinine, Ser: 0.69 mg/dL (ref 0.40–1.20)
GFR: 110.2 mL/min (ref >60.00)
Glucose, Bld: 129 mg/dL — ABNORMAL HIGH (ref 70–99)
POTASSIUM: 4.3 meq/L (ref 3.5–5.1)
Sodium: 142 mEq/L (ref 135–145)

## 2015-10-04 LAB — HEMOGLOBIN A1C: HEMOGLOBIN A1C: 6.4 % (ref 4.6–6.5)

## 2015-10-04 LAB — HEPATIC FUNCTION PANEL
ALK PHOS: 113 U/L (ref 39–117)
ALT: 12 U/L (ref 0–35)
AST: 14 U/L (ref 0–37)
Albumin: 4.1 g/dL (ref 3.5–5.2)
BILIRUBIN DIRECT: 0.1 mg/dL (ref 0.0–0.3)
BILIRUBIN TOTAL: 0.6 mg/dL (ref 0.2–1.2)
Total Protein: 7.1 g/dL (ref 6.0–8.3)

## 2015-10-04 LAB — CBC WITH DIFFERENTIAL/PLATELET
BASOS ABS: 0.1 10*3/uL (ref 0.0–0.1)
BASOS PCT: 0.6 % (ref 0.0–3.0)
EOS ABS: 0.2 10*3/uL (ref 0.0–0.7)
EOS PCT: 2.2 % (ref 0.0–5.0)
HEMATOCRIT: 38.3 % (ref 36.0–46.0)
Hemoglobin: 12.6 g/dL (ref 12.0–15.0)
LYMPHS ABS: 2.1 10*3/uL (ref 0.7–4.0)
Lymphocytes Relative: 22.8 % (ref 12.0–46.0)
MCHC: 32.9 g/dL (ref 30.0–36.0)
MCV: 91.1 fl (ref 78.0–100.0)
MONOS PCT: 6.2 % (ref 3.0–12.0)
Monocytes Absolute: 0.6 10*3/uL (ref 0.1–1.0)
NEUTROS PCT: 68.2 % (ref 43.0–77.0)
Neutro Abs: 6.2 10*3/uL (ref 1.4–7.7)
PLATELETS: 270 10*3/uL (ref 150.0–400.0)
RBC: 4.21 Mil/uL (ref 3.87–5.11)
RDW: 13.5 % (ref 11.5–15.5)
WBC: 9.1 10*3/uL (ref 4.0–10.5)

## 2015-10-05 LAB — TSH: TSH: 1.54 u[IU]/mL (ref 0.35–4.50)

## 2015-10-05 LAB — HEPATITIS C ANTIBODY: HCV AB: NEGATIVE

## 2015-10-08 ENCOUNTER — Encounter: Payer: Self-pay | Admitting: Family Medicine

## 2015-10-08 ENCOUNTER — Ambulatory Visit (INDEPENDENT_AMBULATORY_CARE_PROVIDER_SITE_OTHER): Payer: Commercial Managed Care - PPO | Admitting: Family Medicine

## 2015-10-08 VITALS — BP 124/70 | HR 76 | Temp 99.1°F | Ht 63.0 in | Wt 203.5 lb

## 2015-10-08 DIAGNOSIS — I480 Paroxysmal atrial fibrillation: Secondary | ICD-10-CM

## 2015-10-08 DIAGNOSIS — R1900 Intra-abdominal and pelvic swelling, mass and lump, unspecified site: Secondary | ICD-10-CM

## 2015-10-08 DIAGNOSIS — R7303 Prediabetes: Secondary | ICD-10-CM

## 2015-10-08 DIAGNOSIS — Z0000 Encounter for general adult medical examination without abnormal findings: Secondary | ICD-10-CM | POA: Diagnosis not present

## 2015-10-08 DIAGNOSIS — E785 Hyperlipidemia, unspecified: Secondary | ICD-10-CM | POA: Diagnosis not present

## 2015-10-08 DIAGNOSIS — IMO0001 Reserved for inherently not codable concepts without codable children: Secondary | ICD-10-CM

## 2015-10-08 NOTE — Progress Notes (Addendum)
BP 124/70   Pulse 76   Temp 99.1 F (37.3 C) (Oral)   Ht 5\' 3"  (1.6 m)   Wt 203 lb 8 oz (92.3 kg)   BMI 36.05 kg/m    CC: CPE Subjective:    Patient ID: Brandy Guerrero, female    DOB: 1951-01-09, 65 y.o.   MRN: [id number]  HPI: Brandy Guerrero is a 65 y.o. female presenting on 10/08/2015 for Annual Exam   Pelvic mass - never returned to see Dr Phineas Real. Continues to decline further eval of pelvic mass due to financial burden. Denies new sxs.   H/o paroxy afib. Still intermittently with afib flares. Takes metoprolol prn. Has been on coumadin in past. Self stopped. Prior saw Dr. Claiborne Billings cardiologist. Hiram Comber score = 1, 1.3% risk of stroke off coumadin. Endorses ongoing intermittent palpitations.   Obesity - prior on belviq. Not effectively losing weight.   Preventative: Colon cancer screening - iFOB positive (2014), s/p colonoscopy [name]) with diverticulosis, but no reason for bleed found. Rpt stool cards last year negative. Otherwise rpt colonoscopy 10 yrs. Well woman with [name] - s/p TAH for fibroids, one ovary remains. Latest saw GYN 06/2015.  DEXA 2013 WNL.  Mammogram 08/2015. Birads3, rec rpt dx L mammo in 6 months.  Flu shot - at work Tdap - 2013 zostavax - will check with insurance Seat belt use discussed.  No changing moles on skin.   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  Activity: starting regular walking - 3 mi about 2d/wk  Diet: good water, fruits/vegetables daily   Relevant past medical, surgical, family and social history reviewed and updated as indicated. Interim medical history since our last visit reviewed. Allergies and medications reviewed and updated. Current Outpatient Prescriptions on File Prior to Visit  Medication Sig  . aspirin 81 MG tablet Take 81 mg by mouth daily.  Marland Kitchen doxycycline (VIBRAMYCIN) 100 MG  capsule Take 100 mg by mouth daily. For face  . FLUOCINOLONE ACETONIDE SCALP 0.01 % external oil as directed.  Marland Kitchen ketoconazole (NIZORAL) 2 % shampoo as directed.  . Lorcaserin HCl (BELVIQ) 10 MG TABS Take 1 tablet by mouth 2 (two) times daily.  . metoprolol tartrate (LOPRESSOR) 25 MG tablet Take 1 tablet (25 mg total) by mouth 2 (two) times daily as needed. Heart fluttering  . NON FORMULARY Hair and nail vitamin  . simvastatin (ZOCOR) 40 MG tablet TAKE ONE TABLET BY MOUTH ONCE DAILY AT  SUPPER   No current facility-administered medications on file prior to visit.     Review of Systems  Constitutional: Negative for activity change, appetite change, chills, fatigue, fever and unexpected weight change.  HENT: Negative for hearing loss.   Eyes: Negative for visual disturbance.  Respiratory: Negative for cough, chest tightness, shortness of breath and wheezing.   Cardiovascular: Negative for chest pain, palpitations and leg swelling.  Gastrointestinal: Negative for abdominal distention, abdominal pain, blood in stool, constipation, diarrhea, nausea and vomiting.  Genitourinary: Negative for difficulty urinating and hematuria.  Musculoskeletal: Negative for arthralgias, myalgias and neck pain.  Skin: Negative for rash.  Neurological: Positive for dizziness. Negative for seizures, syncope and headaches.  Hematological: Negative for adenopathy. Does not bruise/bleed easily.  Psychiatric/Behavioral: Negative for dysphoric mood. The patient is not nervous/anxious.    Per HPI unless specifically indicated in ROS section     Objective:  BP 124/70   Pulse 76   Temp 99.1 F (37.3 C) (Oral)   Ht 5\' 3"  (1.6 m)   Wt 203 lb 8 oz (92.3 kg)   BMI 36.05 kg/m   Wt Readings from Last 3 Encounters:  10/08/15 203 lb 8 oz (92.3 kg)  06/20/15 199 lb (90.3 kg)  05/29/15 200 lb (90.7 kg)    Physical Exam  Constitutional: She is oriented to person, place, and time. She appears well-developed and  well-nourished. No distress.  HENT:  Head: Normocephalic and atraumatic.  Right Ear: Hearing, tympanic membrane, external ear and ear canal normal.  Left Ear: Hearing, tympanic membrane, external ear and ear canal normal.  Nose: Nose normal.  Mouth/Throat: Uvula is midline, oropharynx is clear and moist and mucous membranes are normal. No oropharyngeal exudate, posterior oropharyngeal edema or posterior oropharyngeal erythema.  Eyes: Conjunctivae and EOM are normal. Pupils are equal, round, and reactive to light. No scleral icterus.  Neck: Normal range of motion. Neck supple. No thyromegaly present.  Cardiovascular: Normal rate, regular rhythm, normal heart sounds and intact distal pulses.   No murmur heard. Pulses:      Radial pulses are 2+ on the right side, and 2+ on the left side.  Pulmonary/Chest: Effort normal and breath sounds normal. No respiratory distress. She has no wheezes. She has no rales.  Abdominal: Soft. Bowel sounds are normal. She exhibits no distension and no mass. There is no tenderness. There is no rebound and no guarding.  Musculoskeletal: Normal range of motion. She exhibits no edema.  Lymphadenopathy:    She has no cervical adenopathy.  Neurological: She is alert and oriented to person, place, and time.  CN grossly intact, station and gait intact  Skin: Skin is warm and dry. No rash noted.  Psychiatric: She has a normal mood and affect. Her behavior is normal. Judgment and thought content normal.  Nursing note and vitals reviewed.  Results for orders placed or performed in visit on 10/04/15  TSH  Result Value Ref Range   TSH 1.54 0.35 - 4.50 uIU/mL      Assessment & Plan:   Problem List Items Addressed This Visit    Healthcare maintenance - Primary    Preventative protocols reviewed and updated unless pt declined. Discussed healthy diet and lifestyle.       HLD (hyperlipidemia)    Chronic, stable. Continue simvastatin daily.       Obesity, Class II,  BMI 35-39.9, with comorbidity (HCC)    Intermittent belviq. Not regular with this.  Weight gain noted.      Paroxysmal atrial fibrillation (HCC)    Unclear if truly afib. Update EKG today. Continue aspirin daily and metoprolol PRN. EKG - NSR rate 70, normal axis, intervals, no acute ST/T changes.      Relevant Orders   EKG 12-Lead (Completed)   Pelvic mass    Declines resection. Followed by GYN.       Prediabetes    Discussed elevated trend. Encouraged avoiding added sugars in diet.        Other Visit Diagnoses   None.      Follow up plan: Return in about 6 months (around 04/09/2016), or as needed, for follow up visit.  Ria Bush, MD

## 2015-10-08 NOTE — Assessment & Plan Note (Addendum)
Intermittent belviq. Not regular with this.  Weight gain noted.

## 2015-10-08 NOTE — Addendum Note (Signed)
Addended by: Royann Shivers A on: 10/08/2015 03:04 PM   Modules accepted: Orders

## 2015-10-08 NOTE — Progress Notes (Signed)
Pre visit review using our clinic review tool, if applicable. No additional management support is needed unless otherwise documented below in the visit note. 

## 2015-10-08 NOTE — Assessment & Plan Note (Signed)
Declines resection. Followed by GYN.

## 2015-10-08 NOTE — Assessment & Plan Note (Addendum)
Unclear if truly afib. Update EKG today. Continue aspirin daily and metoprolol PRN. EKG - NSR rate 70, normal axis, intervals, no acute ST/T changes.

## 2015-10-08 NOTE — Assessment & Plan Note (Signed)
Preventative protocols reviewed and updated unless pt declined. Discussed healthy diet and lifestyle.  

## 2015-10-08 NOTE — Assessment & Plan Note (Signed)
Chronic, stable. Continue simvastatin daily.

## 2015-10-08 NOTE — Assessment & Plan Note (Signed)
Discussed elevated trend. Encouraged avoiding added sugars in diet.

## 2015-10-08 NOTE — Patient Instructions (Addendum)
You are doing well today Watch added sugars as your are borderline diabetic.  EKG today Return as needed or in 6 months for sugar recheck. Good to see you today, call us with questions.  Health Maintenance, Female Adopting a healthy lifestyle and getting preventive care can go a long way to promote health and wellness. Talk with your health care provider about what schedule of regular examinations is right for you. This is a good chance for you to check in with your provider about disease prevention and staying healthy. In between checkups, there are plenty of things you can do on your own. Experts have done a lot of research about which lifestyle changes and preventive measures are most likely to keep you healthy. Ask your health care provider for more information. WEIGHT AND DIET  Eat a healthy diet  Be sure to include plenty of vegetables, fruits, low-fat dairy products, and lean protein.  Do not eat a lot of foods high in solid fats, added sugars, or salt.  Get regular exercise. This is one of the most important things you can do for your health.  Most adults should exercise for at least 150 minutes each week. The exercise should increase your heart rate and make you sweat (moderate-intensity exercise).  Most adults should also do strengthening exercises at least twice a week. This is in addition to the moderate-intensity exercise.  Maintain a healthy weight  Body mass index (BMI) is a measurement that can be used to identify possible weight problems. It estimates body fat based on height and weight. Your health care provider can help determine your BMI and help you achieve or maintain a healthy weight.  For females 44 years of age and older:   A BMI below 18.5 is considered underweight.  A BMI of 18.5 to 24.9 is normal.  A BMI of 25 to 29.9 is considered overweight.  A BMI of 30 and above is considered obese.  Watch levels of cholesterol and blood lipids  You should start  having your blood tested for lipids and cholesterol at 64 years of age, then have this test every 5 years.  You may need to have your cholesterol levels checked more often if:  Your lipid or cholesterol levels are high.  You are older than 64 years of age.  You are at high risk for heart disease.  CANCER SCREENING   Lung Cancer  Lung cancer screening is recommended for adults 1-39 years old who are at high risk for lung cancer because of a history of smoking.  A yearly low-dose CT scan of the lungs is recommended for people who:  Currently smoke.  Have quit within the past 15 years.  Have at least a 30-pack-year history of smoking. A pack year is smoking an average of one pack of cigarettes a day for 1 year.  Yearly screening should continue until it has been 15 years since you quit.  Yearly screening should stop if you develop a health problem that would prevent you from having lung cancer treatment.  Breast Cancer  Practice breast self-awareness. This means understanding how your breasts normally appear and feel.  It also means doing regular breast self-exams. Let your health care provider know about any changes, no matter how small.  If you are in your 20s or 30s, you should have a clinical breast exam (CBE) by a health care provider every 1-3 years as part of a regular health exam.  If you are 40 or older,  have a CBE every year. Also consider having a breast X-ray (mammogram) every year.  If you have a family history of breast cancer, talk to your health care provider about genetic screening.  If you are at high risk for breast cancer, talk to your health care provider about having an MRI and a mammogram every year.  Breast cancer gene (BRCA) assessment is recommended for women who have family members with BRCA-related cancers. BRCA-related cancers include:  Breast.  Ovarian.  Tubal.  Peritoneal cancers.  Results of the assessment will determine the need for  genetic counseling and BRCA1 and BRCA2 testing. Cervical Cancer Your health care provider may recommend that you be screened regularly for cancer of the pelvic organs (ovaries, uterus, and vagina). This screening involves a pelvic examination, including checking for microscopic changes to the surface of your cervix (Pap test). You may be encouraged to have this screening done every 3 years, beginning at age 40.  For women ages 23-65, health care providers may recommend pelvic exams and Pap testing every 3 years, or they may recommend the Pap and pelvic exam, combined with testing for human papilloma virus (HPV), every 5 years. Some types of HPV increase your risk of cervical cancer. Testing for HPV may also be done on women of any age with unclear Pap test results.  Other health care providers may not recommend any screening for nonpregnant women who are considered low risk for pelvic cancer and who do not have symptoms. Ask your health care provider if a screening pelvic exam is right for you.  If you have had past treatment for cervical cancer or a condition that could lead to cancer, you need Pap tests and screening for cancer for at least 20 years after your treatment. If Pap tests have been discontinued, your risk factors (such as having a new sexual partner) need to be reassessed to determine if screening should resume. Some women have medical problems that increase the chance of getting cervical cancer. In these cases, your health care provider may recommend more frequent screening and Pap tests. Colorectal Cancer  This type of cancer can be detected and often prevented.  Routine colorectal cancer screening usually begins at 64 years of age and continues through 64 years of age.  Your health care provider may recommend screening at an earlier age if you have risk factors for colon cancer.  Your health care provider may also recommend using home test kits to check for hidden blood in the  stool.  A small camera at the end of a tube can be used to examine your colon directly (sigmoidoscopy or colonoscopy). This is done to check for the earliest forms of colorectal cancer.  Routine screening usually begins at age 49.  Direct examination of the colon should be repeated every 5-10 years through 64 years of age. However, you may need to be screened more often if early forms of precancerous polyps or small growths are found. Skin Cancer  Check your skin from head to toe regularly.  Tell your health care provider about any new moles or changes in moles, especially if there is a change in a mole's shape or color.  Also tell your health care provider if you have a mole that is larger than the size of a pencil eraser.  Always use sunscreen. Apply sunscreen liberally and repeatedly throughout the day.  Protect yourself by wearing long sleeves, pants, a wide-brimmed hat, and sunglasses whenever you are outside. HEART DISEASE, DIABETES, AND  HIGH BLOOD PRESSURE   High blood pressure causes heart disease and increases the risk of stroke. High blood pressure is more likely to develop in:  People who have blood pressure in the high end of the normal range (130-139/85-89 mm Hg).  People who are overweight or obese.  People who are African American.  If you are 82-58 years of age, have your blood pressure checked every 3-5 years. If you are 8 years of age or older, have your blood pressure checked every year. You should have your blood pressure measured twice--once when you are at a hospital or clinic, and once when you are not at a hospital or clinic. Record the average of the two measurements. To check your blood pressure when you are not at a hospital or clinic, you can use:  An automated blood pressure machine at a pharmacy.  A home blood pressure monitor.  If you are between 75 years and 23 years old, ask your health care provider if you should take aspirin to prevent  strokes.  Have regular diabetes screenings. This involves taking a blood sample to check your fasting blood sugar level.  If you are at a normal weight and have a low risk for diabetes, have this test once every three years after 64 years of age.  If you are overweight and have a high risk for diabetes, consider being tested at a younger age or more often. PREVENTING INFECTION  Hepatitis B  If you have a higher risk for hepatitis B, you should be screened for this virus. You are considered at high risk for hepatitis B if:  You were born in a country where hepatitis B is common. Ask your health care provider which countries are considered high risk.  Your parents were born in a high-risk country, and you have not been immunized against hepatitis B (hepatitis B vaccine).  You have HIV or AIDS.  You use needles to inject street drugs.  You live with someone who has hepatitis B.  You have had sex with someone who has hepatitis B.  You get hemodialysis treatment.  You take certain medicines for conditions, including cancer, organ transplantation, and autoimmune conditions. Hepatitis C  Blood testing is recommended for:  Everyone born from [birth year] through 1964.  Anyone with known risk factors for hepatitis C. Sexually transmitted infections (STIs)  You should be screened for sexually transmitted infections (STIs) including gonorrhea and chlamydia if:  You are sexually active and are younger than 64 years of age.  You are older than 64 years of age and your health care provider tells you that you are at risk for this type of infection.  Your sexual activity has changed since you were last screened and you are at an increased risk for chlamydia or gonorrhea. Ask your health care provider if you are at risk.  If you do not have HIV, but are at risk, it may be recommended that you take a prescription medicine daily to prevent HIV infection. This is called pre-exposure prophylaxis  (PrEP). You are considered at risk if:  You are sexually active and do not regularly use condoms or know the HIV status of your partner(s).  You take drugs by injection.  You are sexually active with a partner who has HIV. Talk with your health care provider about whether you are at high risk of being infected with HIV. If you choose to begin PrEP, you should first be tested for HIV. You should then be tested  every 3 months for as long as you are taking PrEP.  PREGNANCY   If you are premenopausal and you may become pregnant, ask your health care provider about preconception counseling.  If you may become pregnant, take 400 to 800 micrograms (mcg) of folic acid every day.  If you want to prevent pregnancy, talk to your health care provider about birth control (contraception). OSTEOPOROSIS AND MENOPAUSE   Osteoporosis is a disease in which the bones lose minerals and strength with aging. This can result in serious bone fractures. Your risk for osteoporosis can be identified using a bone density scan.  If you are 58 years of age or older, or if you are at risk for osteoporosis and fractures, ask your health care provider if you should be screened.  Ask your health care provider whether you should take a calcium or vitamin D supplement to lower your risk for osteoporosis.  Menopause may have certain physical symptoms and risks.  Hormone replacement therapy may reduce some of these symptoms and risks. Talk to your health care provider about whether hormone replacement therapy is right for you.  HOME CARE INSTRUCTIONS   Schedule regular health, dental, and eye exams.  Stay current with your immunizations.   Do not use any tobacco products including cigarettes, chewing tobacco, or electronic cigarettes.  If you are pregnant, do not drink alcohol.  If you are breastfeeding, limit how much and how often you drink alcohol.  Limit alcohol intake to no more than 1 drink per day for  nonpregnant women. One drink equals 12 ounces of beer, 5 ounces of wine, or 1 ounces of hard liquor.  Do not use street drugs.  Do not share needles.  Ask your health care provider for help if you need support or information about quitting drugs.  Tell your health care provider if you often feel depressed.  Tell your health care provider if you have ever been abused or do not feel safe at home.   This information is not intended to replace advice given to you by your health care provider. Make sure you discuss any questions you have with your health care provider.   Document Released: 08/12/2010 Document Revised: 02/17/2014 Document Reviewed: 12/29/2012 Elsevier Interactive Patient Education [employer].

## 2015-10-10 ENCOUNTER — Encounter: Payer: Self-pay | Admitting: *Deleted

## 2015-12-04 ENCOUNTER — Encounter: Payer: Commercial Managed Care - PPO | Admitting: Family Medicine

## 2016-02-23 ENCOUNTER — Telehealth: Payer: Self-pay | Admitting: Family Medicine

## 2016-02-23 DIAGNOSIS — R928 Other abnormal and inconclusive findings on diagnostic imaging of breast: Secondary | ICD-10-CM

## 2016-02-23 NOTE — Telephone Encounter (Signed)
-----   Message from Ria Bush, MD sent at 08/17/2015 10:35 PM EDT ----- Rpt dx L mammo 6 mo

## 2016-02-23 NOTE — Telephone Encounter (Signed)
Time for rpt mammogram - ordered.

## 2016-03-07 NOTE — Telephone Encounter (Signed)
Called patient and mailbox is full, No DPR on file to call relative.

## 2016-03-18 ENCOUNTER — Encounter: Payer: Self-pay | Admitting: Family Medicine

## 2016-03-21 ENCOUNTER — Other Ambulatory Visit: Payer: Self-pay | Admitting: Family Medicine

## 2016-03-21 DIAGNOSIS — N6489 Other specified disorders of breast: Secondary | ICD-10-CM

## 2016-03-21 DIAGNOSIS — R921 Mammographic calcification found on diagnostic imaging of breast: Secondary | ICD-10-CM

## 2016-04-09 ENCOUNTER — Ambulatory Visit
Admission: RE | Admit: 2016-04-09 | Discharge: 2016-04-09 | Disposition: A | Discharge status: Home / Self Care | Payer: Commercial Managed Care - PPO | Source: Ambulatory Visit | Attending: Family Medicine | Admitting: Family Medicine

## 2016-04-09 ENCOUNTER — Other Ambulatory Visit: Payer: Self-pay | Admitting: Family Medicine

## 2016-04-09 ENCOUNTER — Ambulatory Visit: Payer: Commercial Managed Care - PPO | Admitting: Family Medicine

## 2016-04-09 DIAGNOSIS — N6489 Other specified disorders of breast: Secondary | ICD-10-CM

## 2016-04-09 DIAGNOSIS — R921 Mammographic calcification found on diagnostic imaging of breast: Secondary | ICD-10-CM

## 2016-05-05 ENCOUNTER — Encounter: Payer: Self-pay | Admitting: Family Medicine

## 2016-05-05 ENCOUNTER — Ambulatory Visit (INDEPENDENT_AMBULATORY_CARE_PROVIDER_SITE_OTHER): Payer: Commercial Managed Care - PPO | Admitting: Family Medicine

## 2016-05-05 VITALS — BP 116/76 | HR 76 | Temp 98.3°F | Wt 195.8 lb

## 2016-05-05 DIAGNOSIS — E669 Obesity, unspecified: Secondary | ICD-10-CM | POA: Diagnosis not present

## 2016-05-05 DIAGNOSIS — E785 Hyperlipidemia, unspecified: Secondary | ICD-10-CM

## 2016-05-05 DIAGNOSIS — IMO0001 Reserved for inherently not codable concepts without codable children: Secondary | ICD-10-CM

## 2016-05-05 DIAGNOSIS — R7303 Prediabetes: Secondary | ICD-10-CM

## 2016-05-05 LAB — HEMOGLOBIN A1C: Hgb A1c MFr Bld: 6.9 % — ABNORMAL HIGH (ref 4.6–6.5)

## 2016-05-05 MED ORDER — SIMVASTATIN 40 MG PO TABS: 40.0000 mg | ORAL_TABLET | Freq: Every day | ORAL | 3 refills | Status: DC

## 2016-05-05 MED ORDER — METOPROLOL TARTRATE 25 MG PO TABS: 25.0000 mg | ORAL_TABLET | Freq: Two times a day (BID) | ORAL | 1 refills | Status: DC | PRN

## 2016-05-05 NOTE — Progress Notes (Signed)
Pre visit review using our clinic review tool, if applicable. No additional management support is needed unless otherwise documented below in the visit note. 

## 2016-05-05 NOTE — Assessment & Plan Note (Signed)
Discussed diet changes to keep good glycemic control. Pt endorses healthy diet changes - update A1c today.

## 2016-05-05 NOTE — Assessment & Plan Note (Signed)
Weight gain noted. Congratulated. Pt endorses healthy diet changes that will be sustainable.  Doesn't regularly take belviq.

## 2016-05-05 NOTE — Progress Notes (Signed)
BP 116/76   Pulse 76   Temp 98.3 F (36.8 C) (Oral)   Wt 195 lb 12 oz (88.8 kg)   BMI 34.68 kg/m    CC: 6 mo f/u visit Subjective:    Patient ID: Brandy Guerrero, female    DOB: [birth year], 66 y.o.   MRN: [id number]  HPI: Brandy Guerrero is a 66 y.o. female presenting on 05/05/2016 for Follow-up   Prediabetes - see prior note for details.  Using herbalife, watching portion sizes and lost 8 lbs in the past 6 months.  HLD - ran out of simvastatin for last several months  Abnormal L mammo with cluster of calcifications inner breast pending biopsy 05/2016.   Relevant past medical, surgical, family and social history reviewed and updated as indicated. Interim medical history since our last visit reviewed. Allergies and medications reviewed and updated. Outpatient Medications Prior to Visit  Medication Sig Dispense Refill  . aspirin 81 MG tablet Take 81 mg by mouth daily.    Marland Kitchen FLUOCINOLONE ACETONIDE SCALP 0.01 % external oil as directed.    Marland Kitchen ketoconazole (NIZORAL) 2 % shampoo as directed.    . NON FORMULARY Hair and nail vitamin    . metoprolol tartrate (LOPRESSOR) 25 MG tablet Take 1 tablet (25 mg total) by mouth 2 (two) times daily as needed. Heart fluttering 60 tablet 1  . simvastatin (ZOCOR) 40 MG tablet TAKE ONE TABLET BY MOUTH ONCE DAILY AT  SUPPER 90 tablet 3  . Lorcaserin HCl (BELVIQ) 10 MG TABS Take 1 tablet by mouth 2 (two) times daily. (Patient not taking: Reported on 05/05/2016) 60 tablet 3  . doxycycline (VIBRAMYCIN) 100 MG capsule Take 100 mg by mouth daily. For face     No facility-administered medications prior to visit.      Per HPI unless specifically indicated in ROS section below Review of Systems     Objective:    BP 116/76   Pulse 76   Temp 98.3 F (36.8 C) (Oral)   Wt 195 lb 12 oz (88.8 kg)   BMI 34.68 kg/m   Wt Readings from Last 3 Encounters:  05/05/16 195 lb 12 oz (88.8 kg)  10/08/15 203 lb 8 oz (92.3 kg)  06/20/15 199 lb (90.3 kg)      Physical Exam  Constitutional: She appears well-developed and well-nourished. No distress.  HENT:  Mouth/Throat: Oropharynx is clear and moist. No oropharyngeal exudate.  Cardiovascular: Normal rate, regular rhythm, normal heart sounds and intact distal pulses.   No murmur heard. Pulmonary/Chest: Effort normal and breath sounds normal. No respiratory distress. She has no wheezes. She has no rales.  Musculoskeletal: She exhibits no edema.  Skin: Skin is warm and dry. No rash noted.  Psychiatric: She has a normal mood and affect.  Nursing note and vitals reviewed.  Results for orders placed or performed in visit on 10/04/15  TSH  Result Value Ref Range   TSH 1.54 0.35 - 4.50 uIU/mL   Lab Results  Component Value Date   HGBA1C 6.4 10/04/2015       Assessment & Plan:   Problem List Items Addressed This Visit    HLD (hyperlipidemia)    Chronic. Pt ran out of simvastatin over last few months. Refilled today.       Relevant Medications   simvastatin (ZOCOR) 40 MG tablet   metoprolol tartrate (LOPRESSOR) 25 MG tablet   Obesity, Class II, BMI 35-39.9, with comorbidity    Weight gain noted. Congratulated. Pt  endorses healthy diet changes that will be sustainable.  Doesn't regularly take belviq.       Prediabetes - Primary    Discussed diet changes to keep good glycemic control. Pt endorses healthy diet changes - update A1c today.       Relevant Orders   Hemoglobin A1c       Follow up plan: Return in about 6 months (around 11/05/2016) for follow up visit.  Ria Bush, MD

## 2016-05-05 NOTE — Assessment & Plan Note (Signed)
Chronic. Pt ran out of simvastatin over last few months. Refilled today.

## 2016-05-05 NOTE — Patient Instructions (Signed)
Simvastatin refilled. Check a1c today. Return as needed or in 6 months for physical.

## 2016-05-07 ENCOUNTER — Other Ambulatory Visit: Payer: Self-pay | Admitting: *Deleted

## 2016-05-07 MED ORDER — METFORMIN HCL 500 MG PO TABS: 500.0000 mg | ORAL_TABLET | Freq: Every day | ORAL | 1 refills | Status: DC

## 2016-05-23 ENCOUNTER — Ambulatory Visit
Admission: RE | Admit: 2016-05-23 | Discharge: 2016-05-23 | Disposition: A | Discharge status: Home / Self Care | Payer: Commercial Managed Care - PPO | Source: Ambulatory Visit | Attending: Family Medicine | Admitting: Family Medicine

## 2016-05-23 DIAGNOSIS — R921 Mammographic calcification found on diagnostic imaging of breast: Secondary | ICD-10-CM

## 2016-06-16 ENCOUNTER — Encounter: Payer: Self-pay | Admitting: General Surgery

## 2016-06-16 ENCOUNTER — Other Ambulatory Visit: Payer: Self-pay | Admitting: General Surgery

## 2016-06-17 ENCOUNTER — Encounter: Payer: Self-pay | Admitting: Family Medicine

## 2016-06-17 DIAGNOSIS — N6092 Unspecified benign mammary dysplasia of left breast: Secondary | ICD-10-CM | POA: Insufficient documentation

## 2016-07-01 ENCOUNTER — Ambulatory Visit (INDEPENDENT_AMBULATORY_CARE_PROVIDER_SITE_OTHER): Payer: Commercial Managed Care - PPO | Admitting: Family Medicine

## 2016-07-01 ENCOUNTER — Encounter (INDEPENDENT_AMBULATORY_CARE_PROVIDER_SITE_OTHER): Payer: Self-pay

## 2016-07-01 ENCOUNTER — Encounter: Payer: Self-pay | Admitting: Family Medicine

## 2016-07-01 VITALS — BP 118/76 | HR 94 | Temp 99.2°F | Ht 64.0 in | Wt 188.8 lb

## 2016-07-01 DIAGNOSIS — J209 Acute bronchitis, unspecified: Secondary | ICD-10-CM

## 2016-07-01 MED ORDER — AZITHROMYCIN 250 MG PO TABS: ORAL_TABLET | ORAL | 0 refills | Status: DC

## 2016-07-01 MED ORDER — GUAIFENESIN-CODEINE 100-10 MG/5ML PO SYRP: 5.0000 mL | ORAL_SOLUTION | Freq: Two times a day (BID) | ORAL | 0 refills | Status: DC | PRN

## 2016-07-01 NOTE — Patient Instructions (Addendum)
You have likely bronchitis Treat with codeine cough syrup for night time and take aleve 220mg  twice daily with meals for the next 5 days.  May continue mucinex. Push fluids and rest.  If not improving fill zpack antibiotic provided.   Acute Bronchitis, Adult Acute bronchitis is sudden (acute) swelling of the air tubes (bronchi) in the lungs. Acute bronchitis causes these tubes to fill with mucus, which can make it hard to breathe. It can also cause coughing or wheezing. In adults, acute bronchitis usually goes away within 2 weeks. A cough caused by bronchitis may last up to 3 weeks. Smoking, allergies, and asthma can make the condition worse. Repeated episodes of bronchitis may cause further lung problems, such as chronic obstructive pulmonary disease (COPD). What are the causes? This condition can be caused by germs and by substances that irritate the lungs, including:  Cold and flu viruses. This condition is most often caused by the same virus that causes a cold.  Bacteria.  Exposure to tobacco smoke, dust, fumes, and air pollution. What increases the risk? This condition is more likely to develop in people who:  Have close contact with someone with acute bronchitis.  Are exposed to lung irritants, such as tobacco smoke, dust, fumes, and vapors.  Have a weak immune system.  Have a respiratory condition such as asthma. What are the signs or symptoms? Symptoms of this condition include:  A cough.  Coughing up clear, yellow, or green mucus.  Wheezing.  Chest congestion.  Shortness of breath.  A fever.  Body aches.  Chills.  A sore throat. How is this diagnosed? This condition is usually diagnosed with a physical exam. During the exam, your health care provider may order tests, such as chest X-rays, to rule out other conditions. He or she may also:  Test a sample of your mucus for bacterial infection.  Check the level of oxygen in your blood. This is done to check  for pneumonia.  Do a chest X-ray or lung function testing to rule out pneumonia and other conditions.  Perform blood tests. Your health care provider will also ask about your symptoms and medical history. How is this treated? Most cases of acute bronchitis clear up over time without treatment. Your health care provider may recommend:  Drinking more fluids. Drinking more makes your mucus thinner, which may make it easier to breathe.  Taking a medicine for a fever or cough.  Taking an antibiotic medicine.  Using an inhaler to help improve shortness of breath and to control a cough.  Using a cool mist vaporizer or humidifier to make it easier to breathe. Follow these instructions at home: Medicines   Take over-the-counter and prescription medicines only as told by your health care provider.  If you were prescribed an antibiotic, take it as told by your health care provider. Do not stop taking the antibiotic even if you start to feel better. General instructions   Get plenty of rest.  Drink enough fluids to keep your urine clear or pale yellow.  Avoid smoking and secondhand smoke. Exposure to cigarette smoke or irritating chemicals will make bronchitis worse. If you smoke and you need help quitting, ask your health care provider. Quitting smoking will help your lungs heal faster.  Use an inhaler, cool mist vaporizer, or humidifier as told by your health care provider.  Keep all follow-up visits as told by your health care provider. This is important. How is this prevented? To lower your risk of getting  this condition again:  Wash your hands often with soap and water. If soap and water are not available, use hand sanitizer.  Avoid contact with people who have cold symptoms.  Try not to touch your hands to your mouth, nose, or eyes.  Make sure to get the flu shot every year. Contact a health care provider if:  Your symptoms do not improve in 2 weeks of treatment. Get help  right away if:  You cough up blood.  You have chest pain.  You have severe shortness of breath.  You become dehydrated.  You faint or keep feeling like you are going to faint.  You keep vomiting.  You have a severe headache.  Your fever or chills gets worse. This information is not intended to replace advice given to you by your health care provider. Make sure you discuss any questions you have with your health care provider. Document Released: 03/06/2004 Document Revised: 08/22/2015 Document Reviewed: 07/18/2015 Elsevier Interactive Patient Education  2017 [employer].

## 2016-07-01 NOTE — Assessment & Plan Note (Signed)
Anticipate viral. Discussed expected course of resolution. Treat with cheratussin, mucinex, aleve. WASP for zpack in case not improving with treatment Pt agrees with plan.

## 2016-07-01 NOTE — Progress Notes (Signed)
BP 118/76   Pulse 94   Temp 99.2 F (37.3 C)   Ht 5\' 4"  (1.626 m)   Wt 188 lb 12 oz (85.6 kg)   SpO2 96%   BMI 32.40 kg/m   Temp 99.2  CC: cough Subjective:    Patient ID: Brandy Guerrero, female    DOB: 1951/01/12, 66 y.o.   MRN: [id number]  HPI: Brandy Guerrero is a 66 y.o. female presenting on 07/01/2016 for Acute Visit ([place] --OTC [place], NYQUIL-- X 6 DAYS YELLOW PHELGM)   Recent trip to [facility].   6d h/o malaise, head and chest congestion, productive cough with chest discomfort. Started as allergies. Felt feverish earlier this week.   No fevers/chills, ear or tooth pain, PNdrainage. No dyspnea or wheezing.   Has tried nyquil and mucinex. No sick contacts. No h/o asthma. Non smoker  Relevant past medical, surgical, family and social history reviewed and updated as indicated. Interim medical history since our last visit reviewed. Allergies and medications reviewed and updated. Outpatient Medications Prior to Visit  Medication Sig Dispense Refill  . aspirin 81 MG tablet Take 81 mg by mouth daily.    Marland Kitchen FLUOCINOLONE ACETONIDE SCALP 0.01 % external oil as directed.    Marland Kitchen ketoconazole (NIZORAL) 2 % shampoo as directed.    . Lorcaserin HCl (BELVIQ) 10 MG TABS Take 1 tablet by mouth 2 (two) times daily. 60 tablet 3  . metFORMIN (GLUCOPHAGE) 500 MG tablet Take 1 tablet (500 mg total) by mouth daily with breakfast. 90 tablet 1  . metoprolol tartrate (LOPRESSOR) 25 MG tablet Take 1 tablet (25 mg total) by mouth 2 (two) times daily as needed. Heart fluttering 60 tablet 1  . NON FORMULARY Hair and nail vitamin    . simvastatin (ZOCOR) 40 MG tablet Take 1 tablet (40 mg total) by mouth daily at 6 PM. 90 tablet 3   No facility-administered medications prior to visit.      Per HPI unless specifically indicated in ROS section below Review of Systems     Objective:    BP 118/76   Pulse 94   Temp 99.2 F (37.3 C)   Ht 5\' 4"  (1.626 m)   Wt 188 lb 12 oz  (85.6 kg)   SpO2 96%   BMI 32.40 kg/m   Wt Readings from Last 3 Encounters:  07/01/16 188 lb 12 oz (85.6 kg)  05/05/16 195 lb 12 oz (88.8 kg)  10/08/15 203 lb 8 oz (92.3 kg)    Physical Exam  Constitutional: She appears well-developed and well-nourished. No distress.  HENT:  Head: Normocephalic and atraumatic.  Right Ear: Hearing, tympanic membrane, external ear and ear canal normal.  Left Ear: Hearing, tympanic membrane, external ear and ear canal normal.  Nose: Mucosal edema present. No rhinorrhea. Right sinus exhibits no maxillary sinus tenderness and no frontal sinus tenderness. Left sinus exhibits no maxillary sinus tenderness and no frontal sinus tenderness.  Mouth/Throat: Uvula is midline, oropharynx is clear and moist and mucous membranes are normal. No oropharyngeal exudate, posterior oropharyngeal edema, posterior oropharyngeal erythema or tonsillar abscesses.  Nasal mucosal pallor and edema  Eyes: Conjunctivae and EOM are normal. Pupils are equal, round, and reactive to light. No scleral icterus.  Neck: Normal range of motion. Neck supple.  Cardiovascular: Normal rate, regular rhythm, normal heart sounds and intact distal pulses.   No murmur heard. Pulmonary/Chest: Effort normal and breath sounds normal. No respiratory distress. She has no wheezes. She has  no rales.  Lungs clear Harsh cough present  Lymphadenopathy:    She has no cervical adenopathy.  Skin: Skin is warm and dry. No rash noted.  Nursing note and vitals reviewed.  Results for orders placed or performed in visit on 05/05/16  Hemoglobin A1c  Result Value Ref Range   Hgb A1c MFr Bld 6.9 (H) 4.6 - 6.5 %      Assessment & Plan:   Problem List Items Addressed This Visit    Acute bronchitis - Primary    Anticipate viral. Discussed expected course of resolution. Treat with cheratussin, mucinex, aleve. WASP for zpack in case not improving with treatment Pt agrees with plan.           Follow up  plan: Return if symptoms worsen or fail to improve.  Ria Bush, MD

## 2016-07-17 ENCOUNTER — Telehealth: Payer: Self-pay | Admitting: *Deleted

## 2016-07-17 NOTE — Telephone Encounter (Signed)
If just cough remains, likely post-infectious post-viral cough which can take a few weeks to go away.  Would continue codeine cough syrup at night time, and may take cough drops or OTC dimetapp during the day to help suppress cough. Continue plenty of fluids and rest.  Watch for worsening productive cough, fever and if this happens to let us know.

## 2016-07-17 NOTE — Telephone Encounter (Signed)
Spoke to pt who states she was seen 5/22 and given a zpak and cough suppressant. Pt states cough is still present and is wanting to know how she should proceed. All other s/s have subsided. pls advise

## 2016-07-18 ENCOUNTER — Other Ambulatory Visit: Payer: Self-pay | Admitting: Family Medicine

## 2016-07-18 MED ORDER — GUAIFENESIN-CODEINE 100-10 MG/5ML PO SYRP: 5.0000 mL | ORAL_SOLUTION | Freq: Two times a day (BID) | ORAL | 0 refills | Status: DC | PRN

## 2016-07-18 NOTE — Telephone Encounter (Addendum)
As far as I know we can phone in codeine as cough syrup - would call and ask to do this with pharmacist.  If unable to do, I've printed out for pt to pick up.

## 2016-07-18 NOTE — Telephone Encounter (Signed)
plz phone in cheratussin refill - ordered.

## 2016-07-18 NOTE — Telephone Encounter (Signed)
Patient called.  I let her know Dr.G's recommendations.  Patient said she used all of the cough medicine with codeine.  Patient uses [facility]. Patient can be reached at [phone].

## 2016-07-18 NOTE — Telephone Encounter (Signed)
Rx called into pharmacy pt is aware as instructed

## 2016-07-18 NOTE — Telephone Encounter (Signed)
Rx sent in by Dr. Darnell Level

## 2016-10-21 ENCOUNTER — Other Ambulatory Visit (INDEPENDENT_AMBULATORY_CARE_PROVIDER_SITE_OTHER): Payer: Commercial Managed Care - PPO

## 2016-10-21 ENCOUNTER — Other Ambulatory Visit: Payer: Self-pay | Admitting: Family Medicine

## 2016-10-21 DIAGNOSIS — R1900 Intra-abdominal and pelvic swelling, mass and lump, unspecified site: Secondary | ICD-10-CM | POA: Diagnosis not present

## 2016-10-21 DIAGNOSIS — E785 Hyperlipidemia, unspecified: Secondary | ICD-10-CM

## 2016-10-21 DIAGNOSIS — R7303 Prediabetes: Secondary | ICD-10-CM

## 2016-10-21 LAB — CBC WITH DIFFERENTIAL/PLATELET
BASOS PCT: 1 % (ref 0.0–3.0)
Basophils Absolute: 0.1 10*3/uL (ref 0.0–0.1)
EOS ABS: 0.1 10*3/uL (ref 0.0–0.7)
Eosinophils Relative: 2.2 % (ref 0.0–5.0)
HCT: 38.7 % (ref 36.0–46.0)
HEMOGLOBIN: 12.4 g/dL (ref 12.0–15.0)
LYMPHS PCT: 27.1 % (ref 12.0–46.0)
Lymphs Abs: 1.8 10*3/uL (ref 0.7–4.0)
MCHC: 32.2 g/dL (ref 30.0–36.0)
MCV: 93.6 fl (ref 78.0–100.0)
Monocytes Absolute: 0.4 10*3/uL (ref 0.1–1.0)
Monocytes Relative: 6.3 % (ref 3.0–12.0)
NEUTROS ABS: 4.2 10*3/uL (ref 1.4–7.7)
Neutrophils Relative %: 63.4 % (ref 43.0–77.0)
Platelets: 279 10*3/uL (ref 150.0–400.0)
RBC: 4.13 Mil/uL (ref 3.87–5.11)
RDW: 13.4 % (ref 11.5–15.5)
WBC: 6.6 10*3/uL (ref 4.0–10.5)

## 2016-10-21 LAB — LIPID PANEL
CHOL/HDL RATIO: 4
Cholesterol: 158 mg/dL (ref 0–200)
HDL: 43.2 mg/dL (ref >39.00)
LDL Cholesterol: 101 mg/dL — ABNORMAL HIGH (ref 0–99)
NonHDL: 114.38
TRIGLYCERIDES: 69 mg/dL (ref 0.0–149.0)
VLDL: 13.8 mg/dL (ref 0.0–40.0)

## 2016-10-21 LAB — HEMOGLOBIN A1C: Hgb A1c MFr Bld: 6.7 % — ABNORMAL HIGH (ref 4.6–6.5)

## 2016-10-21 LAB — BASIC METABOLIC PANEL
BUN: 16 mg/dL (ref 6–23)
CALCIUM: 9.3 mg/dL (ref 8.4–10.5)
CO2: 30 meq/L (ref 19–32)
CREATININE: 0.79 mg/dL (ref 0.40–1.20)
Chloride: 104 mEq/L (ref 96–112)
GFR: 93.95 mL/min (ref >60.00)
GLUCOSE: 153 mg/dL — AB (ref 70–99)
Potassium: 4.1 mEq/L (ref 3.5–5.1)
Sodium: 140 mEq/L (ref 135–145)

## 2016-10-27 ENCOUNTER — Ambulatory Visit (INDEPENDENT_AMBULATORY_CARE_PROVIDER_SITE_OTHER): Payer: Commercial Managed Care - PPO | Admitting: Family Medicine

## 2016-10-27 ENCOUNTER — Encounter: Payer: Self-pay | Admitting: Family Medicine

## 2016-10-27 VITALS — BP 122/62 | HR 76 | Temp 98.6°F | Ht 63.0 in | Wt 182.0 lb

## 2016-10-27 DIAGNOSIS — E669 Obesity, unspecified: Secondary | ICD-10-CM | POA: Diagnosis not present

## 2016-10-27 DIAGNOSIS — R1900 Intra-abdominal and pelvic swelling, mass and lump, unspecified site: Secondary | ICD-10-CM | POA: Diagnosis not present

## 2016-10-27 DIAGNOSIS — N6092 Unspecified benign mammary dysplasia of left breast: Secondary | ICD-10-CM

## 2016-10-27 DIAGNOSIS — E785 Hyperlipidemia, unspecified: Secondary | ICD-10-CM | POA: Diagnosis not present

## 2016-10-27 DIAGNOSIS — K429 Umbilical hernia without obstruction or gangrene: Secondary | ICD-10-CM | POA: Diagnosis not present

## 2016-10-27 DIAGNOSIS — Z0001 Encounter for general adult medical examination with abnormal findings: Secondary | ICD-10-CM | POA: Diagnosis not present

## 2016-10-27 DIAGNOSIS — E119 Type 2 diabetes mellitus without complications: Secondary | ICD-10-CM | POA: Diagnosis not present

## 2016-10-27 LAB — MICROALBUMIN / CREATININE URINE RATIO
CREATININE, U: 152.2 mg/dL
MICROALB/CREAT RATIO: 0.5 mg/g (ref 0.0–30.0)

## 2016-10-27 MED ORDER — METFORMIN HCL 500 MG PO TABS: 500.0000 mg | ORAL_TABLET | Freq: Every day | ORAL | 3 refills | Status: DC

## 2016-10-27 MED ORDER — SIMVASTATIN 40 MG PO TABS: 40.0000 mg | ORAL_TABLET | Freq: Every day | ORAL | 3 refills | Status: DC

## 2016-10-27 MED ORDER — METOPROLOL TARTRATE 25 MG PO TABS: 25.0000 mg | ORAL_TABLET | Freq: Two times a day (BID) | ORAL | 1 refills | Status: DC | PRN

## 2016-10-27 NOTE — Assessment & Plan Note (Signed)
Ongoing weight loss noted. Pt attributes to healthy diet and lifestyle changes. Concerning h/o of pelvic mass and now  Abnormal mammogram merits f/u - encouraged she follow up as recommended.

## 2016-10-27 NOTE — Patient Instructions (Addendum)
Flu shot at [facility].  If interested, check with pharmacy about new 2 shot shingles series (shingrix).  I do want you to keep follow up mammogram and follow up with surgeon later this year.  You are doing well today.  Return as needed or in 6 months for follow up visit on diabetes.   Health Maintenance, Female Adopting a healthy lifestyle and getting preventive care can go a long way to promote health and wellness. Talk with your health care provider about what schedule of regular examinations is right for you. This is a good chance for you to check in with your provider about disease prevention and staying healthy. In between checkups, there are plenty of things you can do on your own. Experts have done a lot of research about which lifestyle changes and preventive measures are most likely to keep you healthy. Ask your health care provider for more information. Weight and diet Eat a healthy diet  Be sure to include plenty of vegetables, fruits, low-fat dairy products, and lean protein.  Do not eat a lot of foods high in solid fats, added sugars, or salt.  Get regular exercise. This is one of the most important things you can do for your health. ? Most adults should exercise for at least 150 minutes each week. The exercise should increase your heart rate and make you sweat (moderate-intensity exercise). ? Most adults should also do strengthening exercises at least twice a week. This is in addition to the moderate-intensity exercise.  Maintain a healthy weight  Body mass index (BMI) is a measurement that can be used to identify possible weight problems. It estimates body fat based on height and weight. Your health care provider can help determine your BMI and help you achieve or maintain a healthy weight.  For females 15 years of age and older: ? A BMI below 18.5 is considered underweight. ? A BMI of 18.5 to 24.9 is normal. ? A BMI of 25 to 29.9 is considered overweight. ? A BMI of 30 and  above is considered obese.  Watch levels of cholesterol and blood lipids  You should start having your blood tested for lipids and cholesterol at 65 years of age, then have this test every 5 years.  You may need to have your cholesterol levels checked more often if: ? Your lipid or cholesterol levels are high. ? You are older than 65 years of age. ? You are at high risk for heart disease.  Cancer screening Lung Cancer  Lung cancer screening is recommended for adults 21-31 years old who are at high risk for lung cancer because of a history of smoking.  A yearly low-dose CT scan of the lungs is recommended for people who: ? Currently smoke. ? Have quit within the past 15 years. ? Have at least a 30-pack-year history of smoking. A pack year is smoking an average of one pack of cigarettes a day for 1 year.  Yearly screening should continue until it has been 15 years since you quit.  Yearly screening should stop if you develop a health problem that would prevent you from having lung cancer treatment.  Breast Cancer  Practice breast self-awareness. This means understanding how your breasts normally appear and feel.  It also means doing regular breast self-exams. Let your health care provider know about any changes, no matter how small.  If you are in your 20s or 30s, you should have a clinical breast exam (CBE) by a health care provider  every 1-3 years as part of a regular health exam.  If you are 41 or older, have a CBE every year. Also consider having a breast X-ray (mammogram) every year.  If you have a family history of breast cancer, talk to your health care provider about genetic screening.  If you are at high risk for breast cancer, talk to your health care provider about having an MRI and a mammogram every year.  Breast cancer gene (BRCA) assessment is recommended for women who have family members with BRCA-related cancers. BRCA-related cancers  include: ? Breast. ? Ovarian. ? Tubal. ? Peritoneal cancers.  Results of the assessment will determine the need for genetic counseling and BRCA1 and BRCA2 testing.  Cervical Cancer Your health care provider may recommend that you be screened regularly for cancer of the pelvic organs (ovaries, uterus, and vagina). This screening involves a pelvic examination, including checking for microscopic changes to the surface of your cervix (Pap test). You may be encouraged to have this screening done every 3 years, beginning at age 65.  For women ages 44-65, health care providers may recommend pelvic exams and Pap testing every 3 years, or they may recommend the Pap and pelvic exam, combined with testing for human papilloma virus (HPV), every 5 years. Some types of HPV increase your risk of cervical cancer. Testing for HPV may also be done on women of any age with unclear Pap test results.  Other health care providers may not recommend any screening for nonpregnant women who are considered low risk for pelvic cancer and who do not have symptoms. Ask your health care provider if a screening pelvic exam is right for you.  If you have had past treatment for cervical cancer or a condition that could lead to cancer, you need Pap tests and screening for cancer for at least 20 years after your treatment. If Pap tests have been discontinued, your risk factors (such as having a new sexual partner) need to be reassessed to determine if screening should resume. Some women have medical problems that increase the chance of getting cervical cancer. In these cases, your health care provider may recommend more frequent screening and Pap tests.  Colorectal Cancer  This type of cancer can be detected and often prevented.  Routine colorectal cancer screening usually begins at 65 years of age and continues through 65 years of age.  Your health care provider may recommend screening at an earlier age if you have risk factors  for colon cancer.  Your health care provider may also recommend using home test kits to check for hidden blood in the stool.  A small camera at the end of a tube can be used to examine your colon directly (sigmoidoscopy or colonoscopy). This is done to check for the earliest forms of colorectal cancer.  Routine screening usually begins at age 19.  Direct examination of the colon should be repeated every 5-10 years through 65 years of age. However, you may need to be screened more often if early forms of precancerous polyps or small growths are found.  Skin Cancer  Check your skin from head to toe regularly.  Tell your health care provider about any new moles or changes in moles, especially if there is a change in a mole's shape or color.  Also tell your health care provider if you have a mole that is larger than the size of a pencil eraser.  Always use sunscreen. Apply sunscreen liberally and repeatedly throughout the day.  Protect yourself by wearing long sleeves, pants, a wide-brimmed hat, and sunglasses whenever you are outside.  Heart disease, diabetes, and high blood pressure  High blood pressure causes heart disease and increases the risk of stroke. High blood pressure is more likely to develop in: ? People who have blood pressure in the high end of the normal range (130-139/85-89 mm Hg). ? People who are overweight or obese. ? People who are African American.  If you are 57-39 years of age, have your blood pressure checked every 3-5 years. If you are 19 years of age or older, have your blood pressure checked every year. You should have your blood pressure measured twice-once when you are at a hospital or clinic, and once when you are not at a hospital or clinic. Record the average of the two measurements. To check your blood pressure when you are not at a hospital or clinic, you can use: ? An automated blood pressure machine at a pharmacy. ? A home blood pressure monitor.  If  you are between 19 years and 38 years old, ask your health care provider if you should take aspirin to prevent strokes.  Have regular diabetes screenings. This involves taking a blood sample to check your fasting blood sugar level. ? If you are at a normal weight and have a low risk for diabetes, have this test once every three years after 65 years of age. ? If you are overweight and have a high risk for diabetes, consider being tested at a younger age or more often. Preventing infection Hepatitis B  If you have a higher risk for hepatitis B, you should be screened for this virus. You are considered at high risk for hepatitis B if: ? You were born in a country where hepatitis B is common. Ask your health care provider which countries are considered high risk. ? Your parents were born in a high-risk country, and you have not been immunized against hepatitis B (hepatitis B vaccine). ? You have HIV or AIDS. ? You use needles to inject street drugs. ? You live with someone who has hepatitis B. ? You have had sex with someone who has hepatitis B. ? You get hemodialysis treatment. ? You take certain medicines for conditions, including cancer, organ transplantation, and autoimmune conditions.  Hepatitis C  Blood testing is recommended for: ? Everyone born from [birth year] through 1964. ? Anyone with known risk factors for hepatitis C.  Sexually transmitted infections (STIs)  You should be screened for sexually transmitted infections (STIs) including gonorrhea and chlamydia if: ? You are sexually active and are younger than 65 years of age. ? You are older than 65 years of age and your health care provider tells you that you are at risk for this type of infection. ? Your sexual activity has changed since you were last screened and you are at an increased risk for chlamydia or gonorrhea. Ask your health care provider if you are at risk.  If you do not have HIV, but are at risk, it may be recommended  that you take a prescription medicine daily to prevent HIV infection. This is called pre-exposure prophylaxis (PrEP). You are considered at risk if: ? You are sexually active and do not regularly use condoms or know the HIV status of your partner(s). ? You take drugs by injection. ? You are sexually active with a partner who has HIV.  Talk with your health care provider about whether you are at high risk of  being infected with HIV. If you choose to begin PrEP, you should first be tested for HIV. You should then be tested every 3 months for as long as you are taking PrEP. Pregnancy  If you are premenopausal and you may become pregnant, ask your health care provider about preconception counseling.  If you may become pregnant, take 400 to 800 micrograms (mcg) of folic acid every day.  If you want to prevent pregnancy, talk to your health care provider about birth control (contraception). Osteoporosis and menopause  Osteoporosis is a disease in which the bones lose minerals and strength with aging. This can result in serious bone fractures. Your risk for osteoporosis can be identified using a bone density scan.  If you are 63 years of age or older, or if you are at risk for osteoporosis and fractures, ask your health care provider if you should be screened.  Ask your health care provider whether you should take a calcium or vitamin D supplement to lower your risk for osteoporosis.  Menopause may have certain physical symptoms and risks.  Hormone replacement therapy may reduce some of these symptoms and risks. Talk to your health care provider about whether hormone replacement therapy is right for you. Follow these instructions at home:  Schedule regular health, dental, and eye exams.  Stay current with your immunizations.  Do not use any tobacco products including cigarettes, chewing tobacco, or electronic cigarettes.  If you are pregnant, do not drink alcohol.  If you are  breastfeeding, limit how much and how often you drink alcohol.  Limit alcohol intake to no more than 1 drink per day for nonpregnant women. One drink equals 12 ounces of beer, 5 ounces of wine, or 1 ounces of hard liquor.  Do not use street drugs.  Do not share needles.  Ask your health care provider for help if you need support or information about quitting drugs.  Tell your health care provider if you often feel depressed.  Tell your health care provider if you have ever been abused or do not feel safe at home. This information is not intended to replace advice given to you by your health care provider. Make sure you discuss any questions you have with your health care provider. Document Released: 08/12/2010 Document Revised: 07/05/2015 Document Reviewed: 10/31/2014 Elsevier Interactive Patient Education  [employer].

## 2016-10-27 NOTE — Assessment & Plan Note (Signed)
Reducible

## 2016-10-27 NOTE — Assessment & Plan Note (Addendum)
Pt declined localized excision - states she will follow up with mammogram 11/2016. Encouraged she f/u with surgery 12/2016 as recommended as well.

## 2016-10-27 NOTE — Assessment & Plan Note (Signed)
Reviewed healthy diet and lifestyle changes to keep good control. Continue metformin 500mg  daily.

## 2016-10-27 NOTE — Assessment & Plan Note (Signed)
Preventative protocols reviewed and updated unless pt declined. Discussed healthy diet and lifestyle.  

## 2016-10-27 NOTE — Assessment & Plan Note (Signed)
Improving on statin. Continue. The 10-year ASCVD risk score Mikey Bussing DC Brooke Bonito., et al., 2013) is: 5.6%   Values used to calculate the score:     Age: 66 years     Sex: Female     Is Non-Hispanic African American: Yes     Diabetic: No     Tobacco smoker: No     Systolic Blood Pressure: 440 mmHg     Is BP treated: No     HDL Cholesterol: 43.2 mg/dL     Total Cholesterol: 158 mg/dL

## 2016-10-27 NOTE — Progress Notes (Signed)
BP 122/62 (BP Location: Left Arm, Patient Position: Sitting, Cuff Size: Normal)   Pulse 76   Temp 98.6 F (37 C) (Oral)   Ht 5\' 3"  (1.6 m)   Wt 182 lb (82.6 kg)   SpO2 98%   BMI 32.24 kg/m    CC: CPE Subjective:    Patient ID: Brandy Guerrero, female    DOB: 06/03/1950, 66 y.o.   MRN: [id number]  HPI: Brandy Guerrero is a 66 y.o. female presenting on 10/27/2016 for Annual Exam (has work for to be completed)   Some stressors with marriage.  Has metoprolol PRN palpitations - endorses h/o atrial fibrillation.  Continues losing weight - using herbalife.   Preventative: Colon cancer screening - iFOB positive (2014), s/p colonoscopy [name]) with diverticulosis, but no reason for bleed found. Rpt stool cards last year negative. Otherwise rpt colonoscopy 10 yrs. Well woman with [name] - s/p TAH for fibroids, one ovary remains. Latest saw GYN 06/2015.  Abnormal L mammo with cluster of calcifications inner breast 05/2016 - atypical ductal hyperplasia referred for excision (Dr Dalbert Batman) - pt declined excision and was recommended f/u 6 months in office (for 12/2016). She does plan to f/u at end of year with rpt mammo.  DEXA 2013 WNL.  Flu shot yearly at work Tdap 2013 Shingrix - discussed Seat belt use discussed.  Sunscreen use discussed. No changing moles on skin.  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  Activity: starting regular walking - 3 mi about 2d/wk  Diet: good water, fruits/vegetables daily   Relevant past medical, surgical, family and social history reviewed and updated as indicated. Interim medical history since our last visit reviewed. Allergies and medications reviewed and updated. Outpatient Medications Prior to Visit  Medication Sig Dispense Refill  . FLUOCINOLONE ACETONIDE SCALP 0.01 % external oil as directed.     Marland Kitchen ketoconazole (NIZORAL) 2 % shampoo as directed.    . metFORMIN (GLUCOPHAGE) 500 MG tablet Take 1 tablet (500 mg total) by mouth daily with breakfast. 90 tablet 1  . metoprolol tartrate (LOPRESSOR) 25 MG tablet Take 1 tablet (25 mg total) by mouth 2 (two) times daily as needed. Heart fluttering 60 tablet 1  . NON FORMULARY Hair and nail vitamin    . simvastatin (ZOCOR) 40 MG tablet Take 1 tablet (40 mg total) by mouth daily at 6 PM. 90 tablet 3  . azithromycin (ZITHROMAX) 250 MG tablet Take two tablets on day one followed by one tablet on days 2-5 6 each 0  . guaiFENesin-codeine (CHERATUSSIN AC) 100-10 MG/5ML syrup Take 5 mLs by mouth 2 (two) times daily as needed for cough (sedation precautions). 120 mL 0  . Lorcaserin HCl (BELVIQ) 10 MG TABS Take 1 tablet by mouth 2 (two) times daily. 60 tablet 3  . aspirin 81 MG tablet Take 81 mg by mouth daily.     No facility-administered medications prior to visit.      Per HPI unless specifically indicated in ROS section below Review of Systems  Constitutional: Negative for activity change, appetite change, chills, fatigue, fever and unexpected weight change.  HENT: Negative for hearing loss.   Eyes: Negative for visual disturbance.  Respiratory: Negative for cough, chest tightness, shortness of breath and wheezing.   Cardiovascular: Positive for palpitations (occasional). Negative for chest pain and leg swelling.  Gastrointestinal: Negative for abdominal distention, abdominal pain, blood in stool, constipation, diarrhea, nausea and vomiting.  Genitourinary: Negative for difficulty urinating and hematuria.  Musculoskeletal: Negative for arthralgias, myalgias and neck pain.  Skin: Negative for rash.  Neurological: Positive for dizziness. Negative for seizures, syncope and headaches.  Hematological: Negative for adenopathy. Bruises/bleeds easily.  Psychiatric/Behavioral: Negative for dysphoric mood. The patient is not nervous/anxious.         Objective:    BP 122/62 (BP Location: Left Arm, Patient Position: Sitting, Cuff Size: Normal)   Pulse 76   Temp 98.6 F (37 C) (Oral)   Ht 5\' 3"  (1.6 m)   Wt 182 lb (82.6 kg)   SpO2 98%   BMI 32.24 kg/m   Wt Readings from Last 3 Encounters:  10/27/16 182 lb (82.6 kg)  07/01/16 188 lb 12 oz (85.6 kg)  05/05/16 195 lb 12 oz (88.8 kg)    Physical Exam  Constitutional: She is oriented to person, place, and time. She appears well-developed and well-nourished. No distress.  HENT:  Head: Normocephalic and atraumatic.  Right Ear: Hearing, tympanic membrane, external ear and ear canal normal.  Left Ear: Hearing, tympanic membrane, external ear and ear canal normal.  Nose: Nose normal.  Mouth/Throat: Uvula is midline, oropharynx is clear and moist and mucous membranes are normal. No oropharyngeal exudate, posterior oropharyngeal edema or posterior oropharyngeal erythema.  Eyes: Pupils are equal, round, and reactive to light. Conjunctivae and EOM are normal. No scleral icterus.  Neck: Normal range of motion. Neck supple. No thyromegaly present.  Cardiovascular: Normal rate, regular rhythm, normal heart sounds and intact distal pulses.   No murmur heard. Pulses:      Radial pulses are 2+ on the right side, and 2+ on the left side.  Pulmonary/Chest: Effort normal and breath sounds normal. No respiratory distress. She has no wheezes. She has no rales.  Abdominal: Soft. Bowel sounds are normal. She exhibits no distension and no mass. There is no hepatosplenomegaly. There is no tenderness. There is no rebound and no guarding. A hernia (umbilical - reducible) is present.  Musculoskeletal: Normal range of motion. She exhibits no edema.  Lymphadenopathy:    She has no cervical adenopathy.  Neurological: She is alert and oriented to person, place, and time.  CN grossly intact, station and gait intact  Skin: Skin is warm and dry. No rash noted.  Psychiatric: She has a normal mood and affect. Her  behavior is normal. Judgment and thought content normal.  Nursing note and vitals reviewed.  Results for orders placed or performed in visit on 10/21/16  Lipid panel  Result Value Ref Range   Cholesterol 158 0 - 200 mg/dL   Triglycerides 69.0 0.0 - 149.0 mg/dL   HDL 43.20 >39.00 mg/dL   VLDL 13.8 0.0 - 40.0 mg/dL   LDL Cholesterol 101 (H) 0 - 99 mg/dL   Total CHOL/HDL Ratio 4    NonHDL 114.38   Hemoglobin A1c  Result Value Ref Range   Hgb A1c MFr Bld 6.7 (H) 4.6 - 6.5 %  CBC with Differential/Platelet  Result Value Ref Range   WBC 6.6 4.0 - 10.5 K/uL   RBC 4.13 3.87 - 5.11 Mil/uL   Hemoglobin 12.4 12.0 - 15.0 g/dL   HCT 38.7 36.0 - 46.0 %   MCV 93.6 78.0 - 100.0 fl   MCHC 32.2 30.0 - 36.0 g/dL   RDW 13.4 11.5 - 15.5 %   Platelets 279.0 150.0 - 400.0 K/uL  Neutrophils Relative % 63.4 43.0 - 77.0 %   Lymphocytes Relative 27.1 12.0 - 46.0 %   Monocytes Relative 6.3 3.0 - 12.0 %   Eosinophils Relative 2.2 0.0 - 5.0 %   Basophils Relative 1.0 0.0 - 3.0 %   Neutro Abs 4.2 1.4 - 7.7 K/uL   Lymphs Abs 1.8 0.7 - 4.0 K/uL   Monocytes Absolute 0.4 0.1 - 1.0 K/uL   Eosinophils Absolute 0.1 0.0 - 0.7 K/uL   Basophils Absolute 0.1 0.0 - 0.1 K/uL  Basic metabolic panel  Result Value Ref Range   Sodium 140 135 - 145 mEq/L   Potassium 4.1 3.5 - 5.1 mEq/L   Chloride 104 96 - 112 mEq/L   CO2 30 19 - 32 mEq/L   Glucose, Bld 153 (H) 70 - 99 mg/dL   BUN 16 6 - 23 mg/dL   Creatinine, Ser 0.79 0.40 - 1.20 mg/dL   Calcium 9.3 8.4 - 10.5 mg/dL   GFR 93.95 >60.00 mL/min      Assessment & Plan:   Problem List Items Addressed This Visit    Atypical ductal hyperplasia of left breast    Pt declined localized excision - states she will follow up with mammogram 11/2016. Encouraged she f/u with surgery 12/2016 as recommended as well.       Controlled type 2 diabetes mellitus without complication, without long-term current use of insulin ([place])    Reviewed healthy diet and lifestyle changes to  keep good control. Continue metformin 500mg  daily.       Relevant Orders   Microalbumin / creatinine urine ratio   Encounter for routine adult medical exam with abnormal findings - Primary    Preventative protocols reviewed and updated unless pt declined. Discussed healthy diet and lifestyle.       HLD (hyperlipidemia)    Improving on statin. Continue. The 10-year ASCVD risk score Mikey Bussing DC Brooke Bonito., et al., 2013) is: 5.6%   Values used to calculate the score:     Age: 62 years     Sex: Female     Is Non-Hispanic African American: Yes     Diabetic: No     Tobacco smoker: No     Systolic Blood Pressure: 604 mmHg     Is BP treated: No     HDL Cholesterol: 43.2 mg/dL     Total Cholesterol: 158 mg/dL       Obesity, Class I, BMI 30-34.9    Ongoing weight loss noted. Pt attributes to healthy diet and lifestyle changes. Concerning h/o of pelvic mass and now  Abnormal mammogram merits f/u - encouraged she follow up as recommended.       Pelvic mass    Has declined resection. I encouraged f/u with GYN.       Umbilical hernia    Reducible.           Follow up plan: Return in about 6 months (around 04/26/2017) for follow up visit.  Ria Bush, MD

## 2016-10-27 NOTE — Assessment & Plan Note (Signed)
Has declined resection. I encouraged f/u with GYN.

## 2016-10-27 NOTE — Addendum Note (Signed)
Addended by: Ria Bush on: 10/27/2016 10:25 AM   Modules accepted: Orders

## 2016-11-07 ENCOUNTER — Ambulatory Visit: Payer: Commercial Managed Care - PPO | Admitting: Family Medicine

## 2017-04-26 ENCOUNTER — Other Ambulatory Visit: Payer: Self-pay | Admitting: Family Medicine

## 2017-04-26 DIAGNOSIS — I480 Paroxysmal atrial fibrillation: Secondary | ICD-10-CM

## 2017-04-26 DIAGNOSIS — E119 Type 2 diabetes mellitus without complications: Secondary | ICD-10-CM

## 2017-04-26 DIAGNOSIS — E785 Hyperlipidemia, unspecified: Secondary | ICD-10-CM

## 2017-04-27 ENCOUNTER — Other Ambulatory Visit (INDEPENDENT_AMBULATORY_CARE_PROVIDER_SITE_OTHER): Payer: Commercial Managed Care - PPO

## 2017-04-27 DIAGNOSIS — I480 Paroxysmal atrial fibrillation: Secondary | ICD-10-CM | POA: Diagnosis not present

## 2017-04-27 DIAGNOSIS — E785 Hyperlipidemia, unspecified: Secondary | ICD-10-CM

## 2017-04-27 DIAGNOSIS — E119 Type 2 diabetes mellitus without complications: Secondary | ICD-10-CM | POA: Diagnosis not present

## 2017-04-27 LAB — CBC WITH DIFFERENTIAL/PLATELET
BASOS PCT: 1.2 % (ref 0.0–3.0)
Basophils Absolute: 0.1 10*3/uL (ref 0.0–0.1)
EOS ABS: 0.2 10*3/uL (ref 0.0–0.7)
EOS PCT: 3 % (ref 0.0–5.0)
HEMATOCRIT: 38.5 % (ref 36.0–46.0)
Hemoglobin: 12.8 g/dL (ref 12.0–15.0)
LYMPHS PCT: 26.3 % (ref 12.0–46.0)
Lymphs Abs: 1.8 10*3/uL (ref 0.7–4.0)
MCHC: 33.1 g/dL (ref 30.0–36.0)
MCV: 93.1 fl (ref 78.0–100.0)
MONOS PCT: 6.5 % (ref 3.0–12.0)
Monocytes Absolute: 0.5 10*3/uL (ref 0.1–1.0)
NEUTROS ABS: 4.4 10*3/uL (ref 1.4–7.7)
Neutrophils Relative %: 63 % (ref 43.0–77.0)
Platelets: 251 10*3/uL (ref 150.0–400.0)
RBC: 4.14 Mil/uL (ref 3.87–5.11)
RDW: 13.2 % (ref 11.5–15.5)
WBC: 6.9 10*3/uL (ref 4.0–10.5)

## 2017-04-27 LAB — COMPREHENSIVE METABOLIC PANEL
ALT: 13 U/L (ref 0–35)
AST: 15 U/L (ref 0–37)
Albumin: 4.2 g/dL (ref 3.5–5.2)
Alkaline Phosphatase: 108 U/L (ref 39–117)
BUN: 13 mg/dL (ref 6–23)
CALCIUM: 9.7 mg/dL (ref 8.4–10.5)
CHLORIDE: 104 meq/L (ref 96–112)
CO2: 30 mEq/L (ref 19–32)
CREATININE: 0.74 mg/dL (ref 0.40–1.20)
GFR: 101.16 mL/min (ref >60.00)
Glucose, Bld: 138 mg/dL — ABNORMAL HIGH (ref 70–99)
POTASSIUM: 3.9 meq/L (ref 3.5–5.1)
Sodium: 141 mEq/L (ref 135–145)
Total Bilirubin: 0.6 mg/dL (ref 0.2–1.2)
Total Protein: 7.7 g/dL (ref 6.0–8.3)

## 2017-04-27 LAB — HEMOGLOBIN A1C: Hgb A1c MFr Bld: 6.2 % (ref 4.6–6.5)

## 2017-04-27 LAB — TSH: TSH: 1.86 u[IU]/mL (ref 0.35–4.50)

## 2017-04-28 ENCOUNTER — Encounter: Payer: Self-pay | Admitting: Family Medicine

## 2017-04-28 ENCOUNTER — Ambulatory Visit: Payer: Commercial Managed Care - PPO | Admitting: Family Medicine

## 2017-04-28 VITALS — BP 120/68 | HR 65 | Temp 98.3°F | Wt 180.0 lb

## 2017-04-28 DIAGNOSIS — N6092 Unspecified benign mammary dysplasia of left breast: Secondary | ICD-10-CM | POA: Diagnosis not present

## 2017-04-28 DIAGNOSIS — E669 Obesity, unspecified: Secondary | ICD-10-CM

## 2017-04-28 DIAGNOSIS — Z23 Encounter for immunization: Secondary | ICD-10-CM

## 2017-04-28 DIAGNOSIS — E119 Type 2 diabetes mellitus without complications: Secondary | ICD-10-CM | POA: Diagnosis not present

## 2017-04-28 MED ORDER — METOPROLOL TARTRATE 25 MG PO TABS: 25.0000 mg | ORAL_TABLET | Freq: Two times a day (BID) | ORAL | 1 refills | Status: DC | PRN

## 2017-04-28 NOTE — Assessment & Plan Note (Addendum)
Declined localized excision. Overdue for rpt mammogram (11/2016)- encouraged she call and schedule this.

## 2017-04-28 NOTE — Patient Instructions (Addendum)
Continue current medicines and healthy diet.  Schedule repeat mammogram at the breast center [phone].  Schedule eye exam as you're due (check with [facility]).  Consider pneumonia shot.

## 2017-04-28 NOTE — Progress Notes (Signed)
BP 120/68 (BP Location: Left Arm, Patient Position: Sitting, Cuff Size: Normal)   Pulse 65   Temp 98.3 F (36.8 C) (Oral)   Wt 180 lb (81.6 kg)   SpO2 95%   BMI 31.89 kg/m    CC: 6 mo f/u visit Subjective:    Patient ID: Brandy Guerrero, female    DOB: [birth year], 67 y.o.   MRN: [id number]  HPI: Brandy Guerrero is a 68 y.o. female presenting on 04/28/2017 for 6 mo follow-up    Struggling with higher copays.   H/o atypical ductal hyperplasia on L mammo s/p biopsy 05/2016 - recommended excision and surgical eval - she declined localized excision, didn't return for f/u mammo.   She is using herbalife for weight loss. Also takes Alli OTC. Another 2 lbs down in the last 6 months.   Relevant past medical, surgical, family and social history reviewed and updated as indicated. Interim medical history since our last visit reviewed. Allergies and medications reviewed and updated. Outpatient Medications Prior to Visit  Medication Sig Dispense Refill  . aspirin 81 MG tablet Take 81 mg by mouth daily.    Marland Kitchen FLUOCINOLONE ACETONIDE SCALP 0.01 % external oil as directed.    Marland Kitchen ketoconazole (NIZORAL) 2 % shampoo as directed.    . metFORMIN (GLUCOPHAGE) 500 MG tablet Take 1 tablet (500 mg total) by mouth daily with breakfast. 90 tablet 3  . NON FORMULARY Hair and nail vitamin    . orlistat (ALLI) 60 MG capsule Take 60 mg by mouth 2 (two) times daily with a meal.    . simvastatin (ZOCOR) 40 MG tablet Take 1 tablet (40 mg total) by mouth daily at 6 PM. 90 tablet 3  . metoprolol tartrate (LOPRESSOR) 25 MG tablet Take 1 tablet (25 mg total) by mouth 2 (two) times daily as needed. Heart fluttering 60 tablet 1   No facility-administered medications prior to visit.      Per HPI unless specifically indicated in ROS section below Review of Systems     Objective:    BP 120/68 (BP Location: Left Arm, Patient Position: Sitting, Cuff Size: Normal)   Pulse 65   Temp 98.3 F (36.8 C) (Oral)   Wt 180  lb (81.6 kg)   SpO2 95%   BMI 31.89 kg/m   Wt Readings from Last 3 Encounters:  04/28/17 180 lb (81.6 kg)  10/27/16 182 lb (82.6 kg)  07/01/16 188 lb 12 oz (85.6 kg)    Physical Exam  Constitutional: She appears well-developed and well-nourished. No distress.  HENT:  Head: Normocephalic and atraumatic.  Right Ear: External ear normal.  Left Ear: External ear normal.  Nose: Nose normal.  Mouth/Throat: Oropharynx is clear and moist. No oropharyngeal exudate.  Eyes: Conjunctivae and EOM are normal. Pupils are equal, round, and reactive to light. No scleral icterus.  Neck: Normal range of motion. Neck supple.  Cardiovascular: Normal rate, regular rhythm, normal heart sounds and intact distal pulses.  No murmur heard. Pulmonary/Chest: Effort normal and breath sounds normal. No respiratory distress. She has no wheezes. She has no rales.  Musculoskeletal: She exhibits no edema.  See HPI for foot exam if done  Lymphadenopathy:    She has no cervical adenopathy.  Skin: Skin is warm and dry. No rash noted.  Psychiatric: She has a normal mood and affect.  Nursing note and vitals reviewed.    Diabetic Foot Exam - Simple   Simple Foot Form Diabetic Foot exam was performed with  the following findings:  Yes 04/28/2017  2:18 PM  Visual Inspection No deformities, no ulcerations, no other skin breakdown bilaterally:  Yes Sensation Testing Intact to touch and monofilament testing bilaterally:  Yes Pulse Check Posterior Tibialis and Dorsalis pulse intact bilaterally:  Yes Comments     Results for orders placed or performed in visit on 04/27/17  TSH  Result Value Ref Range   TSH 1.86 0.35 - 4.50 uIU/mL  Hemoglobin A1c  Result Value Ref Range   Hgb A1c MFr Bld 6.2 4.6 - 6.5 %  CBC with Differential/Platelet  Result Value Ref Range   WBC 6.9 4.0 - 10.5 K/uL   RBC 4.14 3.87 - 5.11 Mil/uL   Hemoglobin 12.8 12.0 - 15.0 g/dL   HCT 38.5 36.0 - 46.0 %   MCV 93.1 78.0 - 100.0 fl   MCHC 33.1  30.0 - 36.0 g/dL   RDW 13.2 11.5 - 15.5 %   Platelets 251.0 150.0 - 400.0 K/uL   Neutrophils Relative % 63.0 43.0 - 77.0 %   Lymphocytes Relative 26.3 12.0 - 46.0 %   Monocytes Relative 6.5 3.0 - 12.0 %   Eosinophils Relative 3.0 0.0 - 5.0 %   Basophils Relative 1.2 0.0 - 3.0 %   Neutro Abs 4.4 1.4 - 7.7 K/uL   Lymphs Abs 1.8 0.7 - 4.0 K/uL   Monocytes Absolute 0.5 0.1 - 1.0 K/uL   Eosinophils Absolute 0.2 0.0 - 0.7 K/uL   Basophils Absolute 0.1 0.0 - 0.1 K/uL  Comprehensive metabolic panel  Result Value Ref Range   Sodium 141 135 - 145 mEq/L   Potassium 3.9 3.5 - 5.1 mEq/L   Chloride 104 96 - 112 mEq/L   CO2 30 19 - 32 mEq/L   Glucose, Bld 138 (H) 70 - 99 mg/dL   BUN 13 6 - 23 mg/dL   Creatinine, Ser 0.74 0.40 - 1.20 mg/dL   Total Bilirubin 0.6 0.2 - 1.2 mg/dL   Alkaline Phosphatase 108 39 - 117 U/L   AST 15 0 - 37 U/L   ALT 13 0 - 35 U/L   Total Protein 7.7 6.0 - 8.3 g/dL   Albumin 4.2 3.5 - 5.2 g/dL   Calcium 9.7 8.4 - 10.5 mg/dL   GFR 101.16 >60.00 mL/min      Assessment & Plan:   Problem List Items Addressed This Visit    Atypical ductal hyperplasia of left breast    Declined localized excision. Overdue for rpt mammogram (11/2016)- encouraged she call and schedule this.       Controlled type 2 diabetes mellitus without complication, without long-term current use of insulin ([facility]) - Primary    Chronic, wonderful control. Continue current regimen. prevnar today. Foot exam today. RTC 6 mo CPE and f/u at that time. Encouraged she schedule eye exam as she's due.       Obesity, Class I, BMI 30-34.9    Congratulated on ongoing weight loss. Reviewed healthy diet and lifestyle choices for ongoing weight loss.        Other Visit Diagnoses    Need for vaccination with 13-polyvalent pneumococcal conjugate vaccine       Relevant Orders   Pneumococcal conjugate vaccine 13-valent IM (Completed)       Meds ordered this encounter  Medications  . metoprolol tartrate  (LOPRESSOR) 25 MG tablet    Sig: Take 1 tablet (25 mg total) by mouth 2 (two) times daily as needed. Heart fluttering    Dispense:  60  tablet    Refill:  1   Orders Placed This Encounter  Procedures  . Pneumococcal conjugate vaccine 13-valent IM    Follow up plan: Return in about 6 months (around 10/29/2017), or if symptoms worsen or fail to improve, for annual exam, prior fasting for blood work.  Ria Bush, MD

## 2017-04-28 NOTE — Assessment & Plan Note (Addendum)
Chronic, wonderful control. Continue current regimen. prevnar today. Foot exam today. RTC 6 mo CPE and f/u at that time. Encouraged she schedule eye exam as she's due.

## 2017-04-28 NOTE — Assessment & Plan Note (Signed)
Congratulated on ongoing weight loss. Reviewed healthy diet and lifestyle choices for ongoing weight loss.

## 2017-10-26 ENCOUNTER — Other Ambulatory Visit: Payer: Self-pay | Admitting: Family Medicine

## 2017-10-26 DIAGNOSIS — E119 Type 2 diabetes mellitus without complications: Secondary | ICD-10-CM

## 2017-10-26 DIAGNOSIS — E785 Hyperlipidemia, unspecified: Secondary | ICD-10-CM

## 2017-10-27 ENCOUNTER — Other Ambulatory Visit: Payer: Commercial Managed Care - PPO

## 2017-10-28 ENCOUNTER — Ambulatory Visit (INDEPENDENT_AMBULATORY_CARE_PROVIDER_SITE_OTHER): Payer: Commercial Managed Care - PPO | Admitting: Family Medicine

## 2017-10-28 ENCOUNTER — Encounter: Payer: Self-pay | Admitting: Family Medicine

## 2017-10-28 VITALS — BP 120/66 | HR 63 | Temp 98.2°F | Ht 62.5 in | Wt 188.0 lb

## 2017-10-28 DIAGNOSIS — Z7189 Other specified counseling: Secondary | ICD-10-CM | POA: Diagnosis not present

## 2017-10-28 DIAGNOSIS — Z0001 Encounter for general adult medical examination with abnormal findings: Secondary | ICD-10-CM

## 2017-10-28 DIAGNOSIS — N6092 Unspecified benign mammary dysplasia of left breast: Secondary | ICD-10-CM

## 2017-10-28 DIAGNOSIS — E669 Obesity, unspecified: Secondary | ICD-10-CM

## 2017-10-28 DIAGNOSIS — E119 Type 2 diabetes mellitus without complications: Secondary | ICD-10-CM

## 2017-10-28 DIAGNOSIS — E785 Hyperlipidemia, unspecified: Secondary | ICD-10-CM | POA: Diagnosis not present

## 2017-10-28 DIAGNOSIS — R1900 Intra-abdominal and pelvic swelling, mass and lump, unspecified site: Secondary | ICD-10-CM

## 2017-10-28 NOTE — Patient Instructions (Addendum)
Return Friday morning for fasting labs.  We will refer you to local OBGYN.  Stop by referral coordinators to schedule mammogram.  Let us know when you get flu shot.  If interested, check with pharmacy about new 2 shot shingles series (shingrix).  Advanced directive packet provided today.   Health Maintenance, Female Adopting a healthy lifestyle and getting preventive care can go a long way to promote health and wellness. Talk with your health care provider about what schedule of regular examinations is right for you. This is a good chance for you to check in with your provider about disease prevention and staying healthy. In between checkups, there are plenty of things you can do on your own. Experts have done a lot of research about which lifestyle changes and preventive measures are most likely to keep you healthy. Ask your health care provider for more information. Weight and diet Eat a healthy diet  Be sure to include plenty of vegetables, fruits, low-fat dairy products, and lean protein.  Do not eat a lot of foods high in solid fats, added sugars, or salt.  Get regular exercise. This is one of the most important things you can do for your health. ? Most adults should exercise for at least 150 minutes each week. The exercise should increase your heart rate and make you sweat (moderate-intensity exercise). ? Most adults should also do strengthening exercises at least twice a week. This is in addition to the moderate-intensity exercise.  Maintain a healthy weight  Body mass index (BMI) is a measurement that can be used to identify possible weight problems. It estimates body fat based on height and weight. Your health care provider can help determine your BMI and help you achieve or maintain a healthy weight.  For females 77 years of age and older: ? A BMI below 18.5 is considered underweight. ? A BMI of 18.5 to 24.9 is normal. ? A BMI of 25 to 29.9 is considered overweight. ? A BMI of  30 and above is considered obese.  Watch levels of cholesterol and blood lipids  You should start having your blood tested for lipids and cholesterol at 66 years of age, then have this test every 5 years.  You may need to have your cholesterol levels checked more often if: ? Your lipid or cholesterol levels are high. ? You are older than 66 years of age. ? You are at high risk for heart disease.  Cancer screening Lung Cancer  Lung cancer screening is recommended for adults 30-53 years old who are at high risk for lung cancer because of a history of smoking.  A yearly low-dose CT scan of the lungs is recommended for people who: ? Currently smoke. ? Have quit within the past 15 years. ? Have at least a 30-pack-year history of smoking. A pack year is smoking an average of one pack of cigarettes a day for 1 year.  Yearly screening should continue until it has been 15 years since you quit.  Yearly screening should stop if you develop a health problem that would prevent you from having lung cancer treatment.  Breast Cancer  Practice breast self-awareness. This means understanding how your breasts normally appear and feel.  It also means doing regular breast self-exams. Let your health care provider know about any changes, no matter how small.  If you are in your 20s or 30s, you should have a clinical breast exam (CBE) by a health care provider every 1-3 years as part  of a regular health exam.  If you are 30 or older, have a CBE every year. Also consider having a breast X-ray (mammogram) every year.  If you have a family history of breast cancer, talk to your health care provider about genetic screening.  If you are at high risk for breast cancer, talk to your health care provider about having an MRI and a mammogram every year.  Breast cancer gene (BRCA) assessment is recommended for women who have family members with BRCA-related cancers. BRCA-related cancers  include: ? Breast. ? Ovarian. ? Tubal. ? Peritoneal cancers.  Results of the assessment will determine the need for genetic counseling and BRCA1 and BRCA2 testing.  Cervical Cancer Your health care provider may recommend that you be screened regularly for cancer of the pelvic organs (ovaries, uterus, and vagina). This screening involves a pelvic examination, including checking for microscopic changes to the surface of your cervix (Pap test). You may be encouraged to have this screening done every 3 years, beginning at age 70.  For women ages 17-65, health care providers may recommend pelvic exams and Pap testing every 3 years, or they may recommend the Pap and pelvic exam, combined with testing for human papilloma virus (HPV), every 5 years. Some types of HPV increase your risk of cervical cancer. Testing for HPV may also be done on women of any age with unclear Pap test results.  Other health care providers may not recommend any screening for nonpregnant women who are considered low risk for pelvic cancer and who do not have symptoms. Ask your health care provider if a screening pelvic exam is right for you.  If you have had past treatment for cervical cancer or a condition that could lead to cancer, you need Pap tests and screening for cancer for at least 20 years after your treatment. If Pap tests have been discontinued, your risk factors (such as having a new sexual partner) need to be reassessed to determine if screening should resume. Some women have medical problems that increase the chance of getting cervical cancer. In these cases, your health care provider may recommend more frequent screening and Pap tests.  Colorectal Cancer  This type of cancer can be detected and often prevented.  Routine colorectal cancer screening usually begins at 66 years of age and continues through 66 years of age.  Your health care provider may recommend screening at an earlier age if you have risk factors  for colon cancer.  Your health care provider may also recommend using home test kits to check for hidden blood in the stool.  A small camera at the end of a tube can be used to examine your colon directly (sigmoidoscopy or colonoscopy). This is done to check for the earliest forms of colorectal cancer.  Routine screening usually begins at age 85.  Direct examination of the colon should be repeated every 5-10 years through 66 years of age. However, you may need to be screened more often if early forms of precancerous polyps or small growths are found.  Skin Cancer  Check your skin from head to toe regularly.  Tell your health care provider about any new moles or changes in moles, especially if there is a change in a mole's shape or color.  Also tell your health care provider if you have a mole that is larger than the size of a pencil eraser.  Always use sunscreen. Apply sunscreen liberally and repeatedly throughout the day.  Protect yourself by wearing long  sleeves, pants, a wide-brimmed hat, and sunglasses whenever you are outside.  Heart disease, diabetes, and high blood pressure  High blood pressure causes heart disease and increases the risk of stroke. High blood pressure is more likely to develop in: ? People who have blood pressure in the high end of the normal range (130-139/85-89 mm Hg). ? People who are overweight or obese. ? People who are African American.  If you are 93-79 years of age, have your blood pressure checked every 3-5 years. If you are 85 years of age or older, have your blood pressure checked every year. You should have your blood pressure measured twice-once when you are at a hospital or clinic, and once when you are not at a hospital or clinic. Record the average of the two measurements. To check your blood pressure when you are not at a hospital or clinic, you can use: ? An automated blood pressure machine at a pharmacy. ? A home blood pressure monitor.  If  you are between 74 years and 26 years old, ask your health care provider if you should take aspirin to prevent strokes.  Have regular diabetes screenings. This involves taking a blood sample to check your fasting blood sugar level. ? If you are at a normal weight and have a low risk for diabetes, have this test once every three years after 66 years of age. ? If you are overweight and have a high risk for diabetes, consider being tested at a younger age or more often. Preventing infection Hepatitis B  If you have a higher risk for hepatitis B, you should be screened for this virus. You are considered at high risk for hepatitis B if: ? You were born in a country where hepatitis B is common. Ask your health care provider which countries are considered high risk. ? Your parents were born in a high-risk country, and you have not been immunized against hepatitis B (hepatitis B vaccine). ? You have HIV or AIDS. ? You use needles to inject street drugs. ? You live with someone who has hepatitis B. ? You have had sex with someone who has hepatitis B. ? You get hemodialysis treatment. ? You take certain medicines for conditions, including cancer, organ transplantation, and autoimmune conditions.  Hepatitis C  Blood testing is recommended for: ? Everyone born from [birth year] through 1964. ? Anyone with known risk factors for hepatitis C.  Sexually transmitted infections (STIs)  You should be screened for sexually transmitted infections (STIs) including gonorrhea and chlamydia if: ? You are sexually active and are younger than 66 years of age. ? You are older than 66 years of age and your health care provider tells you that you are at risk for this type of infection. ? Your sexual activity has changed since you were last screened and you are at an increased risk for chlamydia or gonorrhea. Ask your health care provider if you are at risk.  If you do not have HIV, but are at risk, it may be recommended  that you take a prescription medicine daily to prevent HIV infection. This is called pre-exposure prophylaxis (PrEP). You are considered at risk if: ? You are sexually active and do not regularly use condoms or know the HIV status of your partner(s). ? You take drugs by injection. ? You are sexually active with a partner who has HIV.  Talk with your health care provider about whether you are at high risk of being infected with HIV. If  you choose to begin PrEP, you should first be tested for HIV. You should then be tested every 3 months for as long as you are taking PrEP. Pregnancy  If you are premenopausal and you may become pregnant, ask your health care provider about preconception counseling.  If you may become pregnant, take 400 to 800 micrograms (mcg) of folic acid every day.  If you want to prevent pregnancy, talk to your health care provider about birth control (contraception). Osteoporosis and menopause  Osteoporosis is a disease in which the bones lose minerals and strength with aging. This can result in serious bone fractures. Your risk for osteoporosis can be identified using a bone density scan.  If you are 51 years of age or older, or if you are at risk for osteoporosis and fractures, ask your health care provider if you should be screened.  Ask your health care provider whether you should take a calcium or vitamin D supplement to lower your risk for osteoporosis.  Menopause may have certain physical symptoms and risks.  Hormone replacement therapy may reduce some of these symptoms and risks. Talk to your health care provider about whether hormone replacement therapy is right for you. Follow these instructions at home:  Schedule regular health, dental, and eye exams.  Stay current with your immunizations.  Do not use any tobacco products including cigarettes, chewing tobacco, or electronic cigarettes.  If you are pregnant, do not drink alcohol.  If you are  breastfeeding, limit how much and how often you drink alcohol.  Limit alcohol intake to no more than 1 drink per day for nonpregnant women. One drink equals 12 ounces of beer, 5 ounces of wine, or 1 ounces of hard liquor.  Do not use street drugs.  Do not share needles.  Ask your health care provider for help if you need support or information about quitting drugs.  Tell your health care provider if you often feel depressed.  Tell your health care provider if you have ever been abused or do not feel safe at home. This information is not intended to replace advice given to you by your health care provider. Make sure you discuss any questions you have with your health care provider. Document Released: 08/12/2010 Document Revised: 07/05/2015 Document Reviewed: 10/31/2014 Elsevier Interactive Patient Education  [employer].

## 2017-10-28 NOTE — Progress Notes (Signed)
BP 120/66 (BP Location: Left Arm, Patient Position: Sitting, Cuff Size: Large)   Pulse 63   Temp 98.2 F (36.8 C) (Oral)   Ht 5' 2.5" (1.588 m)   Wt 188 lb (85.3 kg)   SpO2 98%   BMI 33.84 kg/m    CC: CPE Subjective:    Patient ID: Brandy Guerrero, female    DOB: 07-03-1950, 67 y.o.   MRN: [id number]  HPI: Brandy Guerrero is a 67 y.o. female presenting on 10/28/2017 for Annual Exam   Preventative: Colon cancer screening - iFOB positive (2014), s/p colonoscopy [name]) with diverticulosis, but no reason for bleed found. Rpt stool cards last year negative. Otherwise rpt colonoscopy 10 yrs. Well woman with [name] - s/p TAH for fibroids, one ovary remains. Latest saw GYN 06/2015. she has not returned.  Abnormal L mammo with cluster of calcifications inner breast 05/2016 - atypical ductal hyperplasia referred for excision (Dr Dalbert Batman) - pt declined excision and was recommended f/u 6 months in office (for 12/2016). She did not f/u. Advised she needed to redo mammogram.  DEXA 2013 WNL.  Flu shot yearly  Pneumovax - 04/2017 Tdap 2013 Shingrix - discussed  Advanced planning - does not have set up. Doesn't know who HCPOA would be. Packet provided today.  Seat belt use discussed.  Sunscreen use discussed. No changing moles on skin.  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  Activity: starting regular walking - 3 mi about 2d/wk  Diet: good water, fruits/vegetables daily   Relevant past medical, surgical, family and social history reviewed and updated as indicated. Interim medical history since our last visit reviewed. Allergies and medications reviewed and updated. Outpatient Medications Prior to Visit  Medication Sig Dispense Refill  . FLUOCINOLONE ACETONIDE SCALP 0.01 %  external oil as directed.    Marland Kitchen ketoconazole (NIZORAL) 2 % shampoo as directed.    . metFORMIN (GLUCOPHAGE) 500 MG tablet Take 1 tablet (500 mg total) by mouth daily with breakfast. 90 tablet 3  . metoprolol tartrate (LOPRESSOR) 25 MG tablet Take 1 tablet (25 mg total) by mouth 2 (two) times daily as needed. Heart fluttering 60 tablet 1  . NON FORMULARY Hair and nail vitamin    . orlistat (ALLI) 60 MG capsule Take 60 mg by mouth 2 (two) times daily with a meal.    . simvastatin (ZOCOR) 40 MG tablet Take 1 tablet (40 mg total) by mouth daily at 6 PM. 90 tablet 3  . aspirin 81 MG tablet Take 81 mg by mouth daily.     No facility-administered medications prior to visit.      Per HPI unless specifically indicated in ROS section below Review of Systems  Constitutional: Negative for activity change, appetite change, chills, fatigue, fever and unexpected weight change.  HENT: Negative for hearing loss.   Eyes: Negative for visual disturbance.  Respiratory: Negative for cough, chest tightness, shortness of breath and wheezing.   Cardiovascular: Negative for chest pain, palpitations and leg swelling.  Gastrointestinal: Negative for abdominal distention, abdominal pain, blood in stool, constipation, diarrhea, nausea and vomiting.  Genitourinary: Negative for difficulty urinating and hematuria.  Musculoskeletal: Negative for arthralgias, myalgias and neck pain.  Skin: Negative for rash.  Neurological: Negative for dizziness, seizures, syncope and headaches.  Hematological: Negative for adenopathy. Does not bruise/bleed easily.  Psychiatric/Behavioral: Negative for dysphoric mood. The patient is not nervous/anxious.        Objective:    BP 120/66 (BP Location: Left Arm, Patient Position: Sitting, Cuff Size: Large)   Pulse 63   Temp 98.2 F (36.8 C) (Oral)   Ht 5' 2.5" (1.588 m)   Wt 188 lb (85.3 kg)   SpO2 98%   BMI 33.84 kg/m   Wt Readings from Last 3 Encounters:  10/28/17 188 lb (85.3  kg)  04/28/17 180 lb (81.6 kg)  10/27/16 182 lb (82.6 kg)    Physical Exam  Constitutional: She is oriented to person, place, and time. She appears well-developed and well-nourished. No distress.  HENT:  Head: Normocephalic and atraumatic.  Right Ear: Hearing, tympanic membrane, external ear and ear canal normal.  Left Ear: Hearing, tympanic membrane, external ear and ear canal normal.  Nose: Nose normal.  Mouth/Throat: Uvula is midline, oropharynx is clear and moist and mucous membranes are normal. No oropharyngeal exudate, posterior oropharyngeal edema or posterior oropharyngeal erythema.  Eyes: Pupils are equal, round, and reactive to light. Conjunctivae and EOM are normal. No scleral icterus.  Neck: Normal range of motion. Neck supple. Carotid bruit is not present. No thyromegaly present.  Cardiovascular: Normal rate, regular rhythm, normal heart sounds and intact distal pulses.  No murmur heard. Pulses:      Radial pulses are 2+ on the right side, and 2+ on the left side.  Pulmonary/Chest: Effort normal and breath sounds normal. No respiratory distress. She has no wheezes. She has no rales.  Pt declines breast exam today  Abdominal: Soft. Bowel sounds are normal. She exhibits no distension and no mass. There is no tenderness. There is no rebound and no guarding.  Musculoskeletal: Normal range of motion. She exhibits no edema.  Lymphadenopathy:    She has no cervical adenopathy.  Neurological: She is alert and oriented to person, place, and time.  CN grossly intact, station and gait intact  Skin: Skin is warm and dry. No rash noted.  Psychiatric: She has a normal mood and affect. Her behavior is normal. Judgment and thought content normal.  Nursing note and vitals reviewed.  Results for orders placed or performed in visit on 04/27/17  TSH  Result Value Ref Range   TSH 1.86 0.35 - 4.50 uIU/mL  Hemoglobin A1c  Result Value Ref Range   Hgb A1c MFr Bld 6.2 4.6 - 6.5 %  CBC with  Differential/Platelet  Result Value Ref Range   WBC 6.9 4.0 - 10.5 K/uL   RBC 4.14 3.87 - 5.11 Mil/uL   Hemoglobin 12.8 12.0 - 15.0 g/dL   HCT 38.5 36.0 - 46.0 %   MCV 93.1 78.0 - 100.0 fl   MCHC 33.1 30.0 - 36.0 g/dL   RDW 13.2 11.5 - 15.5 %   Platelets 251.0 150.0 - 400.0 K/uL   Neutrophils Relative % 63.0 43.0 - 77.0 %   Lymphocytes Relative 26.3 12.0 - 46.0 %   Monocytes Relative 6.5 3.0 - 12.0 %   Eosinophils Relative 3.0 0.0 - 5.0 %   Basophils Relative 1.2 0.0 - 3.0 %   Neutro Abs 4.4 1.4 - 7.7 K/uL   Lymphs Abs 1.8 0.7 - 4.0 K/uL   Monocytes Absolute 0.5 0.1 - 1.0 K/uL   Eosinophils Absolute 0.2 0.0 - 0.7 K/uL   Basophils Absolute 0.1 0.0 - 0.1 K/uL  Comprehensive metabolic panel  Result Value Ref Range  Sodium 141 135 - 145 mEq/L   Potassium 3.9 3.5 - 5.1 mEq/L   Chloride 104 96 - 112 mEq/L   CO2 30 19 - 32 mEq/L   Glucose, Bld 138 (H) 70 - 99 mg/dL   BUN 13 6 - 23 mg/dL   Creatinine, Ser 0.74 0.40 - 1.20 mg/dL   Total Bilirubin 0.6 0.2 - 1.2 mg/dL   Alkaline Phosphatase 108 39 - 117 U/L   AST 15 0 - 37 U/L   ALT 13 0 - 35 U/L   Total Protein 7.7 6.0 - 8.3 g/dL   Albumin 4.2 3.5 - 5.2 g/dL   Calcium 9.7 8.4 - 10.5 mg/dL   GFR 101.16 >60.00 mL/min      Assessment & Plan:   Problem List Items Addressed This Visit    Pelvic mass    Has declined resection. Has not followed up with GYN - agrees to local referral.       Obesity, Class I, BMI 30-34.9    Weight gain noted. Pt motivated for ongoing efforts at weight loss - will restart herbalife supplements      HLD (hyperlipidemia)    She will return fasting for FLP.      Relevant Orders   Lipid panel   Comprehensive metabolic panel   Encounter for routine adult medical exam with abnormal findings - Primary    Preventative protocols reviewed and updated unless pt declined. Discussed healthy diet and lifestyle.       Controlled type 2 diabetes mellitus without complication, without long-term current use  of insulin (HCC)    Chronic, will update A1c when she returns fasting.  Continue metformin.       Relevant Orders   Microalbumin / creatinine urine ratio   Hemoglobin A1c   Atypical ductal hyperplasia of left breast    Saw Dr Dalbert Batman 06/2016 after biopsy showed this, declined excision. Did not return for recommended rpt 6 mo mammogram. Agrees to get this scheduled again today.       Relevant Orders   MM Digital Diagnostic Bilat   US BREAST COMPLETE UNI LEFT INC AXILLA   Advanced care planning/counseling discussion    Advanced planning - does not have set up. Doesn't know who HCPOA would be. Packet provided today.           No orders of the defined types were placed in this encounter.  Orders Placed This Encounter  Procedures  . MM Digital Diagnostic Bilat    Standing Status:   Future    Standing Expiration Date:   12/29/2018    Order Specific Question:   Reason for Exam (SYMPTOM  OR DIAGNOSIS REQUIRED)    Answer:   abnormal L mammo 05/2016    Order Specific Question:   Preferred imaging location?    Answer:   [facility]  . US BREAST COMPLETE UNI LEFT INC AXILLA    Standing Status:   Future    Standing Expiration Date:   12/29/2018    Order Specific Question:   Reason for Exam (SYMPTOM  OR DIAGNOSIS REQUIRED)    Answer:   abnormal L mamm 05/2016    Order Specific Question:   Preferred imaging location?    Answer:   [facility]  . Microalbumin / creatinine urine ratio    Standing Status:   Future    Standing Expiration Date:   10/29/2018  . Lipid panel    Standing Status:   Future    Standing Expiration Date:  10/29/2018  . Comprehensive metabolic panel    Standing Status:   Future    Standing Expiration Date:   10/29/2018  . Hemoglobin A1c    Standing Status:   Future    Standing Expiration Date:   10/29/2018    Follow up plan: Return in about 6 months (around 04/28/2018) for follow up visit.  Ria Bush, MD

## 2017-10-28 NOTE — Assessment & Plan Note (Signed)
Has declined resection. Has not followed up with GYN - agrees to local referral.

## 2017-10-28 NOTE — Assessment & Plan Note (Signed)
Saw Dr Dalbert Batman 06/2016 after biopsy showed this, declined excision. Did not return for recommended rpt 6 mo mammogram. Agrees to get this scheduled again today.

## 2017-10-28 NOTE — Assessment & Plan Note (Signed)
Advanced planning - does not have set up. Doesn't know who HCPOA would be. Packet provided today.

## 2017-10-28 NOTE — Assessment & Plan Note (Signed)
She will return fasting for FLP.

## 2017-10-28 NOTE — Assessment & Plan Note (Signed)
Preventative protocols reviewed and updated unless pt declined. Discussed healthy diet and lifestyle.  

## 2017-10-28 NOTE — Assessment & Plan Note (Signed)
Chronic, will update A1c when she returns fasting.  Continue metformin.

## 2017-10-28 NOTE — Assessment & Plan Note (Signed)
Weight gain noted. Pt motivated for ongoing efforts at weight loss - will restart herbalife supplements

## 2017-10-30 ENCOUNTER — Other Ambulatory Visit (INDEPENDENT_AMBULATORY_CARE_PROVIDER_SITE_OTHER): Payer: Commercial Managed Care - PPO

## 2017-10-30 DIAGNOSIS — E119 Type 2 diabetes mellitus without complications: Secondary | ICD-10-CM

## 2017-10-30 DIAGNOSIS — E785 Hyperlipidemia, unspecified: Secondary | ICD-10-CM | POA: Diagnosis not present

## 2017-10-30 LAB — LIPID PANEL
CHOL/HDL RATIO: 4
Cholesterol: 181 mg/dL (ref 0–200)
HDL: 47.2 mg/dL (ref >39.00)
LDL Cholesterol: 121 mg/dL — ABNORMAL HIGH (ref 0–99)
NonHDL: 134.14
Triglycerides: 68 mg/dL (ref 0.0–149.0)
VLDL: 13.6 mg/dL (ref 0.0–40.0)

## 2017-10-30 LAB — COMPREHENSIVE METABOLIC PANEL
ALK PHOS: 101 U/L (ref 39–117)
ALT: 13 U/L (ref 0–35)
AST: 16 U/L (ref 0–37)
Albumin: 4.1 g/dL (ref 3.5–5.2)
BILIRUBIN TOTAL: 0.7 mg/dL (ref 0.2–1.2)
BUN: 16 mg/dL (ref 6–23)
CO2: 29 meq/L (ref 19–32)
Calcium: 9.4 mg/dL (ref 8.4–10.5)
Chloride: 106 mEq/L (ref 96–112)
Creatinine, Ser: 0.82 mg/dL (ref 0.40–1.20)
GFR: 89.71 mL/min (ref >60.00)
GLUCOSE: 127 mg/dL — AB (ref 70–99)
Potassium: 4.5 mEq/L (ref 3.5–5.1)
SODIUM: 142 meq/L (ref 135–145)
TOTAL PROTEIN: 7.4 g/dL (ref 6.0–8.3)

## 2017-10-30 LAB — MICROALBUMIN / CREATININE URINE RATIO
CREATININE, U: 114 mg/dL
MICROALB/CREAT RATIO: 0.6 mg/g (ref 0.0–30.0)
Microalb, Ur: <0.7 mg/dL (ref 0.0–1.9)

## 2017-10-30 LAB — HEMOGLOBIN A1C: Hgb A1c MFr Bld: 6.4 % (ref 4.6–6.5)

## 2017-11-04 ENCOUNTER — Other Ambulatory Visit: Payer: Self-pay | Admitting: Family Medicine

## 2017-11-04 MED ORDER — ATORVASTATIN CALCIUM 20 MG PO TABS: 20.0000 mg | ORAL_TABLET | Freq: Every day | ORAL | 6 refills | Status: DC

## 2017-11-11 ENCOUNTER — Telehealth: Payer: Self-pay | Admitting: Family Medicine

## 2017-11-11 NOTE — Telephone Encounter (Signed)
Copied from [place] [id number]. Topic: Quick Communication - Rx Refill/Question >> Nov 11, 2017 11:33 AM Reyne Dumas L wrote: Pt states the physician changed the name and dose of her cholesterol medication - pt can't remember what the medication is - and she wants to know why. Pt can be reached at [phone].

## 2017-11-11 NOTE — Telephone Encounter (Signed)
See recent lab results.

## 2017-11-12 NOTE — Telephone Encounter (Signed)
Spoke with pt informing her med was changed to see if it would eliminate muscle pain.  Pt verbalizes understanding and will let Dr. Darnell Level know it that helps.

## 2017-11-13 ENCOUNTER — Telehealth: Payer: Self-pay | Admitting: Family Medicine

## 2017-11-13 NOTE — Telephone Encounter (Signed)
Pt dropped off physical form to be filled out for insurance. Placed in [place] tower.

## 2017-11-13 NOTE — Telephone Encounter (Signed)
Filled and in Lisa's box 

## 2017-11-13 NOTE — Telephone Encounter (Signed)
Placed form in Dr. G's box.  

## 2017-11-16 NOTE — Telephone Encounter (Addendum)
Left message on vm for pt to call back.  Need to notify pt her physical form is ready to pick up.   [Placed form at front office. Made copy to be scanned.]

## 2017-11-25 ENCOUNTER — Ambulatory Visit
Admission: RE | Admit: 2017-11-25 | Discharge: 2017-11-25 | Disposition: A | Discharge status: Home / Self Care | Payer: Commercial Managed Care - PPO | Source: Ambulatory Visit | Attending: Family Medicine | Admitting: Family Medicine

## 2017-11-25 ENCOUNTER — Ambulatory Visit: Payer: Commercial Managed Care - PPO

## 2017-11-25 ENCOUNTER — Other Ambulatory Visit: Payer: Commercial Managed Care - PPO

## 2017-11-25 DIAGNOSIS — N6092 Unspecified benign mammary dysplasia of left breast: Secondary | ICD-10-CM

## 2018-02-16 ENCOUNTER — Ambulatory Visit (INDEPENDENT_AMBULATORY_CARE_PROVIDER_SITE_OTHER): Payer: Commercial Managed Care - PPO | Admitting: Family Medicine

## 2018-02-16 ENCOUNTER — Encounter: Payer: Self-pay | Admitting: Family Medicine

## 2018-02-16 VITALS — BP 118/64 | HR 76 | Temp 98.0°F | Ht 62.5 in | Wt 187.5 lb

## 2018-02-16 DIAGNOSIS — N12 Tubulo-interstitial nephritis, not specified as acute or chronic: Secondary | ICD-10-CM | POA: Diagnosis not present

## 2018-02-16 DIAGNOSIS — R300 Dysuria: Secondary | ICD-10-CM

## 2018-02-16 LAB — POCT URINALYSIS DIPSTICK
Bilirubin, UA: NEGATIVE
Glucose, UA: NEGATIVE
Ketones, UA: NEGATIVE
Nitrite, UA: NEGATIVE
PH UA: 6 (ref 5.0–8.0)
PROTEIN UA: NEGATIVE
RBC UA: NEGATIVE
Spec Grav, UA: <=1.005 — AB (ref 1.010–1.025)
UROBILINOGEN UA: 0.2 U/dL

## 2018-02-16 MED ORDER — CIPROFLOXACIN HCL 500 MG PO TABS: 500.0000 mg | ORAL_TABLET | Freq: Two times a day (BID) | ORAL | 0 refills | Status: AC

## 2018-02-16 NOTE — Patient Instructions (Signed)
You have a urinary infection  It may be in your kidney  Take ciprofloxacin 500 mg twice daily for 7 days.   If fevers, chills, nausea or vomiting -- go to the ER as you may need IV antibiotics

## 2018-02-16 NOTE — Progress Notes (Signed)
   Subjective:     Brandy Guerrero is a 68 y.o. female presenting for Urinary problems (symptoms started on 02/12/2018. Burning with urination, right side back pain, some blood on toilet paper after urination. No fever. No nausea or abdominal pain.)     Dysuria   This is a new problem. The current episode started in the past 7 days. The problem occurs every urination. The problem has been waxing and waning. The quality of the pain is described as burning. The pain is mild. There has been no fever. She is sexually active. There is a history of pyelonephritis. Associated symptoms include flank pain, hematuria and urgency. Pertinent negatives include no frequency, hesitancy, nausea or vomiting. She has tried increased fluids for the symptoms. The treatment provided mild relief. There is no history of kidney stones or recurrent UTIs.     Review of Systems  Gastrointestinal: Negative for nausea and vomiting.  Genitourinary: Positive for dysuria, flank pain, hematuria and urgency. Negative for frequency and hesitancy.     Social History   Tobacco Use  Smoking Status Never Smoker  Smokeless Tobacco Never Used        Objective:    BP Readings from Last 3 Encounters:  02/16/18 118/64  10/28/17 120/66  04/28/17 120/68   Wt Readings from Last 3 Encounters:  02/16/18 187 lb 8 oz (85 kg)  10/28/17 188 lb (85.3 kg)  04/28/17 180 lb (81.6 kg)    BP 118/64   Pulse 76   Temp 98 F (36.7 C)   Ht 5' 2.5" (1.588 m)   Wt 187 lb 8 oz (85 kg)   SpO2 98%   BMI 33.75 kg/m    Physical Exam Constitutional:      General: She is not in acute distress.    Appearance: She is well-developed. She is not diaphoretic.  HENT:     Head: Normocephalic and atraumatic.  Eyes:     Conjunctiva/sclera: Conjunctivae normal.  Neck:     Musculoskeletal: Neck supple.  Cardiovascular:     Rate and Rhythm: Normal rate and regular rhythm.     Heart sounds: Normal heart sounds.  Pulmonary:     Effort:  Pulmonary effort is normal.  Abdominal:     General: Bowel sounds are normal. There is no distension.     Palpations: Abdomen is soft.     Tenderness: There is no abdominal tenderness. There is right CVA tenderness. There is no left CVA tenderness or guarding.  Skin:    General: Skin is warm and dry.  Neurological:     Mental Status: She is alert.    UA: +LE, negative nitrites       Assessment & Plan:   Problem List Items Addressed This Visit    None    Visit Diagnoses    Dysuria    -  Primary   Relevant Orders   POCT urinalysis dipstick (Completed)   Urine Culture   Pyelonephritis of right kidney       Relevant Medications   ciprofloxacin (CIPRO) 500 MG tablet     Given presence of right CVA tenderness and hx of pyelonephritis will treat to cover pyelonephritis with strict ER precautions if worsening symptoms.   Will follow-up culture given only slightly positive UA  Return if symptoms worsen or fail to improve.  Lesleigh Noe, MD

## 2018-02-17 LAB — URINE CULTURE
MICRO NUMBER:: 21887
SPECIMEN QUALITY: ADEQUATE

## 2018-02-18 ENCOUNTER — Telehealth: Payer: Self-pay

## 2018-02-18 NOTE — Telephone Encounter (Signed)
-----   Message from Lesleigh Noe, MD sent at 02/18/2018  7:58 AM EST ----- Urine culture without sign of infection. Would advise stopping antibiotics. Return to clinic if fevers, chills, worsening symptoms. Can try Azo for burning.

## 2018-02-18 NOTE — Telephone Encounter (Signed)
Left message for patient to call back  

## 2018-02-19 NOTE — Telephone Encounter (Addendum)
Agree. Return for eval if not improving with Azo.

## 2018-02-19 NOTE — Telephone Encounter (Signed)
Spoke with patient today. Patient states she felt like burning sensation got worse with taking antibiotic. She will go ahead and stop taking this. She will try Azo OTC over the weekend and will call back on Monday if symptoms are not any better or new symptom develop. She denies any vaginal itching, vaginal odor or abnormal vaginal discharge. No other symptoms besides the continues burning. Patient advised she can follow up with Dr. Einar Pheasant or see her PCP Dr. Danise Mina.

## 2018-08-18 ENCOUNTER — Ambulatory Visit (INDEPENDENT_AMBULATORY_CARE_PROVIDER_SITE_OTHER): Payer: Commercial Managed Care - PPO | Admitting: Family Medicine

## 2018-08-18 ENCOUNTER — Encounter: Payer: Self-pay | Admitting: Family Medicine

## 2018-08-18 ENCOUNTER — Other Ambulatory Visit: Payer: Self-pay

## 2018-08-18 VITALS — BP 126/68 | HR 73 | Temp 97.6°F | Ht 62.5 in | Wt 183.3 lb

## 2018-08-18 DIAGNOSIS — K429 Umbilical hernia without obstruction or gangrene: Secondary | ICD-10-CM

## 2018-08-18 DIAGNOSIS — R300 Dysuria: Secondary | ICD-10-CM | POA: Diagnosis not present

## 2018-08-18 DIAGNOSIS — R1900 Intra-abdominal and pelvic swelling, mass and lump, unspecified site: Secondary | ICD-10-CM

## 2018-08-18 LAB — POC URINALSYSI DIPSTICK (AUTOMATED)
Bilirubin, UA: NEGATIVE
Blood, UA: NEGATIVE
Glucose, UA: NEGATIVE
Ketones, UA: NEGATIVE
Leukocytes, UA: NEGATIVE
Nitrite, UA: NEGATIVE
Protein, UA: NEGATIVE
Spec Grav, UA: 1.01 (ref 1.010–1.025)
Urobilinogen, UA: 0.2 E.U./dL
pH, UA: 6 (ref 5.0–8.0)

## 2018-08-18 MED ORDER — PHENAZOPYRIDINE HCL 100 MG PO TABS: 100.0000 mg | ORAL_TABLET | Freq: Three times a day (TID) | ORAL | 0 refills | Status: DC | PRN

## 2018-08-18 NOTE — Progress Notes (Signed)
This visit was conducted in person.  BP 126/68 (BP Location: Left Arm, Patient Position: Sitting, Cuff Size: Normal)   Pulse 73   Temp 97.6 F (36.4 C) (Tympanic)   Ht 5' 2.5" (1.588 m)   Wt 183 lb 5 oz (83.2 kg)   SpO2 97%   BMI 32.99 kg/m    CC: discuss hernia, dysuria Subjective:    Patient ID: Brandy Guerrero, female    DOB: 1950-10-07, 68 y.o.   MRN: [id number]  HPI: Brandy Guerrero is a 68 y.o. female presenting on 08/18/2018 for Hernia (Here for f/u. Says it protrudes further and is easily irritated. Wants to discuss surgery. ) and Dysuria (C/o burning with urination. Started about 1 wk ago. )   Works at [employer]. Her prior private patient passed away. 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   Noticing increasing pain at site of known umbilical hernia. Job does entail physical activity (pulling, lifting, pushing patients). Interested in surgical evaluation.   Dysuria started about 1 wk ago. No frequency, urgency, hematuria. No fevers/chills, flank pain, abd pain,  No recent abx use.  She has recently increased soda intake.  She continues using herbalife.      Relevant past medical, surgical, family and social history reviewed and updated as indicated. Interim medical history since our last visit reviewed. Allergies and medications reviewed and updated. Outpatient Medications Prior to Visit  Medication Sig Dispense Refill  . aspirin 81 MG tablet Take 81 mg by mouth daily.    Marland Kitchen atorvastatin (LIPITOR) 20 MG tablet Take 1 tablet (20 mg total) by mouth daily. 30 tablet 6  . FLUOCINOLONE ACETONIDE SCALP 0.01 % external oil as directed.    Marland Kitchen ketoconazole (NIZORAL) 2 % shampoo as directed.    . metFORMIN (GLUCOPHAGE) 500 MG tablet Take 1 tablet (500 mg total) by mouth daily with breakfast. 90 tablet 3  . metoprolol tartrate (LOPRESSOR) 25 MG tablet Take 1 tablet (25 mg total) by mouth 2 (two) times daily as needed. Heart fluttering 60 tablet 1  . NON FORMULARY Hair and  nail vitamin    . orlistat (ALLI) 60 MG capsule Take 60 mg by mouth 2 (two) times daily with a meal.    . simvastatin (ZOCOR) 40 MG tablet Take 1 tablet (40 mg total) by mouth daily at 6 PM. 90 tablet 3   No facility-administered medications prior to visit.      Per HPI unless specifically indicated in ROS section below Review of Systems Objective:    BP 126/68 (BP Location: Left Arm, Patient Position: Sitting, Cuff Size: Normal)   Pulse 73   Temp 97.6 F (36.4 C) (Tympanic)   Ht 5' 2.5" (1.588 m)   Wt 183 lb 5 oz (83.2 kg)   SpO2 97%   BMI 32.99 kg/m   Wt Readings from Last 3 Encounters:  08/18/18 183 lb 5 oz (83.2 kg)  02/16/18 187 lb 8 oz (85 kg)  10/28/17 188 lb (85.3 kg)    Physical Exam Vitals signs and nursing note reviewed.  Constitutional:      General: She is not in acute distress.    Appearance: Normal appearance. She is not ill-appearing.  HENT:     Mouth/Throat:     Mouth: Mucous membranes are moist.  Abdominal:     General: Abdomen is flat. Bowel sounds are normal. There is no distension.     Palpations: Abdomen is soft. There is no mass.  Tenderness: There is no abdominal tenderness. There is no right CVA tenderness, left CVA tenderness, guarding or rebound.     Hernia: A hernia is present. Hernia is present in the umbilical area (reducible, mildly tender).  Musculoskeletal:     Right lower leg: No edema.     Left lower leg: No edema.  Neurological:     Mental Status: She is alert.  Psychiatric:        Mood and Affect: Mood normal.       Results for orders placed or performed in visit on 08/18/18  POCT Urinalysis Dipstick (Automated)  Result Value Ref Range   Color, UA light yellow    Clarity, UA clear    Glucose, UA Negative Negative   Bilirubin, UA negative    Ketones, UA negative    Spec Grav, UA 1.010 1.010 - 1.025   Blood, UA negative    pH, UA 6.0 5.0 - 8.0   Protein, UA Negative Negative   Urobilinogen, UA 0.2 0.2 or 1.0 E.U./dL    Nitrite, UA negative    Leukocytes, UA Negative Negative   Assessment & Plan:   Problem List Items Addressed This Visit    Umbilical hernia    Ongoing tenderness, may be enlarging but still reducible. Pt desires discussion about definitive management. Will refer to surgery per pt request.       Relevant Orders   Ambulatory referral to General Surgery   Pelvic mass    Has previously declined resection. Prior saw Dr Phineas Real, would like to establish with local female provider - will refer to [facility].      Relevant Orders   Ambulatory referral to Gynecology   Dysuria - Primary    UA normal. Anticipate symptoms from bladder irritants such as caffeine, sodas, spicy foods. rec limiting these foods, may try pyridium sent to pharmacy. Update if ongoing or new symtpoms develop       Relevant Orders   POCT Urinalysis Dipstick (Automated) (Completed)       Meds ordered this encounter  Medications  . phenazopyridine (PYRIDIUM) 100 MG tablet    Sig: Take 1 tablet (100 mg total) by mouth 3 (three) times daily as needed for pain.    Dispense:  10 tablet    Refill:  0   Orders Placed This Encounter  Procedures  . Ambulatory referral to General Surgery    Referral Priority:   Routine    Referral Type:   Surgical    Referral Reason:   Specialty Services Required    Requested Specialty:   General Surgery    Number of Visits Requested:   1  . Ambulatory referral to Gynecology    Referral Priority:   Routine    Referral Type:   Consultation    Referral Reason:   Specialty Services Required    Requested Specialty:   Gynecology    Number of Visits Requested:   1  . POCT Urinalysis Dipstick (Automated)    Follow up plan: No follow-ups on file.  Ria Bush, MD

## 2018-08-18 NOTE — Patient Instructions (Addendum)
Urine looking ok today - increase water, limit sodas, caffeine, spicy foods. Consider return to GYN.  We will refer you to general surgery for evaluation of umbilical hernia

## 2018-08-18 NOTE — Assessment & Plan Note (Signed)
Has previously declined resection. Prior saw Dr Phineas Real, would like to establish with local female provider - will refer to [facility].

## 2018-08-18 NOTE — Assessment & Plan Note (Addendum)
Ongoing tenderness, may be enlarging but still reducible. Pt desires discussion about definitive management. Will refer to surgery per pt request.

## 2018-08-18 NOTE — Assessment & Plan Note (Signed)
UA normal. Anticipate symptoms from bladder irritants such as caffeine, sodas, spicy foods. rec limiting these foods, may try pyridium sent to pharmacy. Update if ongoing or new symtpoms develop

## 2018-09-10 ENCOUNTER — Ambulatory Visit (INDEPENDENT_AMBULATORY_CARE_PROVIDER_SITE_OTHER): Payer: Commercial Managed Care - PPO | Admitting: Obstetrics & Gynecology

## 2018-09-10 ENCOUNTER — Ambulatory Visit: Payer: Commercial Managed Care - PPO | Admitting: Surgery

## 2018-09-10 ENCOUNTER — Ambulatory Visit (INDEPENDENT_AMBULATORY_CARE_PROVIDER_SITE_OTHER): Payer: Commercial Managed Care - PPO

## 2018-09-10 ENCOUNTER — Other Ambulatory Visit: Payer: Self-pay

## 2018-09-10 ENCOUNTER — Encounter: Payer: Self-pay | Admitting: Obstetrics & Gynecology

## 2018-09-10 VITALS — BP 120/62 | HR 87 | Ht 63.0 in | Wt 187.0 lb

## 2018-09-10 DIAGNOSIS — R1900 Intra-abdominal and pelvic swelling, mass and lump, unspecified site: Secondary | ICD-10-CM

## 2018-09-10 NOTE — Progress Notes (Signed)
Consultant: Dr Biagio Borg Consulting Reason: Pelvic mass  HPI: Patient is a 68 y.o. G2P2, patient has had a hysterectomy 1990s for fibroids, presents today for a problem visit.  She complains of past history of at least 9 years of Left adnexal mass by Ultrasound - Pelvic Vaginal, CT - Pelvis.  Last imaging study 2013. Was followed by GYN Dr Phineas Real, and she resisted surgery in past.  She has seen Gyn Onc one time in past but I have no info or notes from that visit.  Pt has had symptoms of none.  Denies pain, bloating, pressure, change in weight, hot flashes, urinary concerns.  She does have umb hernia at this time w pain there.  Prior TAH and RSO at that same surgery  PMHx: She  has a past medical history of Acne, Anemia, History of atrial fibrillation (05/2005), HLD (hyperlipidemia), Obesity, Panic attacks, Pelvic mass, Positive occult stool blood test (2014), and Prediabetes. Also,  has a past surgical history that includes Abdominal hysterectomy (1990s); Colonoscopy (01/2012); and Breast biopsy., family history includes Alcohol abuse in her brother and father; Diabetes in her daughter; Hypertension in her mother.,  reports that she has never smoked. She has never used smokeless tobacco. She reports current alcohol use. She reports that she does not use drugs.  She has a current medication list which includes the following prescription(s): atorvastatin, fluocinolone acetonide scalp, ketoconazole, metformin, metoprolol tartrate, NON FORMULARY, orlistat, simvastatin, aspirin, and phenazopyridine. Also, is allergic to sulfa antibiotics.  Review of Systems  Constitutional: Negative for chills, fever and malaise/fatigue.  HENT: Negative for congestion, sinus pain and sore throat.   Eyes: Negative for blurred vision and pain.  Respiratory: Negative for cough and wheezing.   Cardiovascular: Negative for chest pain and leg swelling.  Gastrointestinal: Negative for abdominal pain, constipation,  diarrhea, heartburn, nausea and vomiting.  Genitourinary: Negative for dysuria, frequency, hematuria and urgency.  Musculoskeletal: Negative for back pain, joint pain, myalgias and neck pain.  Skin: Negative for itching and rash.  Neurological: Negative for dizziness, tremors and weakness.  Endo/Heme/Allergies: Does not bruise/bleed easily.  Psychiatric/Behavioral: Negative for depression. The patient is not nervous/anxious and does not have insomnia.     Objective: BP 120/62 (BP Location: Left Arm, Patient Position: Sitting, Cuff Size: Normal)   Pulse 87   Ht 5\' 3"  (1.6 m)   Wt 187 lb (84.8 kg)   BMI 33.13 kg/m  Physical Exam Constitutional:      General: She is not in acute distress.    Appearance: She is well-developed.  Genitourinary:     Pelvic exam was performed with patient supine.     Vulva, inguinal canal, urethra, bladder and vagina normal.     No vaginal erythema or bleeding.     Cervix is absent.     Uterus is absent.     Left adnexal mass present.     Left adnexa mobile.     Left adnexa not tender.     Genitourinary Comments: Cuff intact/ no lesions Absent uterus and cervix Large left to midline adnexal NT mass  HENT:     Head: Normocephalic and atraumatic.     Nose: Nose normal.  Abdominal:     General: There is no distension.     Palpations: Abdomen is soft.     Tenderness: There is no abdominal tenderness.  Musculoskeletal: Normal range of motion.  Neurological:     Mental Status: She is alert and oriented to person, place, and time.  Cranial Nerves: No cranial nerve deficit.  Skin:    General: Skin is warm and dry.   2013 CT finding: A bilobed pelvic mass is present.  The left upper lobulation of this mass measures 6.0 x 4.8 by 4.1 cm and the lower portion of the mass, which is central within the anatomic pelvis, measures 11.4 x 8.9 by 9.6 cm.  This mass appears solid but has  Mildly internally heterogeneous elements  Right adnexal structure  which could represent another extension of this mass or the right ovary measures 2.6 x 2.7 cm on image 60 of series 2.  The left external iliac node has a short axis of 0.8 cm on image 63 of series 2.  2011 [place] finding: 12 cm solid appearing mass, neg doppler flow, no FF, no cystic component  ASSESSMENT/PLAN:    Problem List Items Addressed This Visit      Other   Pelvic mass in female - Primary   Relevant Orders   CA 125   US PELVIC COMPLETE WITH TRANSVAGINAL    Compare imaging today to prior, assess progression    Will call after results return (labs and [place]). Pt is asymptomatic but still has need for removal based on size, cancer potential.  It has been present for at least 9 years, cancer potential low at this point. Surgery would be min-laparotomy with risk for full laparotomy based on size and adhesions.  Barnett Applebaum, MD, [facility], [facility] 09/10/2018  10:11 AM

## 2018-09-10 NOTE — Patient Instructions (Signed)
Pelvic Mass, Female  A pelvic mass is an abnormal growth in the pelvis. The pelvis is the area between your hip bones. It includes the bladder, rectum, uterus, and ovaries. A pelvic mass may be found during a routine pelvic exam or while performing an MRI, CT scan, or ultrasound for other problems of the abdomen. What are common types of pelvic masses? Pelvic masses include:  Ovarian cysts. These are fluid-filled sacs that form on an ovary.  Tumors. These may be cancerous (malignant) or noncancerous (benign). Noncancerous tumors in the uterus are called uterine fibroids.  Ectopic pregnancy. This is when the fertilized egg attaches (implants) outside the uterus.  Infections. What type of testing may be needed? Your health care provider may recommend that you have tests to diagnose the cause of the pelvic mass. The following tests may be done if a pelvic mass is found:  Physical exam.  Blood tests.  X-rays.  Ultrasound.  CT scan.  MRI.  A surgery to look inside your abdomen with cameras (laparoscopy).  A biopsy that is performed with a needle or during laparoscopy or surgery. In some cases, what seemed like a pelvic mass may actually be something else, such as a mass in one of the organs that is near the pelvis, an infection (abscess), or scar tissue (adhesions) that formed after a surgery. Tests and physical exams may be done once, or they may be done regularly for a period of time. Tests and exams that are done regularly will help monitor whether the mass or tissue change is growing and becoming a concern. What are common treatments? Treatment is not always needed for this condition. Your health care provider may recommend careful monitoring (watchful waiting) and regular tests and exams. Treatment will depend on the cause of the mass. Follow these instructions at home:  What you need to do at home will depend on the cause of the mass. Follow the instructions that your health  care provider gives to you. In general: ? Keep all follow-up visits as directed by your health care provider. This is important. ? Take over-the-counter and prescription medicines only as directed by your health care provider. ? If you were prescribed an antibiotic medicine, take it as told by your health care provider. Do not stop taking the antibiotic even if you start to feel better. ? Follow any restrictions that are given to you by your health care provider.  Try to stay calm, and be sure to ask questions. Make sure you understand the recommendations for monitoring and whether there is a reason for concern. Contact a health care provider if you:  Develop new symptoms.  Note changes in the size, shape, or position of your mass.  Are unable to have a bowel movement.  Bruise or bleed easily. Get help right away if you:  Vomit bright red blood or vomit material that looks like coffee grounds.  Have blood in your stools, or the stools turn black and tarry.  Have an abnormal or increased amount of vaginal bleeding.  Have a fever or chills.  Develop sudden or worsening pain that is not relieved by medicine.  Feel dizzy or weak.  Feel light-headed or you faint.  Feel that the mass has suddenly gotten larger.  Develop severe bloating in your abdomen or your pelvis.  Cannot pass any urine. Summary  A pelvic mass is an abnormal growth in the pelvis. The pelvis is the area between your hip bones. It includes the bladder, rectum,  uterus, and ovaries.  Pelvic masses include ovarian cysts, tumors, ectopic pregnancy, or infections.  Your health care provider may recommend that you have tests to diagnose the cause of the pelvic mass.  Treatment will depend on the cause of the mass. This information is not intended to replace advice given to you by your health care provider. Make sure you discuss any questions you have with your health care provider. Document Released: 05/06/2006  Document Revised: 02/18/2017 Document Reviewed: 02/18/2017 Elsevier Patient Education  2020 [employer].

## 2018-09-11 LAB — CA 125: Cancer Antigen (CA) 125: 3.9 U/mL (ref 0.0–38.1)

## 2018-09-13 ENCOUNTER — Other Ambulatory Visit: Payer: Self-pay | Admitting: Obstetrics & Gynecology

## 2018-09-13 NOTE — Progress Notes (Signed)
Review of ULTRASOUND.    I have personally reviewed images and report of recent ultrasound done at [facility]. Also, CA125 reviewed (normal).   Plan of management discussed with patient.  Mass- recommend it be removed as it can cause future concerns and cannot rule out cancer potential.   Gyn Onc consultation recommended but refused by patient.  She has been seen by prior gyne and gyn onc in [place] in past (rec- surgery and removal of mass).  Pt will call back if desires surgery in the future.  She reports no sx's and no desire for surgery at this time.  Barnett Applebaum, MD, [facility], [facility] 09/13/2018  1:39 PM

## 2018-09-22 ENCOUNTER — Other Ambulatory Visit: Payer: Self-pay

## 2018-09-22 ENCOUNTER — Encounter: Payer: Self-pay | Admitting: Surgery

## 2018-09-22 ENCOUNTER — Ambulatory Visit: Payer: Commercial Managed Care - PPO | Admitting: Surgery

## 2018-09-22 VITALS — BP 148/72 | HR 77 | Temp 97.5°F | Ht 63.0 in | Wt 187.2 lb

## 2018-09-22 DIAGNOSIS — R1900 Intra-abdominal and pelvic swelling, mass and lump, unspecified site: Secondary | ICD-10-CM

## 2018-09-22 DIAGNOSIS — K429 Umbilical hernia without obstruction or gangrene: Secondary | ICD-10-CM | POA: Diagnosis not present

## 2018-09-22 DIAGNOSIS — K439 Ventral hernia without obstruction or gangrene: Secondary | ICD-10-CM

## 2018-09-22 NOTE — Progress Notes (Signed)
09/22/2018  Reason for Visit:  Umbilical hernia  Referring Provider:  Ria Bush, MD  History of Present Illness: Brandy Guerrero is a 68 y.o. female presenting for evaluation of an enlarging umbilical hernia.  She has had this for many years now and from personal review of her imaging records I can see she had it back in 2013 on a CT scan of abdomen/pelvis, which shows a 4.8 cm hernia defect containing part of transverse colon.  She also has a history of a pelvic mass that has also been present for many years and was also shown in the same CT scan from 2013.  She apparently has seen Gyn in [place] and she just recently saw Dr. Kenton Kingfisher in [place], all which have recommended excision, but the patient has declined.  She tells me today that she knows she does "not have cancer".  I did tell her that we cannot be sure as there has been no biopsy or excision, but she again clarifies that she "knows" it's not cancer.  She denies any low pelvic pain.  She reports she had a partial hysterectomy for fibroids in the past with [name], though her his notes, she had a total abdominal hysterectomy.  From the hernia standpoint, she reports she has had this for many years and she does feel that it has grown in size.  She reports that the hernia bulges with any activity and does reduce somewhat when lying flat.  She does not manipulate it much.  She gets pain at the hernia site and tightness.  Denies any nausea or vomiting.  Does report constipation and has a bowel movement every other day, but that is how her bowel habits have been for many many years.    Denies any fevers, chills, chest pain, shortness of breath.  Denies any smoking.  Denies any weight loss.  Past Medical History: Past Medical History:  Diagnosis Date  . Acne    dermatology - on doxy 100mg  daily  . Anemia   . History of atrial fibrillation 05/2005   h/o parox Afib, nl cardiolite/echo, no prolonged episodes.  metoprolol prn  [facility])  . HLD (hyperlipidemia)   . Obesity   . Panic attacks   . Pelvic mass    declines resection  . Positive occult stool blood test 2014   s/p normal colonoscopy, rpt stool cards negative  . Prediabetes      Past Surgical History: Past Surgical History:  Procedure Laterality Date  . ABDOMINAL HYSTERECTOMY  1990s   for fibroids, has one ovary remaining ([place]). partial  . BREAST BIOPSY    . COLONOSCOPY  01/2012   mod diverticulosis, rec rpt stool cards and if + rec EGD (returned negative), rpt colonoscopy 10 yrs    Home Medications: Prior to Admission medications   Medication Sig Start Date End Date Taking? Authorizing Provider  aspirin 81 MG tablet Take 81 mg by mouth daily.    [provider]  atorvastatin (LIPITOR) 20 MG tablet Take 1 tablet (20 mg total) by mouth daily. 11/04/17   Ria Bush, MD  FLUOCINOLONE ACETONIDE SCALP 0.01 % external oil as directed. 10/18/11   [provider]  ketoconazole (NIZORAL) 2 % shampoo as directed. 10/18/11   [provider]  metFORMIN (GLUCOPHAGE) 500 MG tablet Take 1 tablet (500 mg total) by mouth daily with breakfast. 10/27/16   Ria Bush, MD  metoprolol tartrate (LOPRESSOR) 25 MG tablet Take 1 tablet (25 mg total) by mouth 2 (two)  times daily as needed. Heart fluttering 04/28/17 10/04/19  Ria Bush, MD  NON FORMULARY Hair and nail vitamin    [provider]  orlistat (ALLI) 60 MG capsule Take 60 mg by mouth 2 (two) times daily with a meal.    [provider]  phenazopyridine (PYRIDIUM) 100 MG tablet Take 1 tablet (100 mg total) by mouth 3 (three) times daily as needed for pain. Patient not taking: Reported on 09/10/2018 08/18/18   Ria Bush, MD  simvastatin (ZOCOR) 40 MG tablet Take 1 tablet (40 mg total) by mouth daily at 6 PM. 10/27/16   Ria Bush, MD    Allergies: Allergies  Allergen Reactions  . Sulfa Antibiotics Rash    Social History:  reports that  she has never smoked. She has never used smokeless tobacco. She reports current alcohol use. She reports that she does not use drugs.   Family History: Family History  Problem Relation Age of Onset  . Hypertension Mother   . Alcohol abuse Brother   . Alcohol abuse Father   . Diabetes Daughter   . Cancer Neg Hx   . Coronary artery disease Neg Hx   . Stroke Neg Hx     Review of Systems: Review of Systems  Constitutional: Negative for chills and fever.  Respiratory: Negative for shortness of breath.   Cardiovascular: Negative for chest pain.  Gastrointestinal: Positive for abdominal pain (at the hernia site) and constipation. Negative for diarrhea, nausea and vomiting.  Genitourinary: Negative for dysuria.  Musculoskeletal: Negative for myalgias.  Skin: Negative for rash.  Neurological: Negative for dizziness.  Psychiatric/Behavioral: Negative for depression.    Physical Exam BP (!) 148/72   Pulse 77   Temp (!) 97.5 F (36.4 C) (Temporal)   Ht 5\' 3"  (1.6 m)   Wt 187 lb 3.2 oz (84.9 kg)   SpO2 95%   BMI 33.16 kg/m  CONSTITUTIONAL: No acute distress HEENT:  Normocephalic, atraumatic, extraocular motion intact. NECK: Trachea is midline, and there is no jugular venous distension.  RESPIRATORY:  Lungs are clear, and breath sounds are equal bilaterally. Normal respiratory effort without pathologic use of accessory muscles. CARDIOVASCULAR: Heart is regular without murmurs, gallops, or rubs. GI: The abdomen is soft, non-distended, with mild discomfort to palpation in the umbilical area.  The patient has a moderate size umbilical hernia, measuring about 7-8 cm size, with some degree of loss of domain.  The hernia is mostly reducible except at the most superior edge.  MUSCULOSKELETAL:  Normal muscle strength and tone in all four extremities.  No peripheral edema or cyanosis. SKIN: Skin turgor is normal. There are no pathologic skin lesions.  NEUROLOGIC:  Motor and sensation is grossly  normal.  Cranial nerves are grossly intact. PSYCH:  Alert and oriented to person, place and time. Affect is normal.  Laboratory Analysis: No recent labs except CA-125 which is 3.9 (normal).  Imaging: Pelvic U/S 09/13/18: Impression: 1. History of a hysterectomy.  2. Normal appearing right ovary. 3. There is a solid, heterogeneous mass with blood flow in the midline/left pelvis. This mass was previously seen on 12/11/2004 and has grown since this time.    CT scan abd/pelvis 05/28/11: IMPRESSION: 1.  Large heterogeneous mass in the pelvis, with and left upper lobulation, as well as a right ovarian tissue versus mass extension. Resection is recommended.  No ascites or definite omental nodularity. 2.  Umbilical hernia contains adipose tissue and a segment of the transverse colon.  Assessment and Plan: This  is a 68 y.o. female with a moderate size umbilical hernia as well as a large pelvic mass.  Discussed with the patient again that this mass could be cancer until proven otherwise and that we would not know until the mass is removed.    The patient would like to only deal with one surgery at a time and since the hernia is causing her symptoms, she would like to have the hernia repair first.  The biggest concern from my standpoint is the size of the mass, the adhesions it would have around the pelvis, and the size of her hernia.  Discussed with her that the hernia would not be a simple outpatient procedure type to fix, and that based on its size and loss of domain, I think she would need a more complex abdominal hernia repair.  With that, she would need mesh placement and if then she hs surgery later for the mass, all our efforts at repair are gone as the incision for her mass would go through the mesh and possibly lead to another hernia again.  However, the mass itself is also worrisome because this could be a slow growing cancer.  If a combined surgery is approached, my concern would be  possible contamination of the surgical field, possible seeding if this ends being cancer, and then more issues with wound healing, inability to use mesh, recurrence of the hernia, etc.  At this point, I think it would be useful to obtain a repeat CT scan of abdomen and pelvis.  This would help [place] further delineate the true size of the hernia, any loss of domain, the size of the mass, and any other potential issues.  She would like to schedule the CT scan towards the end of August that she has a full day off.  I informed the patient that I would not be available the last week of August or first week of September.  She is ok with that and she is ok with following up with me after Labor Day.  We will get this scheduled.  I think if she truly wants to have a one surgery only, then it may be worthwhile to have her go for second opinion to a tertiary care center.  Face-to-face time spent with the patient and care providers was 60 minutes, with more than 50% of the time spent counseling, educating, and coordinating care of the patient.     Melvyn Neth, [facility]

## 2018-09-22 NOTE — Patient Instructions (Addendum)
The patient is aware to call back for any questions or new concerns.  CT scan [facility] 09-28-18 at 4:00pm Pick up prep kit Nothing to eat or drink 4 hours before scan  Umbilical Hernia, Adult  A hernia is a bulge of tissue that pushes through an opening between muscles. An umbilical hernia happens in the abdomen, near the belly button (umbilicus). The hernia may contain tissues from the small intestine, large intestine, or fatty tissue covering the intestines (omentum). Umbilical hernias in adults tend to get worse over time, and they require surgical treatment. There are several types of umbilical hernias. You may have:  A hernia located just above or below the umbilicus (indirect hernia). This is the most common type of umbilical hernia in adults.  A hernia that forms through an opening formed by the umbilicus (direct hernia).  A hernia that comes and goes (reducible hernia). A reducible hernia may be visible only when you strain, lift something heavy, or cough. This type of hernia can be pushed back into the abdomen (reduced).  A hernia that traps abdominal tissue inside the hernia (incarcerated hernia). This type of hernia cannot be reduced.  A hernia that cuts off blood flow to the tissues inside the hernia (strangulated hernia). The tissues can start to die if this happens. This type of hernia requires emergency treatment. What are the causes? An umbilical hernia happens when tissue inside the abdomen presses on a weak area of the abdominal muscles. What increases the risk? You may have a greater risk of this condition if you:  Are obese.  Have had several pregnancies.  Have a buildup of fluid inside your abdomen (ascites).  Have had surgery that weakens the abdominal muscles. What are the signs or symptoms? The main symptom of this condition is a painless bulge at or near the belly button. A reducible hernia may be visible only when you strain, lift  something heavy, or cough. Other symptoms may include:  Dull pain.  A feeling of pressure. Symptoms of a strangulated hernia may include:  Pain that gets increasingly worse.  Nausea and vomiting.  Pain when pressing on the hernia.  Skin over the hernia becoming red or purple.  Constipation.  Blood in the stool. How is this diagnosed? This condition may be diagnosed based on:  A physical exam. You may be asked to cough or strain while standing. These actions increase the pressure inside your abdomen and force the hernia through the opening in your muscles. Your health care provider may try to reduce the hernia by pressing on it.  Your symptoms and medical history. How is this treated? Surgery is the only treatment for an umbilical hernia. Surgery for a strangulated hernia is done as soon as possible. If you have a small hernia that is not incarcerated, you may need to lose weight before having surgery. Follow these instructions at home:  Lose weight, if told by your health care provider.  Do not try to push the hernia back in.  Watch your hernia for any changes in color or size. Tell your health care provider if any changes occur.  You may need to avoid activities that increase pressure on your hernia.  Do not lift anything that is heavier than 10 lb (4.5 kg) until your health care provider says that this is safe.  Take over-the-counter and prescription medicines only as told by your health care provider.  Keep all follow-up visits as told by your health care provider.  This is important. Contact a health care provider if:  Your hernia gets larger.  Your hernia becomes painful. Get help right away if:  You develop sudden, severe pain near the area of your hernia.  You have pain as well as nausea or vomiting.  You have pain and the skin over your hernia changes color.  You develop a fever. This information is not intended to replace advice given to you by your  health care provider. Make sure you discuss any questions you have with your health care provider. Document Released: 06/29/2015 Document Revised: 03/11/2017 Document Reviewed: 07/28/2016 Elsevier Patient Education  2020 [employer].

## 2018-09-24 ENCOUNTER — Other Ambulatory Visit: Payer: Self-pay

## 2018-09-24 ENCOUNTER — Ambulatory Visit (INDEPENDENT_AMBULATORY_CARE_PROVIDER_SITE_OTHER): Payer: Commercial Managed Care - PPO | Admitting: Family Medicine

## 2018-09-24 ENCOUNTER — Encounter: Payer: Self-pay | Admitting: Family Medicine

## 2018-09-24 VITALS — BP 118/70 | HR 77 | Temp 98.4°F | Ht 62.5 in | Wt 185.1 lb

## 2018-09-24 DIAGNOSIS — R1900 Intra-abdominal and pelvic swelling, mass and lump, unspecified site: Secondary | ICD-10-CM

## 2018-09-24 DIAGNOSIS — G4700 Insomnia, unspecified: Secondary | ICD-10-CM | POA: Insufficient documentation

## 2018-09-24 DIAGNOSIS — K429 Umbilical hernia without obstruction or gangrene: Secondary | ICD-10-CM | POA: Diagnosis not present

## 2018-09-24 DIAGNOSIS — M25511 Pain in right shoulder: Secondary | ICD-10-CM

## 2018-09-24 MED ORDER — METHOCARBAMOL 500 MG PO TABS: 500.0000 mg | ORAL_TABLET | Freq: Three times a day (TID) | ORAL | 0 refills | Status: DC | PRN

## 2018-09-24 NOTE — Patient Instructions (Signed)
Try methocarbamol muscle relaxant for back pain. I think this is rhomboid muscle irritation of the upper back. Try exercises provided today.  Look below for bedtime routine recommendations to help sleep. Let us know if not helping.   Sleep hygiene checklist: 1. Avoid naps during the day 2. Avoid stimulants such as caffeine and nicotine. Avoid bedtime alcohol (it can speed onset of sleep but the body's metabolism can cause awakenings). 3. All forms of exercise help ensure sound sleep - limit vigorous exercise to morning or late afternoon 4. Avoid food too close to bedtime including chocolate (which contains caffeine) 5. Soak up natural light 6. Establish regular bedtime routine. 7. Associate bed with sleep - avoid TV, computer or phone, reading while in bed. 8. Ensure pleasant, relaxing sleep environment - quiet, dark, cool room.

## 2018-09-24 NOTE — Assessment & Plan Note (Signed)
Anticipate third shift work contributing. Sleep hygiene measures reviewed, checklist provided. She is not interested in medication at this time. Will try robaxin and sleep hygiene recommendations and update [place] with effect.

## 2018-09-24 NOTE — Assessment & Plan Note (Signed)
Appreciate gen surg eval. Pt desires to have both surgeries done at once. If unable to do locally, may go to tertiary care center for further evaluation.

## 2018-09-24 NOTE — Progress Notes (Signed)
This visit was conducted in person.  BP 118/70 (BP Location: Left Arm, Patient Position: Sitting, Cuff Size: Normal)   Pulse 77   Temp 98.4 F (36.9 C) (Temporal)   Ht 5' 2.5" (1.588 m)   Wt 185 lb 2 oz (84 kg)   SpO2 98%   BMI 33.32 kg/m    CC: insomnia, R shoulder pain Subjective:    Patient ID: Brandy Guerrero, female    DOB: 02-Nov-1950, 68 y.o.   MRN: [id number]  HPI: Brandy Guerrero is a 68 y.o. female presenting on 09/24/2018 for Insomnia (C/o trouble falling asleep and staying asleep, causing fatigue and irrablility. ) and Shoulder Pain (C/o right shoulder pain. Off and on for about 2 wks. )   R shoulder pain for 2 weeks. Feels stiff. Posterior shoulder pain. Intermittent. Some pain down arm. No neck pain. Occasional headache. 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   Increasing fatigue recently. 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 Trouble getting a good night's rest. Ongoing hot flashes. Bedtime is 5:30pm, wakes up at 10pm to be at work at 11pm, naps during the day. Sleep hygiene measures reviewed.   Seeing GYN and gen surgery for umbilical hernia and longstanding L ovarian mass - pt wants to have both surgeries done at the same time.      Relevant past medical, surgical, family and social history reviewed and updated as indicated. Interim medical history since our last visit reviewed. Allergies and medications reviewed and updated. Outpatient Medications Prior to Visit  Medication Sig Dispense Refill  . aspirin 81 MG tablet Take 81 mg by mouth daily.    Marland Kitchen atorvastatin (LIPITOR) 20 MG tablet Take 1 tablet (20 mg total) by mouth daily. 30 tablet 6  . FLUOCINOLONE ACETONIDE SCALP 0.01 % external oil as directed.    Marland Kitchen ketoconazole (NIZORAL) 2 % shampoo as directed.    . metFORMIN (GLUCOPHAGE) 500 MG tablet Take 1 tablet (500 mg total) by mouth daily with breakfast. 90 tablet 3  . metoprolol tartrate  (LOPRESSOR) 25 MG tablet Take 1 tablet (25 mg total) by mouth 2 (two) times daily as needed. Heart fluttering 60 tablet 1  . NON FORMULARY Hair and nail vitamin    . orlistat (ALLI) 60 MG capsule Take 60 mg by mouth 2 (two) times daily with a meal.    . phenazopyridine (PYRIDIUM) 100 MG tablet Take 1 tablet (100 mg total) by mouth 3 (three) times daily as needed for pain. 10 tablet 0  . simvastatin (ZOCOR) 40 MG tablet Take 1 tablet (40 mg total) by mouth daily at 6 PM. 90 tablet 3   No facility-administered medications prior to visit.      Per HPI unless specifically indicated in ROS section below Review of Systems Objective:    BP 118/70 (BP Location: Left Arm, Patient Position: Sitting, Cuff Size: Normal)   Pulse 77   Temp 98.4 F (36.9 C) (Temporal)   Ht 5' 2.5" (1.588 m)   Wt 185 lb 2 oz (84 kg)   SpO2 98%   BMI 33.32 kg/m   Wt Readings from Last 3 Encounters:  09/24/18 185 lb 2 oz (84 kg)  09/22/18 187 lb 3.2 oz (84.9 kg)  09/10/18 187 lb (84.8 kg)    Physical Exam Vitals signs and nursing note reviewed.  Constitutional:      General: She is not in  acute distress.    Appearance: Normal appearance. She is not ill-appearing.  Musculoskeletal: Normal range of motion.        General: Tenderness present. No swelling or deformity.     Comments: No midline cervical or thoracic spine pain Tender to palpation at R rhomboid muscles L shoulder WNL R shoulder exam: No deformity of shoulders on inspection. Discomfort to palpation of posterior shoulder bursa FROM in abduction and forward flexion. No pain or weakness with testing SITS in ext/int rotation. Discomfort with empty can sign. Neg Speed test. No impingement. No pain with rotation of humeral head in [facility] joint.   Skin:    General: Skin is warm and dry.     Findings: No rash.  Neurological:     Mental Status: She is alert.  Psychiatric:        Mood and Affect: Mood normal.        Behavior: Behavior normal.        Results for orders placed or performed in visit on 09/10/18  CA 125  Result Value Ref Range   Cancer Antigen (CA) 125 3.9 0.0 - 38.1 U/mL   Assessment & Plan:   Problem List Items Addressed This Visit    Umbilical hernia    Appreciate gen surg eval. Pt desires to have both surgeries done at once. If unable to do locally, may go to tertiary care center for further evaluation.       Right shoulder pain - Primary    On exam more consistent with rhomboid strain - pt interested in trial muscle relaxant so robaxin was sent with sedation precautions. Also provided with exercises from [facility] pt advisor on rhomboid strain. Update with effect. Pt requests out of work for next few days to rest shoulder/back.      Pelvic mass in female    Appreciate GYN eval/care. Excision has again been recommended by GYN. Pt desires to have both surgeries done at once. If unable to do locally, may go to tertiary care center for further evaluation.       Insomnia    Anticipate third shift work contributing. Sleep hygiene measures reviewed, checklist provided. She is not interested in medication at this time. Will try robaxin and sleep hygiene recommendations and update [place] with effect.           Meds ordered this encounter  Medications  . methocarbamol (ROBAXIN) 500 MG tablet    Sig: Take 1 tablet (500 mg total) by mouth 3 (three) times daily as needed for muscle spasms (sedation precautions).    Dispense:  40 tablet    Refill:  0   No orders of the defined types were placed in this encounter.   Patient Instructions  Try methocarbamol muscle relaxant for back pain. I think this is rhomboid muscle irritation of the upper back. Try exercises provided today.  Look below for bedtime routine recommendations to help sleep. Let us know if not helping.   Sleep hygiene checklist: 1. Avoid naps during the day 2. Avoid stimulants such as caffeine and nicotine. Avoid bedtime alcohol (it can speed onset of sleep but  the body's metabolism can cause awakenings). 3. All forms of exercise help ensure sound sleep - limit vigorous exercise to morning or late afternoon 4. Avoid food too close to bedtime including chocolate (which contains caffeine) 5. Soak up natural light 6. Establish regular bedtime routine. 7. Associate bed with sleep - avoid TV, computer or phone, reading while in bed. 8. Ensure  pleasant, relaxing sleep environment - quiet, dark, cool room.    Follow up plan: Return if symptoms worsen or fail to improve.  Ria Bush, MD

## 2018-09-24 NOTE — Assessment & Plan Note (Addendum)
Appreciate GYN eval/care. Excision has again been recommended by GYN. Pt desires to have both surgeries done at once. If unable to do locally, may go to tertiary care center for further evaluation.

## 2018-09-24 NOTE — Assessment & Plan Note (Signed)
On exam more consistent with rhomboid strain - pt interested in trial muscle relaxant so robaxin was sent with sedation precautions. Also provided with exercises from [facility] pt advisor on rhomboid strain. Update with effect. Pt requests out of work for next few days to rest shoulder/back.

## 2018-09-28 ENCOUNTER — Ambulatory Visit: Payer: Commercial Managed Care - PPO

## 2018-10-08 ENCOUNTER — Ambulatory Visit
Admission: RE | Admit: 2018-10-08 | Discharge: 2018-10-08 | Disposition: A | Discharge status: Home / Self Care | Payer: Commercial Managed Care - PPO | Source: Ambulatory Visit | Attending: Surgery | Admitting: Surgery

## 2018-10-08 ENCOUNTER — Other Ambulatory Visit: Payer: Self-pay

## 2018-10-08 DIAGNOSIS — K439 Ventral hernia without obstruction or gangrene: Secondary | ICD-10-CM | POA: Insufficient documentation

## 2018-10-08 LAB — POCT I-STAT CREATININE: Creatinine, Ser: 0.8 mg/dL (ref 0.44–1.00)

## 2018-10-08 MED ORDER — IOHEXOL 300 MG/ML  SOLN
100.0000 mL | Freq: Once | INTRAMUSCULAR | Status: AC | PRN
  Administered 2018-10-08: 100 mL via INTRAVENOUS

## 2018-10-19 ENCOUNTER — Ambulatory Visit: Payer: Self-pay | Admitting: Surgery

## 2018-10-26 ENCOUNTER — Ambulatory Visit: Payer: Self-pay | Admitting: Surgery

## 2018-11-03 ENCOUNTER — Ambulatory Visit (INDEPENDENT_AMBULATORY_CARE_PROVIDER_SITE_OTHER): Payer: Commercial Managed Care - PPO | Admitting: Surgery

## 2018-11-03 ENCOUNTER — Other Ambulatory Visit: Payer: Self-pay

## 2018-11-03 ENCOUNTER — Encounter: Payer: Self-pay | Admitting: Surgery

## 2018-11-03 VITALS — BP 116/76 | HR 62 | Temp 97.8°F | Ht 64.0 in | Wt 187.0 lb

## 2018-11-03 DIAGNOSIS — R1900 Intra-abdominal and pelvic swelling, mass and lump, unspecified site: Secondary | ICD-10-CM | POA: Diagnosis not present

## 2018-11-03 DIAGNOSIS — K439 Ventral hernia without obstruction or gangrene: Secondary | ICD-10-CM | POA: Diagnosis not present

## 2018-11-03 NOTE — Progress Notes (Signed)
11/03/2018  History of Present Illness: Brandy Guerrero is a 68 y.o. female presenting for follow up of ventral hernia and pelvic mass.  She was last seen on 8/12 for her first visit.  CT scan was ordered to evaluate the extent of her ventral hernia and any changes to her pelvic mass.  CT scan on [id number] shows an umbilical hernia / ventral hernia with portion of mid transverse colon.  Hernia defect measures about 4.4 cm.  She also has a stable pelvic mass, measuring 10.8 x 10 x 8.9 cm on the larger inferior component and 6 x 3.7 x 5.2 cm on the more superior component.  No other masses noted.  Patient reports that her hernia still has some discomfort when she's doing strenuous activity.  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  Denies any weight loss, fatigue.  Past Medical History: Past Medical History:  Diagnosis Date  . Acne    dermatology - on doxy 100mg  daily  . Anemia   . History of atrial fibrillation 05/2005   h/o parox Afib, nl cardiolite/echo, no prolonged episodes.  metoprolol prn [facility])  . HLD (hyperlipidemia)   . Obesity   . Panic attacks   . Pelvic mass    declines resection  . Positive occult stool blood test 2014   s/p normal colonoscopy, rpt stool cards negative  . Prediabetes      Past Surgical History: Past Surgical History:  Procedure Laterality Date  . ABDOMINAL HYSTERECTOMY  1990s   for fibroids, has one ovary remaining ([place]). partial  . BREAST BIOPSY    . COLONOSCOPY  01/2012   mod diverticulosis, rec rpt stool cards and if + rec EGD (returned negative), rpt colonoscopy 10 yrs    Home Medications: Prior to Admission medications   Medication Sig Start Date End Date Taking? Authorizing Provider  aspirin 81 MG tablet Take 81 mg by mouth daily.   Yes [provider]  atorvastatin (LIPITOR) 20 MG tablet Take 1 tablet (20 mg total) by mouth daily. 11/04/17  Yes Ria Bush, MD  FLUOCINOLONE ACETONIDE SCALP 0.01 %  external oil as directed. 10/18/11  Yes [provider]  ketoconazole (NIZORAL) 2 % shampoo as directed. 10/18/11  Yes [provider]  metFORMIN (GLUCOPHAGE) 500 MG tablet Take 1 tablet (500 mg total) by mouth daily with breakfast. 10/27/16  Yes Ria Bush, MD  methocarbamol (ROBAXIN) 500 MG tablet Take 1 tablet (500 mg total) by mouth 3 (three) times daily as needed for muscle spasms (sedation precautions). 09/24/18  Yes Ria Bush, MD  metoprolol tartrate (LOPRESSOR) 25 MG tablet Take 1 tablet (25 mg total) by mouth 2 (two) times daily as needed. Heart fluttering 04/28/17 10/04/19 Yes Ria Bush, MD  NON FORMULARY Hair and nail vitamin   Yes [provider]  orlistat (ALLI) 60 MG capsule Take 60 mg by mouth 2 (two) times daily with a meal.   Yes [provider]  phenazopyridine (PYRIDIUM) 100 MG tablet Take 1 tablet (100 mg total) by mouth 3 (three) times daily as needed for pain. 08/18/18  Yes Ria Bush, MD  simvastatin (ZOCOR) 40 MG tablet Take 1 tablet (40 mg total) by mouth daily at 6 PM. 10/27/16  Yes Ria Bush, MD    Allergies: Allergies  Allergen Reactions  . Sulfa Antibiotics Rash    Review of Systems: Review of Systems  Constitutional: Negative for chills and fever.  Respiratory: Negative for shortness of  breath.   Cardiovascular: Negative for chest pain.  Gastrointestinal: Positive for abdominal pain (discomfort at the hernia site). Negative for constipation, diarrhea, nausea and vomiting.    Physical Exam BP 116/76   Pulse 62   Temp 97.8 F (36.6 C) (Skin)   Ht 5\' 4"  (1.626 m)   Wt 187 lb (84.8 kg)   SpO2 98%   BMI 32.10 kg/m  CONSTITUTIONAL: No acute distress HEENT:  Normocephalic, atraumatic, extraocular motion intact. RESPIRATORY:  Lungs are clear, and breath sounds are equal bilaterally. Normal respiratory effort without pathologic use of accessory muscles. CARDIOVASCULAR: Heart is regular without  murmurs, gallops, or rubs. GI: The abdomen is soft, obese, non-distended, non-tender to palpation.  There is some discomfort when pressing on her umbilical hernia.  Her hernia is reducible, without any overlying skin ulceration or thinning.  NEUROLOGIC:  Motor and sensation is grossly normal.  Cranial nerves are grossly intact. PSYCH:  Alert and oriented to person, place and time. Affect is normal.  Labs/Imaging: CT scan abdomen/pelvis 10/08/18: IMPRESSION: 1. Large umbilical hernia containing a a loop of transverse colon which contains a single mildly thickened diverticula. Early acute diverticulitis within this location would be difficult to exclude, although there are no surrounding pericolonic inflammatory changes. Additionally, there are no findings to suggest hernia strangulation or obstruction. 2. Similar size and appearance of large bilobed pelvic mass. Continued gynecologic follow-up is recommended.   Assessment and Plan: This is a 68 y.o. female with an umbilical/ventral hernia and pelvic mass  Discussed with her that both her hernia and pelvic mass are stable in size.  Discussed that potentially we can repair her umbilical hernia via laparoscopy, closing the hernia defect and placing mesh to help reinforce the repair.  However, ideally this would be done in the setting of a combined procedure with gynecology due to the large pelvic mass which though stable, in theory cannot rule out malignancy until removed.  She reports that she would like to do the hernia repair first since that's what is bothering her and not the mass.  My concern would be that given the size of the mass, she may need a bigger incision, possibly midline vertical that then would extend into our hernia repair site and compromise the repair, which could then result in hernia recurrence or other possible complication.  If done at the same time, we can adjust the repair based on the incision that is needed for the  pelvic mass and still repair the hernia in a way that would bring good results.    The patient is concerned that she would have a much longer recovery time if both surgeries are done together.  I do not think that it would add any significant time, and if we're able to repair her hernia via laparoscopy, may not be too significant addition of post-op pain and mobility issues.  Discussed with her that whether both are done at the same time or in different settings, the post-op restriction of no heavy lifting or pushing of no more than 10-15 lbs for a period of 4-6 weeks would be the same.  Thus, I do not think we would be creating any significant additional inconvenience.    The patient will think about it and call us with her decision.  Will inform Dr. Kenton Kingfisher and set up appointment with him if she decides to proceed with both surgeries.  Face-to-face time spent with the patient and care providers was 25 minutes, with more than 50% of  the time spent counseling, educating, and coordinating care of the patient.     Melvyn Neth, [facility]

## 2018-11-03 NOTE — Patient Instructions (Signed)
Patient to call us when she is ready to scheduled . She will need a ref to Dr.Rorbert  [facility] [address]

## 2018-11-05 ENCOUNTER — Telehealth: Payer: Self-pay | Admitting: Family Medicine

## 2018-11-05 NOTE — Telephone Encounter (Signed)
Error

## 2018-11-16 ENCOUNTER — Telehealth: Payer: Self-pay | Admitting: Family Medicine

## 2018-11-16 NOTE — Telephone Encounter (Signed)
Patient needs a tb test for a new job.  Patient scheduled nurse visit on 12/01/18. Please order tb test.

## 2018-11-16 NOTE — Telephone Encounter (Signed)
Noted.  TB skin test will be entered at [facility].

## 2018-11-17 ENCOUNTER — Encounter: Payer: Self-pay | Admitting: Gynecology

## 2018-11-30 ENCOUNTER — Other Ambulatory Visit: Payer: Self-pay | Admitting: Family Medicine

## 2018-11-30 DIAGNOSIS — N6092 Unspecified benign mammary dysplasia of left breast: Secondary | ICD-10-CM

## 2018-12-01 ENCOUNTER — Ambulatory Visit (INDEPENDENT_AMBULATORY_CARE_PROVIDER_SITE_OTHER): Payer: Commercial Managed Care - PPO

## 2018-12-01 DIAGNOSIS — Z111 Encounter for screening for respiratory tuberculosis: Secondary | ICD-10-CM | POA: Diagnosis not present

## 2018-12-01 NOTE — Progress Notes (Signed)
Patient received PPD placement today that she needed to start a new job. Patient tolerated well. Patient was advised to come back on 12/03/2018 at 3:45 pm or after but before 5 pm.

## 2018-12-09 ENCOUNTER — Other Ambulatory Visit: Payer: Self-pay

## 2018-12-09 ENCOUNTER — Ambulatory Visit
Admission: RE | Admit: 2018-12-09 | Discharge: 2018-12-09 | Disposition: A | Discharge status: Home / Self Care | Payer: Commercial Managed Care - PPO | Source: Ambulatory Visit | Attending: Family Medicine | Admitting: Family Medicine

## 2018-12-09 DIAGNOSIS — N6092 Unspecified benign mammary dysplasia of left breast: Secondary | ICD-10-CM

## 2018-12-09 LAB — HM MAMMOGRAPHY

## 2018-12-15 ENCOUNTER — Encounter: Payer: Self-pay | Admitting: Family Medicine

## 2018-12-22 ENCOUNTER — Other Ambulatory Visit: Payer: Self-pay

## 2018-12-22 ENCOUNTER — Ambulatory Visit (INDEPENDENT_AMBULATORY_CARE_PROVIDER_SITE_OTHER): Payer: Commercial Managed Care - PPO

## 2018-12-22 DIAGNOSIS — Z111 Encounter for screening for respiratory tuberculosis: Secondary | ICD-10-CM | POA: Diagnosis not present

## 2018-12-22 NOTE — Progress Notes (Signed)
Per orders of Dr. Ria Bush, for TB Skin test given by Lurlean Nanny. Left forearm at 9:03am

## 2018-12-24 LAB — TB SKIN TEST
Induration: 0 mm
TB Skin Test: NEGATIVE

## 2018-12-27 ENCOUNTER — Encounter: Payer: Self-pay | Admitting: Family Medicine

## 2018-12-27 ENCOUNTER — Ambulatory Visit (INDEPENDENT_AMBULATORY_CARE_PROVIDER_SITE_OTHER): Payer: Commercial Managed Care - PPO | Admitting: Family Medicine

## 2018-12-27 ENCOUNTER — Other Ambulatory Visit: Payer: Self-pay

## 2018-12-27 VITALS — BP 130/70 | HR 68 | Temp 98.2°F | Ht 62.25 in | Wt 189.5 lb

## 2018-12-27 DIAGNOSIS — E785 Hyperlipidemia, unspecified: Secondary | ICD-10-CM

## 2018-12-27 DIAGNOSIS — R1900 Intra-abdominal and pelvic swelling, mass and lump, unspecified site: Secondary | ICD-10-CM

## 2018-12-27 DIAGNOSIS — Z23 Encounter for immunization: Secondary | ICD-10-CM

## 2018-12-27 DIAGNOSIS — E119 Type 2 diabetes mellitus without complications: Secondary | ICD-10-CM | POA: Diagnosis not present

## 2018-12-27 DIAGNOSIS — E1169 Type 2 diabetes mellitus with other specified complication: Secondary | ICD-10-CM

## 2018-12-27 DIAGNOSIS — E669 Obesity, unspecified: Secondary | ICD-10-CM

## 2018-12-27 DIAGNOSIS — N6092 Unspecified benign mammary dysplasia of left breast: Secondary | ICD-10-CM

## 2018-12-27 DIAGNOSIS — Z7189 Other specified counseling: Secondary | ICD-10-CM | POA: Diagnosis not present

## 2018-12-27 DIAGNOSIS — K429 Umbilical hernia without obstruction or gangrene: Secondary | ICD-10-CM

## 2018-12-27 DIAGNOSIS — Z0001 Encounter for general adult medical examination with abnormal findings: Secondary | ICD-10-CM | POA: Diagnosis not present

## 2018-12-27 DIAGNOSIS — R002 Palpitations: Secondary | ICD-10-CM

## 2018-12-27 LAB — LIPID PANEL
Cholesterol: 192 mg/dL (ref 0–200)
HDL: 55 mg/dL (ref >39.00)
LDL Cholesterol: 119 mg/dL — ABNORMAL HIGH (ref 0–99)
NonHDL: 137.22
Total CHOL/HDL Ratio: 3
Triglycerides: 91 mg/dL (ref 0.0–149.0)
VLDL: 18.2 mg/dL (ref 0.0–40.0)

## 2018-12-27 LAB — COMPREHENSIVE METABOLIC PANEL
ALT: 12 U/L (ref 0–35)
AST: 13 U/L (ref 0–37)
Albumin: 4.3 g/dL (ref 3.5–5.2)
Alkaline Phosphatase: 112 U/L (ref 39–117)
BUN: 15 mg/dL (ref 6–23)
CO2: 30 mEq/L (ref 19–32)
Calcium: 9.4 mg/dL (ref 8.4–10.5)
Chloride: 103 mEq/L (ref 96–112)
Creatinine, Ser: 0.74 mg/dL (ref 0.40–1.20)
GFR: 94.69 mL/min (ref >60.00)
Glucose, Bld: 140 mg/dL — ABNORMAL HIGH (ref 70–99)
Potassium: 3.9 mEq/L (ref 3.5–5.1)
Sodium: 139 mEq/L (ref 135–145)
Total Bilirubin: 0.7 mg/dL (ref 0.2–1.2)
Total Protein: 7.5 g/dL (ref 6.0–8.3)

## 2018-12-27 LAB — TSH: TSH: 1.31 u[IU]/mL (ref 0.35–4.50)

## 2018-12-27 LAB — MICROALBUMIN / CREATININE URINE RATIO
Creatinine,U: 144.4 mg/dL
Microalb Creat Ratio: 0.5 mg/g (ref 0.0–30.0)
Microalb, Ur: 0.7 mg/dL (ref 0.0–1.9)

## 2018-12-27 LAB — HEMOGLOBIN A1C: Hgb A1c MFr Bld: 6.5 % (ref 4.6–6.5)

## 2018-12-27 MED ORDER — METOPROLOL TARTRATE 25 MG PO TABS: 25.0000 mg | ORAL_TABLET | Freq: Two times a day (BID) | ORAL | 3 refills | Status: DC | PRN

## 2018-12-27 MED ORDER — ATORVASTATIN CALCIUM 20 MG PO TABS: 20.0000 mg | ORAL_TABLET | Freq: Every day | ORAL | 3 refills | Status: DC

## 2018-12-27 MED ORDER — METFORMIN HCL 500 MG PO TABS: 500.0000 mg | ORAL_TABLET | Freq: Every day | ORAL | 3 refills | Status: DC

## 2018-12-27 NOTE — Assessment & Plan Note (Signed)
Has been off atorvastatin. Refilled today. Labs today. The 10-year ASCVD risk score Mikey Bussing DC Brooke Bonito., et al., 2013) is: 20.1%   Values used to calculate the score:     Age: 68 years     Sex: Female     Is Non-Hispanic African American: Yes     Diabetic: Yes     Tobacco smoker: No     Systolic Blood Pressure: [id number] mmHg     Is BP treated: No     HDL Cholesterol: 47.2 mg/dL     Total Cholesterol: 181 mg/dL

## 2018-12-27 NOTE — Patient Instructions (Addendum)
Labs today Pneumovax today. Get flu shot at your convenience.  If interested, check with pharmacy about new 2 shot shingles series (shingrix).  Advanced directive packet provided today.  Schedule eye exam at your convenience (consider [facility]).  You are doing well today.  Return as needed or in 6 months for diabetes check.   Health Maintenance After Age 67 After age 67, you are at a higher risk for certain long-term diseases and infections as well as injuries from falls. Falls are a major cause of broken bones and head injuries in people who are older than age 55. Getting regular preventive care can help to keep you healthy and well. Preventive care includes getting regular testing and making lifestyle changes as recommended by your health care provider. Talk with your health care provider about:  Which screenings and tests you should have. A screening is a test that checks for a disease when you have no symptoms.  A diet and exercise plan that is right for you. What should I know about screenings and tests to prevent falls? Screening and testing are the best ways to find a health problem early. Early diagnosis and treatment give you the best chance of managing medical conditions that are common after age 17. Certain conditions and lifestyle choices may make you more likely to have a fall. Your health care provider may recommend:  Regular vision checks. Poor vision and conditions such as cataracts can make you more likely to have a fall. If you wear glasses, make sure to get your prescription updated if your vision changes.  Medicine review. Work with your health care provider to regularly review all of the medicines you are taking, including over-the-counter medicines. Ask your health care provider about any side effects that may make you more likely to have a fall. Tell your health care provider if any medicines that you take make you feel dizzy or sleepy.  Osteoporosis  screening. Osteoporosis is a condition that causes the bones to get weaker. This can make the bones weak and cause them to break more easily.  Blood pressure screening. Blood pressure changes and medicines to control blood pressure can make you feel dizzy.  Strength and balance checks. Your health care provider may recommend certain tests to check your strength and balance while standing, walking, or changing positions.  Foot health exam. Foot pain and numbness, as well as not wearing proper footwear, can make you more likely to have a fall.  Depression screening. You may be more likely to have a fall if you have a fear of falling, feel emotionally low, or feel unable to do activities that you used to do.  Alcohol use screening. Using too much alcohol can affect your balance and may make you more likely to have a fall. What actions can I take to lower my risk of falls? General instructions  Talk with your health care provider about your risks for falling. Tell your health care provider if: ? You fall. Be sure to tell your health care provider about all falls, even ones that seem minor. ? You feel dizzy, sleepy, or off-balance.  Take over-the-counter and prescription medicines only as told by your health care provider. These include any supplements.  Eat a healthy diet and maintain a healthy weight. A healthy diet includes low-fat dairy products, low-fat (lean) meats, and fiber from whole grains, beans, and lots of fruits and vegetables. Home safety  Remove any tripping hazards, such as rugs, cords, and clutter.  Install safety equipment such as grab bars in bathrooms and safety rails on stairs.  Keep rooms and walkways well-lit. Activity   Follow a regular exercise program to stay fit. This will help you maintain your balance. Ask your health care provider what types of exercise are appropriate for you.  If you need a cane or walker, use it as recommended by your health care  provider.  Wear supportive shoes that have nonskid soles. Lifestyle  Do not drink alcohol if your health care provider tells you not to drink.  If you drink alcohol, limit how much you have: ? 0-1 drink a day for women. ? 0-2 drinks a day for men.  Be aware of how much alcohol is in your drink. In the [place] one drink equals one typical bottle of beer (12 oz), one-half glass of wine (5 oz), or one shot of hard liquor (1 oz).  Do not use any products that contain nicotine or tobacco, such as cigarettes and e-cigarettes. If you need help quitting, ask your health care provider. Summary  Having a healthy lifestyle and getting preventive care can help to protect your health and wellness after age 50.  Screening and testing are the best way to find a health problem early and help you avoid having a fall. Early diagnosis and treatment give you the best chance for managing medical conditions that are more common for people who are older than age 28.  Falls are a major cause of broken bones and head injuries in people who are older than age 46. Take precautions to prevent a fall at home.  Work with your health care provider to learn what changes you can make to improve your health and wellness and to prevent falls. This information is not intended to replace advice given to you by your health care provider. Make sure you discuss any questions you have with your health care provider. Document Released: 12/10/2016 Document Revised: 05/20/2018 Document Reviewed: 12/10/2016 Elsevier Patient Education  2020 [employer].

## 2018-12-27 NOTE — Assessment & Plan Note (Signed)
Rare. Carries diagnosis of parox afib - but unclear if truly afib in the past as she is not on blood thinner or regular metoprolol. States she takes beta blocker PRN palpitations.

## 2018-12-27 NOTE — Progress Notes (Signed)
This visit was conducted in person.  BP 130/70 (BP Location: Left Arm, Patient Position: Sitting, Cuff Size: Normal)   Pulse 68   Temp 98.2 F (36.8 C) (Temporal)   Ht 5' 2.25" (1.581 m)   Wt 189 lb 8 oz (86 kg)   SpO2 98%   BMI 34.38 kg/m    CC: CPE Subjective:    Patient ID: Brandy Guerrero, female    DOB: [birth year], 68 y.o.   MRN: [id number]  HPI: Brandy Guerrero is a 68 y.o. female presenting on 12/27/2018 for Annual Exam   Saw GYN Kenton Kingfisher) and gen surg [facility]) considering longstanding pelvic mass excision and concomitant umbilical hernia repair.   [place] - to start new job next month. 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 Some depressed mood. Some marital stressors - unhappy in marriage. Does not want to seek couples counseling.   Preventative: Colon cancer screening - iFOB positive, s/p colonoscopy 01/2012 [name]) with diverticulosis, but no reason for bleed found. Rpt stool cards negative. Otherwise rpt colonoscopy 10 yrs.  Well woman with [name] - s/p TAH for fibroids, one ovary remains. Established with [name] 2020.  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 Rpt mammo 11/2018 - biopsy proven ADH calcifications L breast - pt declined surgery - rec f/u bilateral diagnostic mammo 1 yr.  DEXA 2013 WNL  Flu shotyearly - will return for this Prevnar - 04/2017, pneumovax today Tdap 2013  Shingrix - discussed  Advanced planning - does not have set up. Doesn't know who HCPOA would be. Packet provided today  Seat belt use discussed. Sunscreen use discussed.No changing moles on skin. 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  Activity: starting regular walking - 3 mi about 2d/wk  Diet: good water, fruits/vegetables daily     Relevant past medical, surgical, family and social history reviewed and updated as indicated. Interim medical history since our last visit reviewed. Allergies and medications reviewed and updated. Outpatient Medications Prior to Visit  Medication Sig Dispense Refill  . atorvastatin (LIPITOR) 20 MG tablet Take 1 tablet (20 mg total) by mouth daily. 30 tablet 6  . metFORMIN (GLUCOPHAGE) 500 MG tablet Take 1 tablet (500 mg total) by mouth daily with breakfast. 90 tablet 3  . metoprolol tartrate (LOPRESSOR) 25 MG tablet Take 1 tablet (25 mg total) by mouth 2 (two) times daily as needed. Heart fluttering 60 tablet 1  . aspirin 81 MG tablet Take 81 mg by mouth daily.    Marland Kitchen FLUOCINOLONE ACETONIDE SCALP 0.01 % external oil as directed.    Marland Kitchen ketoconazole (NIZORAL) 2 % shampoo as directed.    . methocarbamol (ROBAXIN) 500 MG tablet Take 1 tablet (500 mg total) by mouth 3 (three) times daily as needed for muscle spasms (sedation precautions). 40 tablet 0  . NON FORMULARY Hair and nail vitamin    . orlistat (ALLI) 60 MG capsule Take 60 mg by mouth 2 (two) times daily with a meal.    . phenazopyridine (PYRIDIUM) 100 MG tablet Take 1 tablet (100 mg  total) by mouth 3 (three) times daily as needed for pain. 10 tablet 0  . simvastatin (ZOCOR) 40 MG tablet Take 1 tablet (40 mg total) by mouth daily at 6 PM. 90 tablet 3   No facility-administered medications prior to visit.      Per HPI unless specifically indicated in ROS section below Review of Systems  Constitutional: Negative for activity change, appetite change, chills, fatigue, fever and unexpected weight change.  HENT: Negative for hearing loss.   Eyes: Negative for visual disturbance.  Respiratory: Negative for cough, chest  tightness, shortness of breath and wheezing.   Cardiovascular: Positive for palpitations (occasional). Negative for chest pain and leg swelling.  Gastrointestinal: Negative for abdominal distention, abdominal pain, blood in stool, constipation, diarrhea, nausea and vomiting.  Genitourinary: Negative for difficulty urinating and hematuria.  Musculoskeletal: Negative for arthralgias, myalgias and neck pain.  Skin: Negative for rash.  Neurological: Negative for dizziness, seizures, syncope and headaches.  Hematological: Negative for adenopathy. Bruises/bleeds easily.  Psychiatric/Behavioral: Positive for dysphoric mood. The patient is not nervous/anxious.    Objective:    BP 130/70 (BP Location: Left Arm, Patient Position: Sitting, Cuff Size: Normal)   Pulse 68   Temp 98.2 F (36.8 C) (Temporal)   Ht 5' 2.25" (1.581 m)   Wt 189 lb 8 oz (86 kg)   SpO2 98%   BMI 34.38 kg/m   Wt Readings from Last 3 Encounters:  12/27/18 189 lb 8 oz (86 kg)  11/03/18 187 lb (84.8 kg)  09/24/18 185 lb 2 oz (84 kg)    Physical Exam Vitals signs and nursing note reviewed.  Constitutional:      General: She is not in acute distress.    Appearance: Normal appearance. She is well-developed. She is obese. She is not ill-appearing.  HENT:     Head: Normocephalic and atraumatic.     Right Ear: Hearing, tympanic membrane, ear canal and external ear normal.     Left Ear: Hearing, tympanic membrane, ear canal and external ear normal.     Nose: Nose normal.     Mouth/Throat:     Mouth: Mucous membranes are moist.     Pharynx: Oropharynx is clear. Uvula midline. No posterior oropharyngeal erythema.  Eyes:     General: No scleral icterus.    Extraocular Movements: Extraocular movements intact.     Conjunctiva/sclera: Conjunctivae normal.     Pupils: Pupils are equal, round, and reactive to light.  Neck:     Musculoskeletal: Normal range of motion and neck supple.  Cardiovascular:     Rate and Rhythm: Normal  rate and regular rhythm.     Pulses: Normal pulses.          Radial pulses are 2+ on the right side and 2+ on the left side.     Heart sounds: Normal heart sounds. No murmur.  Pulmonary:     Effort: Pulmonary effort is normal. No respiratory distress.     Breath sounds: Normal breath sounds. No wheezing, rhonchi or rales.  Abdominal:     General: Abdomen is flat. Bowel sounds are normal. There is no distension.     Palpations: Abdomen is soft. There is no mass.     Tenderness: There is no abdominal tenderness. There is no guarding or rebound.     Hernia: A hernia is present. Hernia is present in the umbilical area (reproducible, nontender).  Musculoskeletal: Normal range of motion.     Right lower leg: No edema.  Left lower leg: No edema.  Lymphadenopathy:     Cervical: No cervical adenopathy.  Skin:    General: Skin is warm and dry.     Findings: No rash.  Neurological:     General: No focal deficit present.     Mental Status: She is alert and oriented to person, place, and time.     Comments: CN grossly intact, station and gait intact  Psychiatric:        Mood and Affect: Mood normal.        Behavior: Behavior normal.        Thought Content: Thought content normal.        Judgment: Judgment normal.       Results for orders placed or performed in visit on 12/22/18  TB Skin Test  Result Value Ref Range   TB Skin Test Negative    Induration 0 mm   Assessment & Plan:   Problem List Items Addressed This Visit    Umbilical hernia    Appreciate gen surg eval. Aware can have both hernia repair and pelvic mass excision at same time. Will call gen surg when she decides to schedule operations.       Pelvic mass in female    Appreciate GYN eval - saw [name], has decided to postpone surgery at this time. Knows to call GYN if decides to proceed with surgery.       Palpitations    Rare. Carries diagnosis of parox afib - but unclear if truly afib in the past as she is not on  blood thinner or regular metoprolol. States she takes beta blocker PRN palpitations.       Obesity, Class I, BMI 30-34.9    Weight gain noted. Encouraged healthy diet and lifestyle changes to affect sustainable weight loss.      Hyperlipidemia associated with type 2 diabetes mellitus ([place])    Has been off atorvastatin. Refilled today. Labs today. The 10-year ASCVD risk score Mikey Bussing DC Brooke Bonito., et al., 2013) is: 20.1%   Values used to calculate the score:     Age: 69 years     Sex: Female     Is Non-Hispanic African American: Yes     Diabetic: Yes     Tobacco smoker: No     Systolic Blood Pressure: [id number] mmHg     Is BP treated: No     HDL Cholesterol: 47.2 mg/dL     Total Cholesterol: 181 mg/dL       Relevant Medications   atorvastatin (LIPITOR) 20 MG tablet   metFORMIN (GLUCOPHAGE) 500 MG tablet   Encounter for routine adult medical exam with abnormal findings - Primary    Preventative protocols reviewed and updated unless pt declined. Discussed healthy diet and lifestyle.       Controlled type 2 diabetes mellitus without complication, without long-term current use of insulin (HCC)    Chronic, update A1c. Continue metformin. RTC 6 mo DM f/u visit.       Relevant Medications   atorvastatin (LIPITOR) 20 MG tablet   metFORMIN (GLUCOPHAGE) 500 MG tablet   Other Relevant Orders   Hemoglobin A1c   Microalbumin / creatinine urine ratio   Atypical ductal hyperplasia of left breast    Recent mammogram stable - has declined surgery. rec rpt dx mammo 68yr.       Advanced care planning/counseling discussion    Advanced planning - does not have set up. Doesn't know who HCPOA would be. Packet provided  today        Other Visit Diagnoses    Need for 23-polyvalent pneumococcal polysaccharide vaccine       Relevant Orders   Pneumococcal polysaccharide vaccine 23-valent greater than or equal to 2yo subcutaneous/IM (Completed)       Meds ordered this encounter  Medications  .  atorvastatin (LIPITOR) 20 MG tablet    Sig: Take 1 tablet (20 mg total) by mouth daily.    Dispense:  90 tablet    Refill:  3  . metFORMIN (GLUCOPHAGE) 500 MG tablet    Sig: Take 1 tablet (500 mg total) by mouth daily with breakfast.    Dispense:  90 tablet    Refill:  3  . metoprolol tartrate (LOPRESSOR) 25 MG tablet    Sig: Take 1 tablet (25 mg total) by mouth 2 (two) times daily as needed. Heart fluttering    Dispense:  60 tablet    Refill:  3   Orders Placed This Encounter  Procedures  . Pneumococcal polysaccharide vaccine 23-valent greater than or equal to 2yo subcutaneous/IM  . Lipid panel  . Comprehensive metabolic panel  . TSH  . Hemoglobin A1c  . Microalbumin / creatinine urine ratio    Patient instructions: Labs today Pneumovax today. Get flu shot at your convenience.  If interested, check with pharmacy about new 2 shot shingles series (shingrix).  Advanced directive packet provided today.  Schedule eye exam at your convenience (consider [facility]).  You are doing well today.  Return as needed or in 6 months for diabetes check.   Follow up plan: Return in about 6 months (around 06/26/2019) for follow up visit.  Ria Bush, MD

## 2018-12-27 NOTE — Assessment & Plan Note (Signed)
Preventative protocols reviewed and updated unless pt declined. Discussed healthy diet and lifestyle.  

## 2018-12-27 NOTE — Assessment & Plan Note (Signed)
Appreciate GYN eval - saw [name], has decided to postpone surgery at this time. Knows to call GYN if decides to proceed with surgery.

## 2018-12-27 NOTE — Assessment & Plan Note (Signed)
Weight gain noted. Encouraged healthy diet and lifestyle changes to affect sustainable weight loss.  

## 2018-12-27 NOTE — Assessment & Plan Note (Signed)
Advanced planning - does not have set up. Doesn't know who HCPOA would be. Packet provided today

## 2018-12-27 NOTE — Assessment & Plan Note (Signed)
Appreciate gen surg eval. Aware can have both hernia repair and pelvic mass excision at same time. Will call gen surg when she decides to schedule operations.

## 2018-12-27 NOTE — Assessment & Plan Note (Signed)
Chronic, update A1c. Continue metformin. RTC 6 mo DM f/u visit.

## 2018-12-27 NOTE — Assessment & Plan Note (Signed)
Recent mammogram stable - has declined surgery. rec rpt dx mammo 15yr.

## 2018-12-30 ENCOUNTER — Telehealth: Payer: Self-pay | Admitting: Family Medicine

## 2018-12-30 NOTE — Telephone Encounter (Signed)
Noted  

## 2018-12-30 NOTE — Telephone Encounter (Signed)
Patient returned call about her lab results. She stated she would try to call back before we leave today.  She is in a meeting and it is hard for her to answer her phone

## 2018-12-31 NOTE — Telephone Encounter (Signed)
Pt aware of results and that they have been mailed.

## 2019-01-04 ENCOUNTER — Other Ambulatory Visit: Payer: Commercial Managed Care - PPO

## 2019-01-05 ENCOUNTER — Encounter: Payer: Commercial Managed Care - PPO | Admitting: Family Medicine

## 2019-02-28 ENCOUNTER — Telehealth: Payer: Self-pay

## 2019-02-28 ENCOUNTER — Emergency Department
Admission: EM | Admit: 2019-02-28 | Discharge: 2019-02-28 | Disposition: A | Discharge status: Home / Self Care | Payer: Medicare Other | Attending: Emergency Medicine | Admitting: Emergency Medicine

## 2019-02-28 ENCOUNTER — Emergency Department: Payer: Medicare Other

## 2019-02-28 ENCOUNTER — Other Ambulatory Visit: Payer: Self-pay

## 2019-02-28 ENCOUNTER — Encounter: Payer: Self-pay | Admitting: Emergency Medicine

## 2019-02-28 DIAGNOSIS — E119 Type 2 diabetes mellitus without complications: Secondary | ICD-10-CM | POA: Diagnosis not present

## 2019-02-28 DIAGNOSIS — Z7984 Long term (current) use of oral hypoglycemic drugs: Secondary | ICD-10-CM | POA: Insufficient documentation

## 2019-02-28 DIAGNOSIS — R42 Dizziness and giddiness: Secondary | ICD-10-CM | POA: Diagnosis present

## 2019-02-28 DIAGNOSIS — Z79899 Other long term (current) drug therapy: Secondary | ICD-10-CM | POA: Insufficient documentation

## 2019-02-28 DIAGNOSIS — R110 Nausea: Secondary | ICD-10-CM

## 2019-02-28 DIAGNOSIS — H8110 Benign paroxysmal vertigo, unspecified ear: Secondary | ICD-10-CM

## 2019-02-28 LAB — COMPREHENSIVE METABOLIC PANEL
ALT: 19 U/L (ref 0–44)
AST: 19 U/L (ref 15–41)
Albumin: 3.7 g/dL (ref 3.5–5.0)
Alkaline Phosphatase: 104 U/L (ref 38–126)
Anion gap: 8 (ref 5–15)
BUN: 11 mg/dL (ref 8–23)
CO2: 28 mmol/L (ref 22–32)
Calcium: 8.8 mg/dL — ABNORMAL LOW (ref 8.9–10.3)
Chloride: 105 mmol/L (ref 98–111)
Creatinine, Ser: 0.74 mg/dL (ref 0.44–1.00)
GFR calc Af Amer: >60 mL/min (ref >60)
GFR calc non Af Amer: >60 mL/min (ref >60)
Glucose, Bld: 186 mg/dL — ABNORMAL HIGH (ref 70–99)
Potassium: 3.8 mmol/L (ref 3.5–5.1)
Sodium: 141 mmol/L (ref 135–145)
Total Bilirubin: 0.8 mg/dL (ref 0.3–1.2)
Total Protein: 7.3 g/dL (ref 6.5–8.1)

## 2019-02-28 LAB — CBC
HCT: 41.1 % (ref 36.0–46.0)
Hemoglobin: 12.7 g/dL (ref 12.0–15.0)
MCH: 29.6 pg (ref 26.0–34.0)
MCHC: 30.9 g/dL (ref 30.0–36.0)
MCV: 95.8 fL (ref 80.0–100.0)
Platelets: 268 10*3/uL (ref 150–400)
RBC: 4.29 MIL/uL (ref 3.87–5.11)
RDW: 12.4 % (ref 11.5–15.5)
WBC: 6.9 10*3/uL (ref 4.0–10.5)
nRBC: 0 % (ref 0.0–0.2)

## 2019-02-28 MED ORDER — ONDANSETRON 4 MG PO TBDP: 4.0000 mg | ORAL_TABLET | Freq: Three times a day (TID) | ORAL | 0 refills | Status: DC | PRN

## 2019-02-28 MED ORDER — MECLIZINE HCL 25 MG PO TABS
25.0000 mg | ORAL_TABLET | Freq: Once | ORAL | Status: AC
  Administered 2019-02-28: 12:00:00 25 mg via ORAL
  Filled 2019-02-28: qty 1

## 2019-02-28 MED ORDER — MECLIZINE HCL 25 MG PO TABS: 25.0000 mg | ORAL_TABLET | Freq: Three times a day (TID) | ORAL | 0 refills | Status: DC | PRN

## 2019-02-28 MED ORDER — ONDANSETRON 4 MG PO TBDP
4.0000 mg | ORAL_TABLET | Freq: Once | ORAL | Status: AC
  Administered 2019-02-28: 4 mg via ORAL
  Filled 2019-02-28: qty 1

## 2019-02-28 NOTE — ED Notes (Signed)
Pt provided with warm blanket, pt visualized in NAD at this time. Pt denies any needs.

## 2019-02-28 NOTE — Telephone Encounter (Signed)
Seen at ER, sent home. plz offer f/u office visit for ER f/u.

## 2019-02-28 NOTE — ED Notes (Signed)
Pt c/o "dizzy spell" that started yesterday, and L shoulder pain. Pt is alert and oriented at this time. NAD noted, ambulatory without difficulty. Pt denies numbness and tingling at this time. Speech clear, full ROM noted to all extremities at this time.

## 2019-02-28 NOTE — Telephone Encounter (Signed)
Per chart review tab pt is at [facility] ED. 

## 2019-02-28 NOTE — Telephone Encounter (Signed)
[place] Night - Client TELEPHONE ADVICE RECORD [facility] Patient Name: Brandy Guerrero Gender: Female DOB: 19-Jun-1950 Age: 69 Y 55 M 10 D Return Phone Number: [phone] (Primary) Address: [place] [address] Client [place] Night - Client Client Site [place] Physician Ria Bush - MD Contact Type Call Who Is Calling Patient / Member / Family / Caregiver Call Type Triage / Clinical Relationship To Patient Self Return Phone Number [phone] (Primary) Chief Complaint Dizziness Reason for Call Symptomatic / Request for Brandy Guerrero states she has been having dizziness, nausea and vomited once yesterday. She is still having dizziness. Translation No Nurse Assessment Nurse: Fredderick Phenix, RN, Lelan Pons Date/Time (Eastern Time): 02/28/2019 8:05:30 AM Confirm and document reason for call. If symptomatic, describe symptoms. ---Caller states she has been having dizziness, nausea and vomited once yesterday. She is still having dizziness, feels like she needs support to walk. This has been going on since July but didn't tell MD. BP is in the [id number] systolic. Denies fever. Has the patient had close contact with a person known or suspected to have the novel coronavirus illness OR traveled / lives in area with major community spread (including international travel) in the last 14 days from the onset of symptoms? * If Asymptomatic, screen for exposure and travel within the last 14 days. ---No Does the patient have any new or worsening symptoms? ---Yes Will a triage be completed? ---Yes Related visit to physician within the last 2 weeks? ---No Does the PT have any chronic conditions? (i.e. diabetes, asthma, this includes High risk factors for pregnancy, etc.) ---No Is this a behavioral health or substance abuse call? ---No Guidelines Guideline Title Affirmed  Question Affirmed Notes Nurse Date/Time (Eastern Time) Dizziness - Lightheadedness SEVERE dizziness (e.g., unable to stand, requires support to walk, feels like passing out now) Fredderick Phenix, RN, Lelan Pons 02/28/2019 8:10:02 AM Disp. Time Eilene Ghazi Time) Disposition Final User PLEASE NOTE: All timestamps contained within this report are represented as [place] Standard Time. CONFIDENTIALTY NOTICE: This fax transmission is intended only for the addressee. It contains information that is legally privileged, confidential or otherwise protected from use or disclosure. If you are not the intended recipient, you are strictly prohibited from reviewing, disclosing, copying using or disseminating any of this information or taking any action in reliance on or regarding this information. If you have received this fax in error, please notify us immediately by telephone so that we can arrange for its return to [place]. Phone: [phone], Toll-Free: [phone], Fax: [phone] Page: 2 of 2 Call Id: [id number] 02/28/2019 8:15:23 AM Go to ED Now (or PCP triage) Yes Fredderick Phenix, RN, [place] Disagree/Comply Comply Caller Understands Yes PreDisposition Did not know what to do Care Advice Given Per Guideline GO TO ED NOW (OR PCP TRIAGE): * IF NO PCP (PRIMARY CARE PROVIDER) SECOND-LEVEL TRIAGE: You need to be seen within the next hour. Go to the [facility] at _____________ [place] as soon as you can. ANOTHER ADULT SHOULD DRIVE: * It is better and safer if another adult drives instead of you. CARE ADVICE given per Dizziness (Adult) guideline. BRING MEDICINES: Referrals [facility] - ED

## 2019-02-28 NOTE — ED Triage Notes (Signed)
Pt reports intermittent dizziness and pain to her right arm since yesterday. Pt states this happens intermittently.

## 2019-02-28 NOTE — Telephone Encounter (Signed)
Attempted to contact pt.  No answer.  Vm box is full.  Need to schedule ER follow-up.

## 2019-02-28 NOTE — ED Notes (Signed)
NAD noted at time of D/C. Pt c/o continued dizziness at time of D/C. This RN offered for MD to come re-evaluate patient, pt refused, states she wants to go home and take the medication.  Pt ambulatory to the lobby at this time with steady gait.

## 2019-02-28 NOTE — ED Provider Notes (Signed)
[facility] Emergency Department Provider Note   ____________________________________________   First MD Initiated Contact with Patient 02/28/19 1114     (approximate)  I have reviewed the triage vital signs and the nursing notes.   HISTORY  Chief Complaint Dizziness and Arm Pain    HPI Brandy Guerrero is a 69 y.o. female with past medical history of anxiety, diabetes, hyperlipidemia who presents to the ED complaining of dizziness and shoulder pain.  Patient reports that she has had intermittent episodes of "dizzy spells" over the past few months, but that it became more severe last night.  The episodes seem to be worse when she goes to stand up and move around and ease up when she is at rest.  She describes it as like the room is spinning around her but also reports an element of lightheadedness.  She has not had any chest pain or shortness of breath with these episodes and denies any changes in her vision or speech.  She has not had any associated numbness or weakness.  She has never been evaluated by Dr. For these episodes in the past.  She also complains of pain in her right shoulder, which is a constant throbbing going on over the past few weeks.  She denies any associated trauma and has not had any difficulty using her right arm.  She was seen by her PCP for similar complaints last month, was prescribed a muscle relaxant but states she did not want to take much of these.        Past Medical History:  Diagnosis Date  . Acne    dermatology - on doxy 100mg  daily  . Anemia   . History of atrial fibrillation 05/2005   h/o parox Afib, nl cardiolite/echo, no prolonged episodes.  metoprolol prn [facility] [place])  . HLD (hyperlipidemia)   . Obesity   . Panic attacks   . Pelvic mass    declines resection  . Positive occult stool blood test 2014   s/p normal colonoscopy, rpt stool cards negative  . Prediabetes     Patient Active Problem List   Diagnosis Date Noted    . Insomnia 09/24/2018  . Advanced care planning/counseling discussion 10/28/2017  . Atypical ductal hyperplasia of left breast 06/17/2016  . Right shoulder pain 03/31/2013  . Encounter for routine adult medical exam with abnormal findings 10/21/2011  . Controlled type 2 diabetes mellitus without complication, without long-term current use of insulin ([place]) 10/21/2011  . Palpitations 09/17/2011  . Umbilical hernia [id number]  . Obesity, Class I, BMI 30-34.9 09/17/2011  . Panic attacks   . Hyperlipidemia associated with type 2 diabetes mellitus ([place])   . Acne   . Pelvic mass in female     Past Surgical History:  Procedure Laterality Date  . ABDOMINAL HYSTERECTOMY  1990s   for fibroids, has one ovary remaining ([place]). partial  . BREAST BIOPSY    . COLONOSCOPY  01/2012   mod diverticulosis, rec rpt stool cards and if + rec EGD (returned negative), rpt colonoscopy 10 yrs    Prior to Admission medications   Medication Sig Start Date End Date Taking? Authorizing Provider  atorvastatin (LIPITOR) 20 MG tablet Take 1 tablet (20 mg total) by mouth daily. 12/27/18   Ria Bush, MD  meclizine (ANTIVERT) 25 MG tablet Take 1 tablet (25 mg total) by mouth 3 (three) times daily as needed for dizziness. 02/28/19   Blake Divine, MD  metFORMIN (GLUCOPHAGE) 500 MG tablet Take 1  tablet (500 mg total) by mouth daily with breakfast. 12/27/18   Ria Bush, MD  metoprolol tartrate (LOPRESSOR) 25 MG tablet Take 1 tablet (25 mg total) by mouth 2 (two) times daily as needed. Heart fluttering 12/27/18 06/03/21  Ria Bush, MD  ondansetron (ZOFRAN ODT) 4 MG disintegrating tablet Take 1 tablet (4 mg total) by mouth every 8 (eight) hours as needed for nausea or vomiting. 02/28/19   Blake Divine, MD    Allergies Sulfa antibiotics  Family History  Problem Relation Age of Onset  . Hypertension Mother   . Alcohol abuse Brother   . Alcohol abuse Father   . Diabetes Daughter   .  Cancer Neg Hx   . Coronary artery disease Neg Hx   . Stroke Neg Hx     Social History Social History   Tobacco Use  . Smoking status: Never Smoker  . Smokeless tobacco: Never Used  Substance Use Topics  . Alcohol use: Yes    Alcohol/week: 0.0 standard drinks    Comment: Rare  . Drug use: No    Review of Systems  Constitutional: No fever/chills Eyes: No visual changes. ENT: No sore throat. Cardiovascular: Denies chest pain. Respiratory: Denies shortness of breath. Gastrointestinal: No abdominal pain.  No nausea, no vomiting.  No diarrhea.  No constipation. Genitourinary: Negative for dysuria. Musculoskeletal: Negative for back pain.  Positive for right shoulder pain. Skin: Negative for rash. Neurological: Negative for headaches, focal weakness or numbness.  Positive for dizziness.  ____________________________________________   PHYSICAL EXAM:  VITAL SIGNS: ED Triage Vitals  Enc Vitals Group     BP 02/28/19 0924 134/65     Pulse Rate 02/28/19 0924 64     Resp 02/28/19 0924 19     Temp 02/28/19 0924 98.1 F (36.7 C)     Temp Source 02/28/19 0924 Oral     SpO2 02/28/19 0924 100 %     Weight 02/28/19 0901 189 lb (85.7 kg)     Height 02/28/19 0901 5\' 3"  (1.6 m)     Head Circumference --      Peak Flow --      Pain Score 02/28/19 0901 0     Pain Loc --      Pain Edu? --      Excl. in [place]? --     Constitutional: Alert and oriented. Eyes: Conjunctivae are normal.  Pupils equal round and reactive to light bilaterally, extraocular movements intact without nystagmus. Head: Atraumatic. Nose: No congestion/rhinnorhea. Mouth/Throat: Mucous membranes are moist. Neck: Normal ROM Cardiovascular: Normal rate, regular rhythm. Grossly normal heart sounds. Respiratory: Normal respiratory effort.  No retractions. Lungs CTAB. Gastrointestinal: Soft and nontender. No distention. Genitourinary: deferred Musculoskeletal: No lower extremity tenderness nor edema.  Diffuse  tenderness of right shoulder, pain with resistance of right shoulder abduction. Neurologic:  Normal speech and language. No gross focal neurologic deficits are appreciated.  5-5 strength in bilateral upper and lower extremities, no pronator drift. Skin:  Skin is warm, dry and intact. No rash noted. Psychiatric: Mood and affect are normal. Speech and behavior are normal.  ____________________________________________   LABS (all labs ordered are listed, but only abnormal results are displayed)  Labs Reviewed  COMPREHENSIVE METABOLIC PANEL - Abnormal; Notable for the following components:      Result Value   Glucose, Bld 186 (*)    Calcium 8.8 (*)    All other components within normal limits  CBC   ____________________________________________  EKG  ED ECG REPORT I,  Blake Divine, the attending physician, personally viewed and interpreted this ECG.   Date: 02/28/2019  EKG Time: 9:29  Rate: 59  Rhythm: normal sinus rhythm  Axis: Normal  Intervals:none  ST&T Change: Isolated T wave inversion in lead III   PROCEDURES  Procedure(s) performed (including Critical Care):  Procedures   ____________________________________________   INITIAL IMPRESSION / ASSESSMENT AND PLAN / ED COURSE       69 year old female presents to the ED complaining of intermittent dizzy spells over the past couple of months, more frequent and severe since last night.  These episodes appear most consistent with a peripheral vertigo given she describes the room spinning around her associated with positional changes.  EKG shows no evidence of arrhythmia, does show isolated T wave inversions but I very much doubt cardiac ischemia.  She is also neurologically intact, will trial dose of meclizine and Zofran, reevaluate.  She also complains of pain to her right shoulder, which has been ongoing for at least 1 month.  We will screen shoulder x-ray as she has not had this yet, but she is neurovascularly intact to  her right upper extremity and I doubt cardiac etiology given association with movement of her right arm.  X-rays of right shoulder are negative.  Patient no longer feeling nauseous after dose of Zofran, but continues to complain of some dizziness following dose of meclizine.  I have offered her MRI to rule out posterior stroke, however she declines this and wishes to be discharged home with prescription for meclizine.  I have counseled her to follow-up with her PCP and return to the ED for new or worsening symptoms.  Patient agrees with plan.      ____________________________________________   FINAL CLINICAL IMPRESSION(S) / ED DIAGNOSES  Final diagnoses:  Dizziness  Benign paroxysmal positional vertigo, unspecified laterality  Nausea without vomiting     ED Discharge Orders         Ordered    meclizine (ANTIVERT) 25 MG tablet  3 times daily PRN     02/28/19 1333    ondansetron (ZOFRAN ODT) 4 MG disintegrating tablet  Every 8 hours PRN     02/28/19 1333           Note:  This document was prepared using Dragon voice recognition software and may include unintentional dictation errors.   Blake Divine, MD 02/28/19 [phone]

## 2019-03-02 ENCOUNTER — Telehealth: Payer: Self-pay | Admitting: Family Medicine

## 2019-03-02 NOTE — Telephone Encounter (Signed)
Attempted to contact pt.  No answer.  Vm box is full.  Need to schedule ER follow-up.

## 2019-03-02 NOTE — Telephone Encounter (Signed)
Yes, Fri @ 2:45 will work since that's the only slot left.  Pls schedule as hosp f/u.  I've been trying to reach her to get this scheduled. [See TE, 02/28/19.]

## 2019-03-02 NOTE — Telephone Encounter (Signed)
See TE, 03/02/19.

## 2019-03-02 NOTE — Telephone Encounter (Signed)
Patient called today She stated she had to go to the ED on Monday because she thought the office was closed. Patient was diagnosed with vertigo. Patient said she is still having a headache and wanted to come in to see Dr Darnell Level.   Advised that headaches right now are on the list for symptoms that cant come into the office but patient said because she has vertigo that causes the headaches.  Patient is requesting a call back.   Is she okay to come into the office?? Pending appt for Friday @ 2:45

## 2019-03-04 ENCOUNTER — Encounter: Payer: Self-pay | Admitting: Family Medicine

## 2019-03-04 ENCOUNTER — Other Ambulatory Visit: Payer: Self-pay

## 2019-03-04 ENCOUNTER — Ambulatory Visit (INDEPENDENT_AMBULATORY_CARE_PROVIDER_SITE_OTHER): Payer: Medicare Other | Admitting: Family Medicine

## 2019-03-04 VITALS — BP 134/66 | HR 84 | Temp 97.5°F | Ht 63.0 in | Wt 195.4 lb

## 2019-03-04 DIAGNOSIS — E1169 Type 2 diabetes mellitus with other specified complication: Secondary | ICD-10-CM | POA: Diagnosis not present

## 2019-03-04 DIAGNOSIS — M25511 Pain in right shoulder: Secondary | ICD-10-CM

## 2019-03-04 DIAGNOSIS — E785 Hyperlipidemia, unspecified: Secondary | ICD-10-CM

## 2019-03-04 DIAGNOSIS — H814 Vertigo of central origin: Secondary | ICD-10-CM | POA: Diagnosis not present

## 2019-03-04 DIAGNOSIS — E119 Type 2 diabetes mellitus without complications: Secondary | ICD-10-CM | POA: Diagnosis not present

## 2019-03-04 DIAGNOSIS — R42 Dizziness and giddiness: Secondary | ICD-10-CM | POA: Diagnosis not present

## 2019-03-04 NOTE — Patient Instructions (Signed)
Strange that you continue to have vertigo 6 days into symptoms.  Try tapering down on meclizine. I recommend head imaging - with brain MRI.  If normal, we will refer you to ENT for further evaluation of vertigo.

## 2019-03-04 NOTE — Progress Notes (Addendum)
This visit was conducted in person.  BP 134/66 (BP Location: Right Arm, Patient Position: Sitting, Cuff Size: Large)   Pulse 84   Temp (!) 97.5 F (36.4 C) (Temporal)   Ht 5\' 3"  (1.6 m)   Wt 195 lb 7 oz (88.6 kg)   SpO2 99%   BMI 34.62 kg/m    CC: ER f/u visit Subjective:    Patient ID: Brandy Guerrero, female    DOB: [birth year], 69 y.o.   MRN: [id number]  HPI: Brandy Guerrero is a 69 y.o. female presenting on 03/04/2019 for Hospitalization Follow-up (Seen at [facility] ED on 02/28/19.  Still c/o HA, vertigo episodes and throbbing in right shoulder. )   Recent ER evaluation 02/27/2018 for HA, dizziness and R shoulder pain. Diagnosed with vertigo, treated with meclizine and zofran with benefit. MRI declined at ER.   She endorses room spinning vertigo since 02/26/2019, progressively worsened on Monday 02/28/2019 when she seeked ER care. Vertigo worse with sudden head turns or position changes, associated with nausea/vomiting. Sitting and resting alleviated some. This was associated with whooshing ringing in R ear. No preceding URI or associated hearing loss.   She continues taking meclizine TID with benefit, but with some ongoing vertigo.   She has had some dizziness in the past, but not as bad as this past week.   Also noted R shoulder "toothe ache" pain this week without inciting trauma/injury - xray at ER was reassuring.      Relevant past medical, surgical, family and social history reviewed and updated as indicated. Interim medical history since our last visit reviewed. Allergies and medications reviewed and updated. Outpatient Medications Prior to Visit  Medication Sig Dispense Refill  . atorvastatin (LIPITOR) 20 MG tablet Take 1 tablet (20 mg total) by mouth daily. 90 tablet 3  . meclizine (ANTIVERT) 25 MG tablet Take 1 tablet (25 mg total) by mouth 3 (three) times daily as needed for dizziness. 30 tablet 0  . metFORMIN (GLUCOPHAGE) 500 MG tablet Take 1 tablet (500 mg total) by  mouth daily with breakfast. 90 tablet 3  . metoprolol tartrate (LOPRESSOR) 25 MG tablet Take 1 tablet (25 mg total) by mouth 2 (two) times daily as needed. Heart fluttering 60 tablet 3  . ondansetron (ZOFRAN ODT) 4 MG disintegrating tablet Take 1 tablet (4 mg total) by mouth every 8 (eight) hours as needed for nausea or vomiting. 12 tablet 0   No facility-administered medications prior to visit.     Per HPI unless specifically indicated in ROS section below Review of Systems Objective:    BP 134/66 (BP Location: Right Arm, Patient Position: Sitting, Cuff Size: Large)   Pulse 84   Temp (!) 97.5 F (36.4 C) (Temporal)   Ht 5\' 3"  (1.6 m)   Wt 195 lb 7 oz (88.6 kg)   SpO2 99%   BMI 34.62 kg/m   Wt Readings from Last 3 Encounters:  03/04/19 195 lb 7 oz (88.6 kg)  02/28/19 189 lb (85.7 kg)  12/27/18 189 lb 8 oz (86 kg)    Physical Exam Vitals and nursing note reviewed.  Constitutional:      Appearance: Normal appearance. She is not ill-appearing.  Eyes:     Extraocular Movements: Extraocular movements intact.     Pupils: Pupils are equal, round, and reactive to light.  Cardiovascular:     Rate and Rhythm: Normal rate and regular rhythm.     Pulses: Normal pulses.     Heart sounds:  Normal heart sounds. No murmur.  Pulmonary:     Effort: Pulmonary effort is normal. No respiratory distress.     Breath sounds: Normal breath sounds. No wheezing, rhonchi or rales.  Musculoskeletal:        General: No tenderness. Normal range of motion.     Comments: FROM at shoulders without reproducible tenderness to palpation   Skin:    General: Skin is warm and dry.     Capillary Refill: Capillary refill takes less than 2 seconds.     Findings: No erythema or rash.  Neurological:     General: No focal deficit present.     Mental Status: She is alert.     Cranial Nerves: Cranial nerves are intact.     Sensory: Sensation is intact.     Motor: Motor function is intact.     Coordination:  Coordination is intact. Romberg sign negative. Coordination normal. Finger-Nose-Finger Test normal.     Gait: Gait is intact.     Comments:  CN 2-12 intact FTN intact  EOMI  Neg dix hallpike  Psychiatric:        Mood and Affect: Mood normal.        Behavior: Behavior normal.       Results for orders placed or performed during the hospital encounter of 02/28/19  CBC  Result Value Ref Range   WBC 6.9 4.0 - 10.5 K/uL   RBC 4.29 3.87 - 5.11 MIL/uL   Hemoglobin 12.7 12.0 - 15.0 g/dL   HCT 41.1 36.0 - 46.0 %   MCV 95.8 80.0 - 100.0 fL   MCH 29.6 26.0 - 34.0 pg   MCHC 30.9 30.0 - 36.0 g/dL   RDW 12.4 11.5 - 15.5 %   Platelets 268 150 - 400 K/uL   nRBC 0.0 0.0 - 0.2 %  Comprehensive metabolic panel  Result Value Ref Range   Sodium 141 135 - 145 mmol/L   Potassium 3.8 3.5 - 5.1 mmol/L   Chloride 105 98 - 111 mmol/L   CO2 28 22 - 32 mmol/L   Glucose, Bld 186 (H) 70 - 99 mg/dL   BUN 11 8 - 23 mg/dL   Creatinine, Ser 0.74 0.44 - 1.00 mg/dL   Calcium 8.8 (L) 8.9 - 10.3 mg/dL   Total Protein 7.3 6.5 - 8.1 g/dL   Albumin 3.7 3.5 - 5.0 g/dL   AST 19 15 - 41 U/L   ALT 19 0 - 44 U/L   Alkaline Phosphatase 104 38 - 126 U/L   Total Bilirubin 0.8 0.3 - 1.2 mg/dL   GFR calc non Af Amer >60 >60 mL/min   GFR calc Af Amer >60 >60 mL/min   Anion gap 8 5 - 15   Lab Results  Component Value Date   HGBA1C 6.5 12/27/2018   Lab Results  Component Value Date   CHOL 192 12/27/2018   HDL 55.00 12/27/2018   LDLCALC 119 (H) 12/27/2018   LDLDIRECT 111.0 09/28/2014   TRIG 91.0 12/27/2018   CHOLHDL 3 12/27/2018   EKG at ER 03/01/2019 independently reviewed: NSR 60, normal axis, intervals, no acute ST/T changes, nonspecific T wave flattening lateral leads Assessment & Plan:  This visit occurred during the SARS-CoV-2 public health emergency.  Safety protocols were in place, including screening questions prior to the visit, additional usage of staff PPE, and extensive cleaning of exam room while  observing appropriate contact time as indicated for disinfecting solutions.   Problem List Items Addressed This Visit  Vertigo - Primary    Concerning is ongoing vertigo that has lasted several days, needing scheduled meclizine to manage. Duration not consistent with BPPV, story not consistent vestibular neuritis or labrynthitis or other peripheral cause. Will further evaluate for central causes - prudent to check MRI r/o small brainstem or cerebellar stroke. If normal, will need further evaluation for possible meniere's - as she endorses h/o vertigo in the past (I was unaware) but not as severe as recent episode.  Stroke risk factors include HLD, DM, and h/o afib.       Right shoulder pain    Ongoing R shoulder pain, reassuring exam and xray in ER. Await neurological eval with brain MR.       Hyperlipidemia associated with type 2 diabetes mellitus ([place])    LDL was above goal but she has subsequently restarted lipitor. Will need rechecked next labwork.  The 10-year ASCVD risk score Mikey Bussing DC Brooke Bonito., et al., 2013) is: 22.1%   Values used to calculate the score:     Age: 54 years     Sex: Female     Is Non-Hispanic African American: Yes     Diabetic: Yes     Tobacco smoker: No     Systolic Blood Pressure: Q000111Q mmHg     Is BP treated: No     HDL Cholesterol: 55 mg/dL     Total Cholesterol: 192 mg/dL       Controlled type 2 diabetes mellitus without complication, without long-term current use of insulin (H[facility])    Largely controlled on metformin.        Other Visit Diagnoses    Vertigo of central origin       Relevant Orders   MR Brain Wo Contrast       No orders of the defined types were placed in this encounter.  Orders Placed This Encounter  Procedures  . MR Brain Wo Contrast    Standing Status:   Future    Standing Expiration Date:   05/01/2020    Order Specific Question:   ** REASON FOR EXAM (FREE TEXT)    Answer:   ongoing vertigo with headache and R ear tinnitus    Order  Specific Question:   What is the patient's sedation requirement?    Answer:   No Sedation    Order Specific Question:   Does the patient have a pacemaker or implanted devices?    Answer:   No    Order Specific Question:   Preferred imaging location?    Answer:   [facility] (table limit-400lbs)    Order Specific Question:   Radiology Contrast Protocol - do NOT remove file path    Answer:   [email or web address]    Patient Instructions  Strange that you continue to have vertigo 6 days into symptoms.  Try tapering down on meclizine. I recommend head imaging - with brain MRI.  If normal, we will refer you to ENT for further evaluation of vertigo.    Follow up plan: Return if symptoms worsen or fail to improve.  Ria Bush, MD

## 2019-03-05 DIAGNOSIS — R42 Dizziness and giddiness: Secondary | ICD-10-CM | POA: Insufficient documentation

## 2019-03-05 NOTE — Assessment & Plan Note (Signed)
Largely controlled on metformin.

## 2019-03-05 NOTE — Assessment & Plan Note (Signed)
Ongoing R shoulder pain, reassuring exam and xray in ER. Await neurological eval with brain MR.

## 2019-03-05 NOTE — Assessment & Plan Note (Signed)
LDL was above goal but she has subsequently restarted lipitor. Will need rechecked next labwork.  The 10-year ASCVD risk score Mikey Bussing DC Brooke Bonito., et al., 2013) is: 22.1%   Values used to calculate the score:     Age: 69 years     Sex: Female     Is Non-Hispanic African American: Yes     Diabetic: Yes     Tobacco smoker: No     Systolic Blood Pressure: Q000111Q mmHg     Is BP treated: No     HDL Cholesterol: 55 mg/dL     Total Cholesterol: 192 mg/dL

## 2019-03-05 NOTE — Assessment & Plan Note (Signed)
Concerning is ongoing vertigo that has lasted several days, needing scheduled meclizine to manage. Duration not consistent with BPPV, story not consistent vestibular neuritis or labrynthitis or other peripheral cause. Will further evaluate for central causes - prudent to check MRI r/o small brainstem or cerebellar stroke. If normal, will need further evaluation for possible meniere's - as she endorses h/o vertigo in the past (I was unaware) but not as severe as recent episode.  Stroke risk factors include HLD, DM, and h/o afib.

## 2019-03-15 ENCOUNTER — Ambulatory Visit: Admission: RE | Admit: 2019-03-15 | Payer: Medicare Other | Source: Ambulatory Visit

## 2019-05-03 ENCOUNTER — Telehealth: Payer: Self-pay | Admitting: Obstetrics and Gynecology

## 2019-05-03 NOTE — Telephone Encounter (Signed)
Pt called in and stated that she was told by her friend that we have an great weight loss program. The pt was wanting to know if her insurance would cover it\. The pt has humma insurance. The pt will be a new gyn. Pt is requesting a call back. Please advise

## 2019-05-04 ENCOUNTER — Telehealth: Payer: Self-pay | Admitting: Obstetrics and Gynecology

## 2019-05-04 NOTE — Telephone Encounter (Signed)
Patient called in to follow up about wanting to make an appointment with [place] but was wanting to know if we accept her insurance.

## 2019-05-04 NOTE — Telephone Encounter (Signed)
Hello Brandy Guerrero, We do accept medicare part a and b. Will you please contact the pt and schedule her with a provider? Thanks [employer]

## 2019-05-04 NOTE — Telephone Encounter (Signed)
Called patient. Went to [employer]- box was full.

## 2019-05-05 NOTE — Telephone Encounter (Signed)
error 

## 2019-05-06 NOTE — Telephone Encounter (Signed)
N/a. VM full. Unable to leave a message.

## 2019-05-09 ENCOUNTER — Telehealth: Payer: Self-pay | Admitting: Obstetrics and Gynecology

## 2019-05-09 NOTE — Telephone Encounter (Signed)
N/a. VM full. Unable to leave a message.  Called x 3.   Chart closed.

## 2019-05-09 NOTE — Telephone Encounter (Signed)
Pt called and stated that she didn't know

## 2019-05-12 ENCOUNTER — Encounter: Payer: Self-pay | Admitting: Obstetrics and Gynecology

## 2019-05-12 ENCOUNTER — Other Ambulatory Visit: Payer: Self-pay

## 2019-05-12 ENCOUNTER — Ambulatory Visit (INDEPENDENT_AMBULATORY_CARE_PROVIDER_SITE_OTHER): Payer: Medicare HMO | Admitting: Obstetrics and Gynecology

## 2019-05-12 VITALS — BP 112/68 | HR 73 | Ht 63.0 in | Wt 196.8 lb

## 2019-05-12 DIAGNOSIS — Z713 Dietary counseling and surveillance: Secondary | ICD-10-CM | POA: Diagnosis not present

## 2019-05-12 DIAGNOSIS — R638 Other symptoms and signs concerning food and fluid intake: Secondary | ICD-10-CM | POA: Diagnosis not present

## 2019-05-12 MED ORDER — PHENTERMINE HCL 37.5 MG PO TABS: 37.5000 mg | ORAL_TABLET | Freq: Every day | ORAL | 0 refills | Status: DC

## 2019-05-12 NOTE — Progress Notes (Signed)
HPI:      [name] is a 69 y.o. G2P2 who LMP was No LMP recorded. Patient has had a hysterectomy.  Subjective:   Brandy Guerrero presents today to discuss weight loss.  Brandy Guerrero has tried various methods over the years including weight watchers, nutritional/dietary supplements etc. her goal is to lose approximately 40 to 50 pounds. Brandy Guerrero is not exercising regularly at this time. Of significant note, patient states that Brandy Guerrero does not take her metoprolol regularly.  The last time Brandy Guerrero used it was around Christmas.  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    Hx: The following portions of the patient's history were reviewed and updated as appropriate:             Brandy Guerrero  has a past medical history of Acne, Anemia, History of atrial fibrillation (05/2005), HLD (hyperlipidemia), Obesity, Panic attacks, Pelvic mass, Positive occult stool blood test (2014), and Prediabetes. Brandy Guerrero does not have any pertinent problems on file. Brandy Guerrero  has a past surgical history that includes Colonoscopy (01/2012); Breast biopsy; and Abdominal hysterectomy (1990s). Her family history includes Alcohol abuse in her brother and father; Diabetes in her daughter; Hypertension in her mother. Brandy Guerrero  reports that Brandy Guerrero has never smoked. Brandy Guerrero has never used smokeless tobacco. Brandy Guerrero reports current alcohol use. Brandy Guerrero reports that Brandy Guerrero does not use drugs. Brandy Guerrero has a current medication list which includes the following prescription(s): atorvastatin, metformin, metoprolol tartrate, ondansetron, meclizine, and phentermine. Brandy Guerrero is allergic to sulfa antibiotics.       Review of Systems:  Review of Systems  Constitutional: Denied constitutional symptoms, night sweats, recent illness, fatigue, fever, insomnia and weight loss.  Eyes: Denied eye symptoms, eye pain, photophobia, vision change and visual disturbance.  Ears/Nose/Throat/Neck: Denied ear, nose, throat or neck symptoms, hearing loss, nasal discharge, sinus congestion and sore  throat.  Cardiovascular: Denied cardiovascular symptoms, arrhythmia, chest pain/pressure, edema, exercise intolerance, orthopnea and palpitations.  Respiratory: Denied pulmonary symptoms, asthma, pleuritic pain, productive sputum, cough, dyspnea and wheezing.  Gastrointestinal: Denied, gastro-esophageal reflux, melena, nausea and vomiting.  Genitourinary: Denied genitourinary symptoms including symptomatic vaginal discharge, pelvic relaxation issues, and urinary complaints.  Musculoskeletal: Denied musculoskeletal symptoms, stiffness, swelling, muscle weakness and myalgia.  Dermatologic: Denied dermatology symptoms, rash and scar.  Neurologic: Denied neurology symptoms, dizziness, headache, neck pain and syncope.  Psychiatric: Denied psychiatric symptoms, anxiety and depression.  Endocrine: Denied endocrine symptoms including hot flashes and night sweats.   Meds:   Current Outpatient Medications on File Prior to Visit  Medication Sig Dispense Refill  . atorvastatin (LIPITOR) 20 MG tablet Take 1 tablet (20 mg total) by mouth daily. 90 tablet 3  . metFORMIN (GLUCOPHAGE) 500 MG tablet Take 1 tablet (500 mg total) by mouth daily with breakfast. 90 tablet 3  . metoprolol tartrate (LOPRESSOR) 25 MG tablet Take 1 tablet (25 mg total) by mouth 2 (two) times daily as needed. Heart fluttering 60 tablet 3  . ondansetron (ZOFRAN ODT) 4 MG disintegrating tablet Take 1 tablet (4 mg total) by mouth every 8 (eight) hours as needed for nausea or vomiting. 12 tablet 0  . meclizine (ANTIVERT) 25 MG tablet Take 1 tablet (25 mg total) by mouth 3 (three) times daily as needed for dizziness. (Patient not taking: Reported on 05/12/2019) 30 tablet 0   No current facility-administered medications on file prior to visit.    Objective:     Vitals:   05/12/19 1017  BP: 112/68  Pulse: 73  Assessment:    G2P2 Patient Active Problem List   Diagnosis Date Noted  . Vertigo 03/05/2019  . Insomnia  09/24/2018  . Advanced care planning/counseling discussion 10/28/2017  . Atypical ductal hyperplasia of left breast 06/17/2016  . Right shoulder pain 03/31/2013  . Encounter for routine adult medical exam with abnormal findings 10/21/2011  . Controlled type 2 diabetes mellitus without complication, without long-term current use of insulin ([place]) 10/21/2011  . Palpitations 09/17/2011  . Umbilical hernia [id number]  . Obesity, Class I, BMI 30-34.9 09/17/2011  . Panic attacks   . Hyperlipidemia associated with type 2 diabetes mellitus ([place])   . Acne   . Pelvic mass in female      1. Increased BMI   2. Weight loss counseling, encounter for        Plan:            1.  Discussed the long-term aspect of weight loss and the necessity of lifestyle change involved in losing weight over the course of multiple months to years.  2.  Necessity of diet and exercise in combination discussed.  This includes a discussion regarding counting calories and portion sizes as well as exercising 5 times per week.  The concept of calories in versus calories burned and weight loss discussed.  3.  Usefulness of losing weight with other family members or weight watchers discussed.  4.  Use of medications like phentermine discussed.  These medications have the ability to help with weight loss but still require dietary modification and exercise.  5.  Expectation of weight loss monthly discussed.  If patient does not meet expectations a possible break from phentermine followed by restart or simply discontinuation of phentermine discussed.  6.  Have advised the patient not to use metoprolol while using phentermine and Brandy Guerrero says this will not be a problem because Brandy Guerrero rarely uses the metoprolol as it is.  Orders No orders of the defined types were placed in this encounter.    Meds ordered this encounter  Medications  . phentermine (ADIPEX-P) 37.5 MG tablet    Sig: Take 1 tablet (37.5 mg total) by mouth daily before  breakfast. 1/2 tab for 2 weeks with breakfast then full tab for 2 weeks    Dispense:  30 tablet    Refill:  0      F/U  Return in about 4 weeks (around 06/09/2019). I spent 33 minutes involved in the care of this patient preparing to see the patient by obtaining and reviewing her medical history (including labs, imaging tests and prior procedures), documenting clinical information in the electronic health record (EHR), counseling and coordinating care plans, writing and sending prescriptions, ordering tests or procedures and directly communicating with the patient by discussing pertinent items from her history and physical exam as well as detailing my assessment and plan as noted above so that Brandy Guerrero has an informed understanding.  All of her questions were answered.  Finis Bud, M.D. 05/12/2019 10:55 AM

## 2019-06-09 ENCOUNTER — Encounter: Payer: Medicare HMO | Admitting: Obstetrics and Gynecology

## 2019-06-16 ENCOUNTER — Ambulatory Visit (INDEPENDENT_AMBULATORY_CARE_PROVIDER_SITE_OTHER): Payer: Medicare HMO | Admitting: Obstetrics and Gynecology

## 2019-06-16 ENCOUNTER — Other Ambulatory Visit: Payer: Self-pay

## 2019-06-16 ENCOUNTER — Encounter: Payer: Self-pay | Admitting: Obstetrics and Gynecology

## 2019-06-16 VITALS — BP 124/68 | HR 75 | Temp 98.8°F | Wt 194.6 lb

## 2019-06-16 DIAGNOSIS — Z713 Dietary counseling and surveillance: Secondary | ICD-10-CM | POA: Diagnosis not present

## 2019-06-16 DIAGNOSIS — R638 Other symptoms and signs concerning food and fluid intake: Secondary | ICD-10-CM

## 2019-06-16 NOTE — Progress Notes (Signed)
HPI:      [name] is a 69 y.o. G2P2 who LMP was No LMP recorded. Patient has had a hysterectomy.  Subjective:   She presents today for follow-up of weight loss using phentermine.  Patient has lost 2 pounds in the last 4 to 5 weeks.  She states that she is not on a systematic dietary restriction but she has modified a few things.  She does not eat fried foods she is using " [employer]" and has begun using some form of dietary shake. She has not increased her exercise but states that she just got a membership to a gym through her health insurance.    Hx: The following portions of the patient's history were reviewed and updated as appropriate:             She  has a past medical history of Acne, Anemia, History of atrial fibrillation (05/2005), HLD (hyperlipidemia), Obesity, Panic attacks, Pelvic mass, Positive occult stool blood test (2014), and Prediabetes. She does not have any pertinent problems on file. She  has a past surgical history that includes Colonoscopy (01/2012); Breast biopsy; and Abdominal hysterectomy (1990s). Her family history includes Alcohol abuse in her brother and father; Diabetes in her daughter; Hypertension in her mother. She  reports that she has never smoked. She has never used smokeless tobacco. She reports current alcohol use. She reports that she does not use drugs. She has a current medication list which includes the following prescription(s): atorvastatin, metformin, metoprolol tartrate, phentermine, meclizine, and ondansetron. She is allergic to sulfa antibiotics.       Review of Systems:  Review of Systems  Constitutional: Denied constitutional symptoms, night sweats, recent illness, fatigue, fever, insomnia and weight loss.  Eyes: Denied eye symptoms, eye pain, photophobia, vision change and visual disturbance.  Ears/Nose/Throat/Neck: Denied ear, nose, throat or neck symptoms, hearing loss, nasal discharge, sinus congestion and sore throat.   Cardiovascular: Denied cardiovascular symptoms, arrhythmia, chest pain/pressure, edema, exercise intolerance, orthopnea and palpitations.  Respiratory: Denied pulmonary symptoms, asthma, pleuritic pain, productive sputum, cough, dyspnea and wheezing.  Gastrointestinal: Denied, gastro-esophageal reflux, melena, nausea and vomiting.  Genitourinary: Denied genitourinary symptoms including symptomatic vaginal discharge, pelvic relaxation issues, and urinary complaints.  Musculoskeletal: Denied musculoskeletal symptoms, stiffness, swelling, muscle weakness and myalgia.  Dermatologic: Denied dermatology symptoms, rash and scar.  Neurologic: Denied neurology symptoms, dizziness, headache, neck pain and syncope.  Psychiatric: Denied psychiatric symptoms, anxiety and depression.  Endocrine: Denied endocrine symptoms including hot flashes and night sweats.   Meds:   Current Outpatient Medications on File Prior to Visit  Medication Sig Dispense Refill  . atorvastatin (LIPITOR) 20 MG tablet Take 1 tablet (20 mg total) by mouth daily. 90 tablet 3  . metFORMIN (GLUCOPHAGE) 500 MG tablet Take 1 tablet (500 mg total) by mouth daily with breakfast. 90 tablet 3  . metoprolol tartrate (LOPRESSOR) 25 MG tablet Take 1 tablet (25 mg total) by mouth 2 (two) times daily as needed. Heart fluttering 60 tablet 3  . phentermine (ADIPEX-P) 37.5 MG tablet Take 1 tablet (37.5 mg total) by mouth daily before breakfast. 1/2 tab for 2 weeks with breakfast then full tab for 2 weeks 30 tablet 0  . meclizine (ANTIVERT) 25 MG tablet Take 1 tablet (25 mg total) by mouth 3 (three) times daily as needed for dizziness. (Patient not taking: Reported on 05/12/2019) 30 tablet 0  . ondansetron (ZOFRAN ODT) 4 MG disintegrating tablet Take 1 tablet (4 mg total) by mouth  every 8 (eight) hours as needed for nausea or vomiting. (Patient not taking: Reported on 06/16/2019) 12 tablet 0   No current facility-administered medications on file prior to  visit.    Objective:     Vitals:   06/16/19 0837  BP: 124/68  Pulse: 75  Temp: 98.8 F (37.1 C)                Assessment:    G2P2 Patient Active Problem List   Diagnosis Date Noted  . Vertigo 03/05/2019  . Insomnia 09/24/2018  . Advanced care planning/counseling discussion 10/28/2017  . Atypical ductal hyperplasia of left breast 06/17/2016  . Right shoulder pain 03/31/2013  . Encounter for routine adult medical exam with abnormal findings 10/21/2011  . Controlled type 2 diabetes mellitus without complication, without long-term current use of insulin ([place]) 10/21/2011  . Palpitations 09/17/2011  . Umbilical hernia [id number]  . Obesity, Class I, BMI 30-34.9 09/17/2011  . Panic attacks   . Hyperlipidemia associated with type 2 diabetes mellitus ([place])   . Acne   . Pelvic mass in female      1. Increased BMI   2. Weight loss counseling, encounter for     Patient with very minimal weight loss despite use of phentermine.  Likely not consistent with diet and exercise as previously discussed.   Plan:            1.  We have discussed this in some detail and I have recommended that she go to a specific weight loss program where they will follow her on a much closer basis and have a larger armamentarium.  Have suggested see [facility] center in [place] declined.  Discussed other weight loss centers and she declined these as well.  She says she will continue to modify her diet and attend her gym. Orders No orders of the defined types were placed in this encounter.   No orders of the defined types were placed in this encounter.     F/U  No follow-ups on file. I spent 13 minutes involved in the care of this patient preparing to see the patient by obtaining and reviewing her medical history (including labs, imaging tests and prior procedures), documenting clinical information in the electronic health record (EHR), counseling and coordinating care plans,  writing and sending prescriptions, ordering tests or procedures and directly communicating with the patient by discussing pertinent items from her history and physical exam as well as detailing my assessment and plan as noted above so that she has an informed understanding.  All of her questions were answered.  Finis Bud, M.D. 06/16/2019 8:56 AM

## 2019-06-20 ENCOUNTER — Ambulatory Visit: Payer: Commercial Managed Care - PPO | Admitting: Family Medicine

## 2019-07-08 ENCOUNTER — Ambulatory Visit: Payer: Medicare HMO | Admitting: Family Medicine

## 2019-07-12 ENCOUNTER — Other Ambulatory Visit: Payer: Self-pay

## 2019-07-12 ENCOUNTER — Encounter: Payer: Self-pay | Admitting: Family Medicine

## 2019-07-12 ENCOUNTER — Ambulatory Visit (INDEPENDENT_AMBULATORY_CARE_PROVIDER_SITE_OTHER): Payer: Medicare HMO | Admitting: Family Medicine

## 2019-07-12 VITALS — BP 120/68 | HR 81 | Ht 63.0 in | Wt 195.0 lb

## 2019-07-12 DIAGNOSIS — R42 Dizziness and giddiness: Secondary | ICD-10-CM

## 2019-07-12 MED ORDER — MECLIZINE HCL 25 MG PO TABS: 25.0000 mg | ORAL_TABLET | Freq: Every day | ORAL | 0 refills | Status: DC

## 2019-07-12 NOTE — Progress Notes (Signed)
Chief Complaint  Patient presents with  . Dizziness    History of Present Illness: HPI   69 year old female patient of Dr. Synthia Innocent presents with new onset vertigo.  Seen in ER in 02/2019 for vertigo, room spinning. Neg eval. Treated meclizine, zofran.  Never had recommended MRI.  resolved after few weeks. Was doing well for months.   Now in last week.. vertigo returned. Intermittent.  Mild head pressure, headache. No ear pain, no allergy symptoms. Triggered by  Leaning over, moving head, rolling over in bed.  No headache No new neuro change. No weakness. No nausea. No slurred speech, no memory issues. No vision change.   no head injury. No falls.  BP Readings from Last 3 Encounters:  07/12/19 120/68  06/16/19 124/68  05/12/19 112/68      This visit occurred during the SARS-CoV-2 public health emergency.  Safety protocols were in place, including screening questions prior to the visit, additional usage of staff PPE, and extensive cleaning of exam room while observing appropriate contact time as indicated for disinfecting solutions.   COVID 19 screen:  No recent travel or known exposure to COVID19 The patient denies respiratory symptoms of COVID 19 at this time. The importance of social distancing was discussed today.     Review of Systems  Constitutional: Negative for chills and fever.  HENT: Negative for congestion and ear pain.   Eyes: Negative for pain and redness.  Respiratory: Negative for cough and shortness of breath.   Cardiovascular: Negative for chest pain, palpitations and leg swelling.  Gastrointestinal: Negative for abdominal pain, blood in stool, constipation, diarrhea, nausea and vomiting.  Genitourinary: Negative for dysuria.  Musculoskeletal: Negative for falls and myalgias.  Skin: Negative for rash.  Neurological: Negative for dizziness.  Psychiatric/Behavioral: Negative for depression. The patient is not nervous/anxious.       Past Medical  History:  Diagnosis Date  . Acne    dermatology - on doxy 100mg  daily  . Anemia   . History of atrial fibrillation 05/2005   h/o parox Afib, nl cardiolite/echo, no prolonged episodes.  metoprolol prn [facility])  . HLD (hyperlipidemia)   . Obesity   . Panic attacks   . Pelvic mass    declines resection  . Positive occult stool blood test 2014   s/p normal colonoscopy, rpt stool cards negative  . Prediabetes     reports that she has never smoked. She has never used smokeless tobacco. She reports current alcohol use. She reports that she does not use drugs.   Current Outpatient Medications:  .  atorvastatin (LIPITOR) 20 MG tablet, Take 1 tablet (20 mg total) by mouth daily., Disp: 90 tablet, Rfl: 3 .  meclizine (ANTIVERT) 25 MG tablet, Take 25 mg by mouth as needed for dizziness., Disp: , Rfl:  .  metFORMIN (GLUCOPHAGE) 500 MG tablet, Take 1 tablet (500 mg total) by mouth daily with breakfast., Disp: 90 tablet, Rfl: 3 .  metoprolol tartrate (LOPRESSOR) 25 MG tablet, Take 1 tablet (25 mg total) by mouth 2 (two) times daily as needed. Heart fluttering, Disp: 60 tablet, Rfl: 3   Observations/Objective: Blood pressure 120/68, pulse 81, height 5\' 3"  (1.6 m), weight 195 lb (88.5 kg), SpO2 99 %.  Physical Exam Constitutional:      General: She is not in acute distress.    Appearance: Normal appearance. She is well-developed. She is not ill-appearing or toxic-appearing.  HENT:     Head: Normocephalic.     Right Ear:  Hearing, tympanic membrane, ear canal and external ear normal. Tympanic membrane is not erythematous, retracted or bulging.     Left Ear: Hearing, tympanic membrane, ear canal and external ear normal. Tympanic membrane is not erythematous, retracted or bulging.     Nose: No mucosal edema or rhinorrhea.     Right Sinus: No maxillary sinus tenderness or frontal sinus tenderness.     Left Sinus: No maxillary sinus tenderness or frontal sinus tenderness.     Mouth/Throat:     Pharynx:  Uvula midline.  Eyes:     General: Lids are normal. Lids are everted, no foreign bodies appreciated.     Conjunctiva/sclera: Conjunctivae normal.     Pupils: Pupils are equal, round, and reactive to light.  Neck:     Thyroid: No thyroid mass or thyromegaly.     Vascular: No carotid bruit.     Trachea: Trachea normal.  Cardiovascular:     Rate and Rhythm: Normal rate and regular rhythm.     Pulses: Normal pulses.     Heart sounds: Normal heart sounds, S1 normal and S2 normal. No murmur heard.  No friction rub. No gallop.   Pulmonary:     Effort: Pulmonary effort is normal. No tachypnea or respiratory distress.     Breath sounds: Normal breath sounds. No decreased breath sounds, wheezing, rhonchi or rales.  Abdominal:     General: Bowel sounds are normal.     Palpations: Abdomen is soft.     Tenderness: There is no abdominal tenderness.  Musculoskeletal:     Cervical back: Normal range of motion and neck supple.  Skin:    General: Skin is warm and dry.     Findings: No rash.  Neurological:     Mental Status: She is alert and oriented to person, place, and time.     GCS: GCS eye subscore is 4. GCS verbal subscore is 5. GCS motor subscore is 6.     Cranial Nerves: No cranial nerve deficit.     Sensory: No sensory deficit.     Motor: No abnormal muscle tone.     Coordination: Coordination normal.     Gait: Gait normal.     Deep Tendon Reflexes: Reflexes are normal and symmetric.     Comments: Nml cerebellar exam   No papilledema  Psychiatric:        Mood and Affect: Mood is not anxious or depressed.        Speech: Speech normal.        Behavior: Behavior normal. Behavior is cooperative.        Thought Content: Thought content normal.        Cognition and Memory: Memory is not impaired. She does not exhibit impaired recent memory or impaired remote memory.        Judgment: Judgment normal.      Assessment and Plan Vertigo [facility].  Start home desensitization  exercises 3-4 times a day. Can use meclizine as needed at bedtime for vertigo.  Follow up with PCP next week for possible imaging.        Eliezer Lofts, MD

## 2019-07-12 NOTE — Patient Instructions (Signed)
Start home desensitization exercises 3-4 times a day. Can use meclizine as needed at bedtime for vertigo.  Follow up with PCP next week for possible imaging.  Benign Positional Vertigo Vertigo is the feeling that you or your surroundings are moving when they are not. Benign positional vertigo is the most common form of vertigo. This is usually a harmless condition (benign). This condition is positional. This means that symptoms are triggered by certain movements and positions. This condition can be dangerous if it occurs while you are doing something that could cause harm to you or others. This includes activities such as driving or operating machinery. What are the causes? In many cases, the cause of this condition is not known. It may be caused by a disturbance in an area of the inner ear that helps your brain to sense movement and balance. This disturbance can be caused by:  Viral infection (labyrinthitis).  Head injury.  Repetitive motion, such as jumping, dancing, or running. What increases the risk? You are more likely to develop this condition if:  You are a woman.  You are 27 years of age or older. What are the signs or symptoms? Symptoms of this condition usually happen when you move your head or your eyes in different directions. Symptoms may start suddenly, and usually last for less than a minute. They include:  Loss of balance and falling.  Feeling like you are spinning or moving.  Feeling like your surroundings are spinning or moving.  Nausea and vomiting.  Blurred vision.  Dizziness.  Involuntary eye movement (nystagmus). Symptoms can be mild and cause only minor problems, or they can be severe and interfere with daily life. Episodes of benign positional vertigo may return (recur) over time. Symptoms may improve over time. How is this diagnosed? This condition may be diagnosed based on:  Your medical history.  Physical exam of the head, neck, and  ears.  Tests, such as: ? MRI. ? CT scan. ? Eye movement tests. Your health care provider may ask you to change positions quickly while he or she watches you for symptoms of benign positional vertigo, such as nystagmus. Eye movement may be tested with a variety of exams that are designed to evaluate or stimulate vertigo. ? An electroencephalogram (EEG). This records electrical activity in your brain. ? Hearing tests. You may be referred to a health care provider who specializes in ear, nose, and throat (ENT) problems (otolaryngologist) or a provider who specializes in disorders of the nervous system (neurologist). How is this treated?  This condition may be treated in a session in which your health care provider moves your head in specific positions to adjust your inner ear back to normal. Treatment for this condition may take several sessions. Surgery may be needed in severe cases, but this is rare. In some cases, benign positional vertigo may resolve on its own in 2-4 weeks. Follow these instructions at home: Safety  Move slowly. Avoid sudden body or head movements or certain positions, as told by your health care provider.  Avoid driving until your health care provider says it is safe for you to do so.  Avoid operating heavy machinery until your health care provider says it is safe for you to do so.  Avoid doing any tasks that would be dangerous to you or others if vertigo occurs.  If you have trouble walking or keeping your balance, try using a cane for stability. If you feel dizzy or unstable, sit down right away.  Return to your normal activities as told by your health care provider. Ask your health care provider what activities are safe for you. General instructions  Take over-the-counter and prescription medicines only as told by your health care provider.  Drink enough fluid to keep your urine pale yellow.  Keep all follow-up visits as told by your health care provider. This  is important. Contact a health care provider if:  You have a fever.  Your condition gets worse or you develop new symptoms.  Your family or friends notice any behavioral changes.  You have nausea or vomiting that gets worse.  You have numbness or a "pins and needles" sensation. Get help right away if you:  Have difficulty speaking or moving.  Are always dizzy.  Faint.  Develop severe headaches.  Have weakness in your legs or arms.  Have changes in your hearing or vision.  Develop a stiff neck.  Develop sensitivity to light. Summary  Vertigo is the feeling that you or your surroundings are moving when they are not. Benign positional vertigo is the most common form of vertigo.  The cause of this condition is not known. It may be caused by a disturbance in an area of the inner ear that helps your brain to sense movement and balance.  Symptoms include loss of balance and falling, feeling that you or your surroundings are moving, nausea and vomiting, and blurred vision.  This condition can be diagnosed based on symptoms, physical exam, and other tests, such as MRI, CT scan, eye movement tests, and hearing tests.  Follow safety instructions as told by your health care provider. You will also be told when to contact your health care provider in case of problems. This information is not intended to replace advice given to you by your health care provider. Make sure you discuss any questions you have with your health care provider. Document Revised: 07/08/2017 Document Reviewed: 07/08/2017 Elsevier Patient Education  [employer].

## 2019-07-20 ENCOUNTER — Ambulatory Visit (INDEPENDENT_AMBULATORY_CARE_PROVIDER_SITE_OTHER): Payer: Medicare HMO | Admitting: Family Medicine

## 2019-07-20 ENCOUNTER — Encounter: Payer: Self-pay | Admitting: Family Medicine

## 2019-07-20 ENCOUNTER — Other Ambulatory Visit: Payer: Self-pay

## 2019-07-20 VITALS — BP 126/72 | HR 96 | Temp 97.9°F | Wt 193.2 lb

## 2019-07-20 DIAGNOSIS — H6122 Impacted cerumen, left ear: Secondary | ICD-10-CM | POA: Diagnosis not present

## 2019-07-20 DIAGNOSIS — E669 Obesity, unspecified: Secondary | ICD-10-CM | POA: Diagnosis not present

## 2019-07-20 DIAGNOSIS — E119 Type 2 diabetes mellitus without complications: Secondary | ICD-10-CM | POA: Diagnosis not present

## 2019-07-20 DIAGNOSIS — R42 Dizziness and giddiness: Secondary | ICD-10-CM

## 2019-07-20 DIAGNOSIS — E1169 Type 2 diabetes mellitus with other specified complication: Secondary | ICD-10-CM

## 2019-07-20 DIAGNOSIS — H6120 Impacted cerumen, unspecified ear: Secondary | ICD-10-CM | POA: Insufficient documentation

## 2019-07-20 DIAGNOSIS — E785 Hyperlipidemia, unspecified: Secondary | ICD-10-CM | POA: Diagnosis not present

## 2019-07-20 LAB — HEMOGLOBIN A1C: Hgb A1c MFr Bld: 6.7 % — ABNORMAL HIGH (ref 4.6–6.5)

## 2019-07-20 LAB — BASIC METABOLIC PANEL
BUN: 17 mg/dL (ref 6–23)
CO2: 29 mEq/L (ref 19–32)
Calcium: 9.4 mg/dL (ref 8.4–10.5)
Chloride: 104 mEq/L (ref 96–112)
Creatinine, Ser: 0.83 mg/dL (ref 0.40–1.20)
GFR: 82.8 mL/min (ref >60.00)
Glucose, Bld: 155 mg/dL — ABNORMAL HIGH (ref 70–99)
Potassium: 4.3 mEq/L (ref 3.5–5.1)
Sodium: 138 mEq/L (ref 135–145)

## 2019-07-20 LAB — LIPID PANEL
Cholesterol: 167 mg/dL (ref 0–200)
HDL: 47 mg/dL (ref >39.00)
LDL Cholesterol: 111 mg/dL — ABNORMAL HIGH (ref 0–99)
NonHDL: 120.46
Total CHOL/HDL Ratio: 4
Triglycerides: 48 mg/dL (ref 0.0–149.0)
VLDL: 9.6 mg/dL (ref 0.0–40.0)

## 2019-07-20 MED ORDER — CONTRAVE 8-90 MG PO TB12: ORAL_TABLET | ORAL | 0 refills | Status: DC

## 2019-07-20 NOTE — Patient Instructions (Addendum)
Labs today Take advantage of silver sneakers program Price out contrave weight loss medicine Schedule 1 month follow up visit after starting.  We will refer you to ophthalmology for diabetic eye exam.

## 2019-07-20 NOTE — Assessment & Plan Note (Signed)
Chronic, stable. Foot exam today. Continue metformin (occasional missed doses).

## 2019-07-20 NOTE — Assessment & Plan Note (Signed)
Encouraged healthy diet and lifestyle. Discussed bariatric options. Don't recommend phentermine in afib history. She is not interested in injectable medication. Will price out contrave. Reviewed mechanism of action of medication and side effecst to watch for including need to monitor for suicidality with wellbutrin use. RTC 1 mo f/u visit.

## 2019-07-20 NOTE — Progress Notes (Signed)
This visit was conducted in person.  BP 126/72 (BP Location: Left Arm, Patient Position: Sitting, Cuff Size: Normal)   Pulse 96   Temp 97.9 F (36.6 C) (Temporal)   Wt 193 lb 3.2 oz (87.6 kg)   SpO2 98%   BMI 34.22 kg/m    CC: 45mo DM f/u visit  Subjective:    Patient ID: Brandy Guerrero, female    DOB: [birth year], 69 y.o.   MRN: [id number]  HPI: Brandy Guerrero is a 69 y.o. female presenting on 07/20/2019 for Follow-up   Completed [place] - will bring [place] dates.   Saw Dr Diona Browner last week with dx BPPV treated with repositioning maneuvers with benefit, rec f/u in office today to discuss possible need for brain imaging. Vertigo ongoing since 02/2019 at that time she cancelled ordered MRI due to insurance concerns. Stroke risk factors include HLD, DM, and h/o afib. Meclizine refilled.   DM - well controlled on metformin 500mg  daily. Foot exam today. Due for eye exam.  Lab Results  Component Value Date   HGBA1C 6.5 12/27/2018    Diabetic Foot Exam - Simple   Simple Foot Form Diabetic Foot exam was performed with the following findings: Yes 07/20/2019 10:24 AM  Visual Inspection No deformities, no ulcerations, no other skin breakdown bilaterally: Yes Sensation Testing Intact to touch and monofilament testing bilaterally: Yes Pulse Check Posterior Tibialis and Dorsalis pulse intact bilaterally: Yes Comments      Previously on phentermine through OBGYN (Dr Amalia Hailey) - stopped due to poor weight loss effect. Also on herbalife shakes. Overall healthy diet, poor water intake, eating fruits/vegetables daily, trying to limit carbs. No regular exercise besides work.      Relevant past medical, surgical, family and social history reviewed and updated as indicated. Interim medical history since our last visit reviewed. Allergies and medications reviewed and updated. Outpatient Medications Prior to Visit  Medication Sig Dispense Refill  . atorvastatin (LIPITOR) 20 MG tablet  Take 1 tablet (20 mg total) by mouth daily. 90 tablet 3  . metFORMIN (GLUCOPHAGE) 500 MG tablet Take 1 tablet (500 mg total) by mouth daily with breakfast. 90 tablet 3  . metoprolol tartrate (LOPRESSOR) 25 MG tablet Take 1 tablet (25 mg total) by mouth 2 (two) times daily as needed. Heart fluttering 60 tablet 3  . meclizine (ANTIVERT) 25 MG tablet Take 1 tablet (25 mg total) by mouth at bedtime. (Patient not taking: Reported on 07/20/2019) 30 tablet 0   No facility-administered medications prior to visit.     Per HPI unless specifically indicated in ROS section below Review of Systems Objective:  BP 126/72 (BP Location: Left Arm, Patient Position: Sitting, Cuff Size: Normal)   Pulse 96   Temp 97.9 F (36.6 C) (Temporal)   Wt 193 lb 3.2 oz (87.6 kg)   SpO2 98%   BMI 34.22 kg/m   Wt Readings from Last 3 Encounters:  07/20/19 193 lb 3.2 oz (87.6 kg)  07/12/19 195 lb (88.5 kg)  06/16/19 194 lb 9.6 oz (88.3 kg)      Physical Exam Vitals and nursing note reviewed.  Constitutional:      General: She is not in acute distress.    Appearance: Normal appearance. She is well-developed. She is not ill-appearing.  HENT:     Right Ear: Hearing, tympanic membrane, ear canal and external ear normal.     Left Ear: Hearing, tympanic membrane and external ear normal. There is impacted cerumen.  Ears:     Comments: L cerumen impaction removed with plastic curette, scratch of canal with scant bleeding that quickly resolved, pt tolerated well. Small amt of hard wax at deep canal was unable to be removed  Eyes:     General: No scleral icterus.    Extraocular Movements: Extraocular movements intact.     Conjunctiva/sclera: Conjunctivae normal.     Pupils: Pupils are equal, round, and reactive to light.  Cardiovascular:     Rate and Rhythm: Normal rate and regular rhythm.     Pulses: Normal pulses.     Heart sounds: Normal heart sounds. No murmur.  Pulmonary:     Effort: Pulmonary effort is  normal. No respiratory distress.     Breath sounds: Normal breath sounds. No wheezing, rhonchi or rales.  Musculoskeletal:     Cervical back: Normal range of motion and neck supple.     Right lower leg: No edema.     Left lower leg: No edema.     Comments: See HPI for foot exam if done  Lymphadenopathy:     Cervical: No cervical adenopathy.  Skin:    General: Skin is warm and dry.     Findings: No rash.  Neurological:     Mental Status: She is alert.  Psychiatric:        Mood and Affect: Mood normal.        Behavior: Behavior normal.       Results for orders placed or performed during the hospital encounter of 02/28/19  CBC  Result Value Ref Range   WBC 6.9 4.0 - 10.5 K/uL   RBC 4.29 3.87 - 5.11 MIL/uL   Hemoglobin 12.7 12.0 - 15.0 g/dL   HCT 41.1 36.0 - 46.0 %   MCV 95.8 80.0 - 100.0 fL   MCH 29.6 26.0 - 34.0 pg   MCHC 30.9 30.0 - 36.0 g/dL   RDW 12.4 11.5 - 15.5 %   Platelets 268 150 - 400 K/uL   nRBC 0.0 0.0 - 0.2 %  Comprehensive metabolic panel  Result Value Ref Range   Sodium 141 135 - 145 mmol/L   Potassium 3.8 3.5 - 5.1 mmol/L   Chloride 105 98 - 111 mmol/L   CO2 28 22 - 32 mmol/L   Glucose, Bld 186 (H) 70 - 99 mg/dL   BUN 11 8 - 23 mg/dL   Creatinine, Ser 0.74 0.44 - 1.00 mg/dL   Calcium 8.8 (L) 8.9 - 10.3 mg/dL   Total Protein 7.3 6.5 - 8.1 g/dL   Albumin 3.7 3.5 - 5.0 g/dL   AST 19 15 - 41 U/L   ALT 19 0 - 44 U/L   Alkaline Phosphatase 104 38 - 126 U/L   Total Bilirubin 0.8 0.3 - 1.2 mg/dL   GFR calc non Af Amer >60 >60 mL/min   GFR calc Af Amer >60 >60 mL/min   Anion gap 8 5 - 15   Lab Results  Component Value Date   CHOL 192 12/27/2018   HDL 55.00 12/27/2018   LDLCALC 119 (H) 12/27/2018   LDLDIRECT 111.0 09/28/2014   TRIG 91.0 12/27/2018   CHOLHDL 3 12/27/2018    Assessment & Plan:  This visit occurred during the SARS-CoV-2 public health emergency.  Safety protocols were in place, including screening questions prior to the visit, additional  usage of staff PPE, and extensive cleaning of exam room while observing appropriate contact time as indicated for disinfecting solutions.   Problem List Items  Addressed This Visit    Vertigo - Primary    Latest episode thought BPPV improving with meclizine PRN and repositioning maneuvers at home. Reviewed stroke risk factors currently present. Will defer head imaging at this time. Advised let us know if recurrence.       Obesity, Class I, BMI 30-34.9    Encouraged healthy diet and lifestyle. Discussed bariatric options. Don't recommend phentermine in afib history. She is not interested in injectable medication. Will price out contrave. Reviewed mechanism of action of medication and side effecst to watch for including need to monitor for suicidality with wellbutrin use. RTC 1 mo f/u visit.       Hyperlipidemia associated with type 2 diabetes mellitus ([place])    Update FLP on lipitor.       Relevant Orders   Lipid panel   Controlled type 2 diabetes mellitus without complication, without long-term current use of insulin (HCC)    Chronic, stable. Foot exam today. Continue metformin (occasional missed doses).       Relevant Orders   Hemoglobin G2B   Basic metabolic panel   Ambulatory referral to Ophthalmology   Cerumen impaction    Incomplete L ear wax removed with plastic curette.           Meds ordered this encounter  Medications  . Naltrexone-buPROPion HCl ER (CONTRAVE) 8-90 MG TB12    Sig: Take 1 tablet by mouth daily for 7 days, THEN 1 tablet in the morning and at bedtime. Start 1 tablet every morning for 7 days, then 1 tablet twice daily for 7 days, then 2 tablets every morning and one every evening.    Dispense:  60 tablet    Refill:  0   Orders Placed This Encounter  Procedures  . Lipid panel  . Hemoglobin A1c  . Basic metabolic panel  . Ambulatory referral to Ophthalmology    Referral Priority:   Routine    Referral Type:   Consultation    Referral Reason:   Specialty  Services Required    Requested Specialty:   Ophthalmology    Number of Visits Requested:   1    Patient Instructions  Labs today Take advantage of silver sneakers program Price out contrave weight loss medicine Schedule 1 month follow up visit after starting.  We will refer you to ophthalmology for diabetic eye exam.    Follow up plan: Return in about 4 weeks (around 08/17/2019), or if symptoms worsen or fail to improve, for follow up visit.  Ria Bush, MD

## 2019-07-20 NOTE — Assessment & Plan Note (Signed)
Update FLP on lipitor.

## 2019-07-20 NOTE — Assessment & Plan Note (Signed)
Incomplete L ear wax removed with plastic curette.

## 2019-07-20 NOTE — Assessment & Plan Note (Addendum)
Latest episode thought BPPV improving with meclizine PRN and repositioning maneuvers at home. Reviewed stroke risk factors currently present. Will defer head imaging at this time. Advised let us know if recurrence.

## 2019-07-22 ENCOUNTER — Telehealth: Payer: Self-pay

## 2019-07-22 ENCOUNTER — Other Ambulatory Visit: Payer: Self-pay | Admitting: Family Medicine

## 2019-07-22 MED ORDER — ATORVASTATIN CALCIUM 40 MG PO TABS: 40.0000 mg | ORAL_TABLET | Freq: Every day | ORAL | 1 refills | Status: DC

## 2019-07-22 NOTE — Telephone Encounter (Signed)
-----   Message from Ria Bush, MD sent at 07/22/2019  6:39 AM EDT ----- Plz notify labs overall ok - kidneys normal. Sugar a bit hight with A1c 6.7% (still diet controlled diabetes). LDL bad cholesterol levels too high for diabetes - recommend we increase lipitor to 40mg  daily - new dose sent to pharmacy.

## 2019-07-22 NOTE — Telephone Encounter (Signed)
Attempted to reach patient regarding lab results and recommendations, no answer and unable to leave message.

## 2019-07-25 ENCOUNTER — Telehealth: Payer: Self-pay

## 2019-07-25 NOTE — Telephone Encounter (Signed)
Mailbox full and unable to leave message

## 2019-07-25 NOTE — Telephone Encounter (Signed)
-----   Message from Ria Bush, MD sent at 07/22/2019  6:39 AM EDT ----- Plz notify labs overall ok - kidneys normal. Sugar a bit hight with A1c 6.7% (still diet controlled diabetes). LDL bad cholesterol levels too high for diabetes - recommend we increase lipitor to 40mg  daily - new dose sent to pharmacy.

## 2019-07-25 NOTE — Telephone Encounter (Signed)
Patient returned call in regards to her lab results

## 2019-07-25 NOTE — Telephone Encounter (Signed)
Patient informed and verbalized understanding.  She will pick up the new medication.

## 2019-07-26 ENCOUNTER — Telehealth: Payer: Self-pay | Admitting: Family Medicine

## 2019-07-26 MED ORDER — CONTRAVE 8-90 MG PO TB12: ORAL_TABLET | ORAL | 0 refills | Status: AC

## 2019-07-26 NOTE — Telephone Encounter (Signed)
Spoke with pt notifying her a new rx was sent.  Verbalizes understanding.

## 2019-07-26 NOTE — Telephone Encounter (Signed)
New Rx sent in

## 2019-07-26 NOTE — Telephone Encounter (Signed)
Patient called.  Patient said Brandy Guerrero needs directions on patient's Contrave. Patient wants to pick up all of her prescriptions tomorrow.  Patient's requesting the instructions be called in to [facility] as soon as possible.

## 2019-08-18 ENCOUNTER — Ambulatory Visit: Payer: Medicare HMO | Admitting: Family Medicine

## 2019-09-02 NOTE — Assessment & Plan Note (Signed)
[place] BPPV.  Start home desensitization exercises 3-4 times a day. Can use meclizine as needed at bedtime for vertigo.  Follow up with PCP next week for possible imaging.

## 2019-09-07 DIAGNOSIS — H40002 Preglaucoma, unspecified, left eye: Secondary | ICD-10-CM | POA: Diagnosis not present

## 2019-09-07 LAB — HM DIABETES EYE EXAM

## 2019-09-22 DIAGNOSIS — H40002 Preglaucoma, unspecified, left eye: Secondary | ICD-10-CM | POA: Diagnosis not present

## 2019-12-02 DIAGNOSIS — H40002 Preglaucoma, unspecified, left eye: Secondary | ICD-10-CM | POA: Diagnosis not present

## 2020-02-21 ENCOUNTER — Telehealth: Payer: Self-pay | Admitting: Family Medicine

## 2020-02-21 NOTE — Telephone Encounter (Signed)
Pt states she is starting a new job with [employer]. They are needing her to do a drug test and TB test. She does not want to go to employee health and is wanting to do this here. I let her know that we could do the TB test but I was not sure about the drug test. Please advise. She states this has to be completed by 1/18.

## 2020-02-21 NOTE — Telephone Encounter (Signed)
We do not do drug screens for employers.

## 2020-02-21 NOTE — Telephone Encounter (Signed)
Do we do drug screenings for employment?

## 2020-02-22 NOTE — Telephone Encounter (Signed)
Left message on vm per dpr relaying Dr. G's message.  

## 2020-03-08 ENCOUNTER — Other Ambulatory Visit: Payer: Self-pay

## 2020-03-08 ENCOUNTER — Emergency Department
Admission: EM | Admit: 2020-03-08 | Discharge: 2020-03-08 | Disposition: A | Discharge status: Home / Self Care | Payer: Medicare HMO | Attending: Emergency Medicine | Admitting: Emergency Medicine

## 2020-03-08 ENCOUNTER — Encounter: Payer: Self-pay | Admitting: Emergency Medicine

## 2020-03-08 DIAGNOSIS — E119 Type 2 diabetes mellitus without complications: Secondary | ICD-10-CM | POA: Diagnosis not present

## 2020-03-08 DIAGNOSIS — Z7984 Long term (current) use of oral hypoglycemic drugs: Secondary | ICD-10-CM | POA: Diagnosis not present

## 2020-03-08 DIAGNOSIS — Z20822 Contact with and (suspected) exposure to covid-19: Secondary | ICD-10-CM | POA: Diagnosis not present

## 2020-03-08 DIAGNOSIS — Z79899 Other long term (current) drug therapy: Secondary | ICD-10-CM | POA: Insufficient documentation

## 2020-03-08 DIAGNOSIS — R42 Dizziness and giddiness: Secondary | ICD-10-CM | POA: Diagnosis not present

## 2020-03-08 DIAGNOSIS — M7918 Myalgia, other site: Secondary | ICD-10-CM | POA: Diagnosis present

## 2020-03-08 DIAGNOSIS — R112 Nausea with vomiting, unspecified: Secondary | ICD-10-CM | POA: Diagnosis not present

## 2020-03-08 DIAGNOSIS — B349 Viral infection, unspecified: Secondary | ICD-10-CM

## 2020-03-08 LAB — COMPREHENSIVE METABOLIC PANEL
ALT: 22 U/L (ref 0–44)
AST: 20 U/L (ref 15–41)
Albumin: 3.7 g/dL (ref 3.5–5.0)
Alkaline Phosphatase: 101 U/L (ref 38–126)
Anion gap: 10 (ref 5–15)
BUN: 12 mg/dL (ref 8–23)
CO2: 28 mmol/L (ref 22–32)
Calcium: 8.5 mg/dL — ABNORMAL LOW (ref 8.9–10.3)
Chloride: 101 mmol/L (ref 98–111)
Creatinine, Ser: 0.62 mg/dL (ref 0.44–1.00)
GFR, Estimated: >60 mL/min (ref >60)
Glucose, Bld: 148 mg/dL — ABNORMAL HIGH (ref 70–99)
Potassium: 3.7 mmol/L (ref 3.5–5.1)
Sodium: 139 mmol/L (ref 135–145)
Total Bilirubin: 0.8 mg/dL (ref 0.3–1.2)
Total Protein: 7.4 g/dL (ref 6.5–8.1)

## 2020-03-08 LAB — CBC WITH DIFFERENTIAL/PLATELET
Abs Immature Granulocytes: 0.03 10*3/uL (ref 0.00–0.07)
Basophils Absolute: 0 10*3/uL (ref 0.0–0.1)
Basophils Relative: 0 %
Eosinophils Absolute: 0.1 10*3/uL (ref 0.0–0.5)
Eosinophils Relative: 1 %
HCT: 39.7 % (ref 36.0–46.0)
Hemoglobin: 12.6 g/dL (ref 12.0–15.0)
Immature Granulocytes: 1 %
Lymphocytes Relative: 21 %
Lymphs Abs: 1.3 10*3/uL (ref 0.7–4.0)
MCH: 30.1 pg (ref 26.0–34.0)
MCHC: 31.7 g/dL (ref 30.0–36.0)
MCV: 95 fL (ref 80.0–100.0)
Monocytes Absolute: 0.6 10*3/uL (ref 0.1–1.0)
Monocytes Relative: 10 %
Neutro Abs: 4.3 10*3/uL (ref 1.7–7.7)
Neutrophils Relative %: 67 %
Platelets: 260 10*3/uL (ref 150–400)
RBC: 4.18 MIL/uL (ref 3.87–5.11)
RDW: 12.4 % (ref 11.5–15.5)
WBC: 6.3 10*3/uL (ref 4.0–10.5)
nRBC: 0 % (ref 0.0–0.2)

## 2020-03-08 LAB — SARS CORONAVIRUS 2 BY RT PCR (HOSPITAL ORDER, PERFORMED IN ~~LOC~~ HOSPITAL LAB): SARS Coronavirus 2: NEGATIVE

## 2020-03-08 LAB — TROPONIN I (HIGH SENSITIVITY): Troponin I (High Sensitivity): 4 ng/L (ref <18)

## 2020-03-08 LAB — LIPASE, BLOOD: Lipase: 25 U/L (ref 11–51)

## 2020-03-08 MED ORDER — ONDANSETRON 4 MG PO TBDP: 4.0000 mg | ORAL_TABLET | Freq: Three times a day (TID) | ORAL | 0 refills | Status: DC | PRN

## 2020-03-08 NOTE — Discharge Instructions (Signed)
Your work-up was reassuring.  Take Tylenol 1 g every 8 hours or ibuprofen 600 with food every 6 hours to help with any discomfort.  Take the Zofran to help with nausea

## 2020-03-08 NOTE — ED Provider Notes (Signed)
[facility] Emergency Department Provider Note  ____________________________________________   Event Date/Time   First MD Initiated Contact with Patient 03/08/20 1241     (approximate)  I have reviewed the triage vital signs and the nursing notes.   HISTORY  Chief Complaint Generalized Body Aches    HPI Brandy Guerrero is a 70 y.o. female with history of atrial fibrillation, anemia who comes in for weakness.  Patient reports that she works here at [employer] in [place] area.  She states that she had a coworker who had a positive COVID contact but still came to work.  She states that she also ate the hospital food yesterday.  She stated that she woke up with nausea and vomiting and muscle aches.  She states that she feels like she has the flu.  Her vomiting is been nonbloody nonbilious.  Has not take anything to help it, intermittent.  Reports muscle aches all over including her abdomen.  No chest pain, no shortness of breath no leg swelling.          Past Medical History:  Diagnosis Date  . Acne    dermatology - on doxy 100mg  daily  . Anemia   . History of atrial fibrillation 05/2005   h/o parox Afib, nl cardiolite/echo, no prolonged episodes.  metoprolol prn [facility])  . HLD (hyperlipidemia)   . Obesity   . Panic attacks   . Pelvic mass    declines resection  . Positive occult stool blood test 2014   s/p normal colonoscopy, rpt stool cards negative  . Prediabetes     Patient Active Problem List   Diagnosis Date Noted  . Cerumen impaction 07/20/2019  . Vertigo 03/05/2019  . Insomnia 09/24/2018  . Advanced care planning/counseling discussion 10/28/2017  . Atypical ductal hyperplasia of left breast 06/17/2016  . Right shoulder pain 03/31/2013  . Encounter for routine adult medical exam with abnormal findings 10/21/2011  . Controlled type 2 diabetes mellitus without complication, without long-term current use of insulin ([place]) 10/21/2011  .  Palpitations 09/17/2011  . Umbilical hernia 38/18/2993  . Obesity, Class I, BMI 30-34.9 09/17/2011  . Panic attacks   . Hyperlipidemia associated with type 2 diabetes mellitus ([place])   . Acne   . Pelvic mass in female     Past Surgical History:  Procedure Laterality Date  . ABDOMINAL HYSTERECTOMY  1990s   for fibroids, has one ovary remaining ([place]). partial  . BREAST BIOPSY    . COLONOSCOPY  01/2012   mod diverticulosis, rec rpt stool cards and if + rec EGD (returned negative), rpt colonoscopy 10 yrs    Prior to Admission medications   Medication Sig Start Date End Date Taking? Authorizing Provider  atorvastatin (LIPITOR) 40 MG tablet Take 1 tablet (40 mg total) by mouth daily. 07/22/19   Ria Bush, MD  meclizine (ANTIVERT) 25 MG tablet Take 1 tablet (25 mg total) by mouth at bedtime. Patient not taking: Reported on 07/20/2019 07/12/19   Jinny Sanders, MD  metFORMIN (GLUCOPHAGE) 500 MG tablet Take 1 tablet (500 mg total) by mouth daily with breakfast. 12/27/18   Ria Bush, MD  metoprolol tartrate (LOPRESSOR) 25 MG tablet Take 1 tablet (25 mg total) by mouth 2 (two) times daily as needed. Heart fluttering 12/27/18 06/03/21  Ria Bush, MD    Allergies Sulfa antibiotics  Family History  Problem Relation Age of Onset  . Hypertension Mother   . Alcohol abuse Brother   . Alcohol  abuse Father   . Diabetes Daughter   . Cancer Neg Hx   . Coronary artery disease Neg Hx   . Stroke Neg Hx     Social History Social History   Tobacco Use  . Smoking status: Never Smoker  . Smokeless tobacco: Never Used  Substance Use Topics  . Alcohol use: Yes    Alcohol/week: 0.0 standard drinks    Comment: Rare  . Drug use: No      Review of Systems Constitutional: No fever/chills Eyes: No visual changes. ENT: No sore throat. Cardiovascular: Denies chest pain. Respiratory: Denies shortness of breath. Gastrointestinal: No abdominal pain.  Positive nausea and  vomiting Genitourinary: Negative for dysuria. Musculoskeletal: Negative for back pain.  Muscle aches Skin: Negative for rash. Neurological: Negative for headaches, focal weakness or numbness. All other ROS negative ____________________________________________   PHYSICAL EXAM:  VITAL SIGNS: ED Triage Vitals  Enc Vitals Group     BP 03/08/20 1026 120/72     Pulse Rate 03/08/20 1026 75     Resp 03/08/20 1026 16     Temp 03/08/20 1024 98.7 F (37.1 C)     Temp Source 03/08/20 1024 Oral     SpO2 03/08/20 1026 97 %     Weight 03/08/20 1023 193 lb 2 oz (87.6 kg)     Height 03/08/20 1023 5\' 3"  (1.6 m)     Head Circumference --      Peak Flow --      Pain Score 03/08/20 1022 8     Pain Loc --      Pain Edu? --      Excl. in [place]? --     Constitutional: Alert and oriented. Well appearing and in no acute distress. Eyes: Conjunctivae are normal. EOMI. Head: Atraumatic. Nose: No congestion/rhinnorhea. Mouth/Throat: Mucous membranes are moist.   Neck: No stridor. Trachea Midline. FROM Cardiovascular: Normal rate, regular rhythm. Grossly normal heart sounds.  Good peripheral circulation. Respiratory: Normal respiratory effort.  No retractions. Lungs CTAB. Gastrointestinal: Soft and nontender. No distention. No abdominal bruits.  Musculoskeletal: No lower extremity tenderness nor edema.  No joint effusions. Neurologic:  Normal speech and language. No gross focal neurologic deficits are appreciated.  Skin:  Skin is warm, dry and intact. No rash noted. Psychiatric: Mood and affect are normal. Speech and behavior are normal. GU: Deferred   ____________________________________________   LABS (all labs ordered are listed, but only abnormal results are displayed)  Labs Reviewed  COMPREHENSIVE METABOLIC PANEL - Abnormal; Notable for the following components:      Result Value   Glucose, Bld 148 (*)    Calcium 8.5 (*)    All other components within normal limits  SARS CORONAVIRUS 2 BY  RT PCR (HOSPITAL ORDER, [place] [facility])  LIPASE, BLOOD  CBC WITH DIFFERENTIAL/PLATELET  URINALYSIS, COMPLETE (UACMP) WITH MICROSCOPIC   ____________________________________________   ED ECG REPORT I, Vanessa [place], the attending physician, personally viewed and interpreted this ECG.  Normal sinus, no ST elevation, no T wave inversions, normal intervals.  There are some areas that actually do caudal look a little ST elevated but it seems to be more related to artifact. ____________________________________________  INITIAL IMPRESSION / ASSESSMENT AND PLAN / ED COURSE  Brandy Guerrero was evaluated in Emergency Department on 03/08/2020 for the symptoms described in the history of present illness. She was evaluated in the context of the global COVID-19 pandemic, which necessitated consideration that the patient might be at  risk for infection with the SARS-CoV-2 virus that causes COVID-19. Institutional protocols and algorithms that pertain to the evaluation of patients at risk for COVID-19 are in a state of rapid change based on information released by regulatory bodies including the CDC and federal and state organizations. These policies and algorithms were followed during the patient's care in the ED.    Patient is a well-appearing 70 year old with normal vital signs who comes in with episode of nausea and vomiting.  She reports achiness all over her body.  When I push on her abdomen she says that she is achy feeling but denies any severe tenderness.  We talked about CT scan to rule out other acute pathology such as appendicitis, diverticulitis, SBO but she states that her pain is very minimal and again all over her body not just on her abdomen.  She declined CT imaging at this time.  Labs were ordered to evaluate for Electra abnormalities, AKI.  She denies symptoms to suggest UTI.  Denies any shortness of breath or coughing to suggest pneumonia.  Lungs sounded clear.  She denies  any chest pain.  Will get EKG just to make sure is not an atypical presentation.  EKG was difficult to interpret due to some artifact and there was a little bit of ST elevation in aVL and lead II but it looks more artifact related than true ST elevation.  Discussed with patient and I will add on a cardiac marker to her prior labs given her symptoms have been going on greater than 3 hours.  Cardiac marker was negative.  Covid test was negative. Labs are reassuring. No evidence of dehydration. No evidence of anemia.  At this time patient feels comfortable with discharge home we will give a prescription for Zofran, work note for 2 days and can return to the ER if she develops worsening abdominal pain      ____________________________________________   FINAL CLINICAL IMPRESSION(S) / ED DIAGNOSES   Final diagnoses:  Viral illness  Nausea and vomiting, intractability of vomiting not specified, unspecified vomiting type      MEDICATIONS GIVEN DURING THIS VISIT:  Medications - No data to display   ED Discharge Orders         Ordered    ondansetron (ZOFRAN ODT) 4 MG disintegrating tablet  Every 8 hours PRN        03/08/20 1311           Note:  This document was prepared using Dragon voice recognition software and may include unintentional dictation errors.   Vanessa [place], MD 03/08/20 1352

## 2020-03-08 NOTE — ED Provider Notes (Signed)
MSE was initiated and I personally evaluated the patient and placed orders (if any) at  10:27 AM on March 08, 2020.  The patient appears stable so that the remainder of the MSE may be completed by another provider.  Symptoms and exam are consistent with covid or gastroenteritis   Versie Starks, PA-C 03/08/20 1028    Merlyn Lot, MD 03/08/20 1313

## 2020-03-08 NOTE — ED Triage Notes (Signed)
C/O vomiting yesterday and today c/o body aches and nausea.  AAOx3.  Skin warm and dry no apparent distress

## 2020-03-13 ENCOUNTER — Other Ambulatory Visit: Payer: Self-pay | Admitting: Family Medicine

## 2020-03-13 NOTE — Telephone Encounter (Signed)
Pharmacy requests refill on: Atorvastatin 20 mg   LAST REFILL: 07/22/2019 (Q-90, R-1) LAST OV: 07/20/2019 NEXT OV: 04/23/2020 PHARMACY: [place], [place]

## 2020-04-12 DIAGNOSIS — M1712 Unilateral primary osteoarthritis, left knee: Secondary | ICD-10-CM | POA: Diagnosis not present

## 2020-04-18 ENCOUNTER — Telehealth: Payer: Self-pay

## 2020-04-18 NOTE — Telephone Encounter (Addendum)
Received faxed letter from [place] stating pt needs surgical clearance for L total knee replacement.    Pt has wellness cpe on 04/23/20.  Per Dr. Darnell Level, we can use that appt for pre-op eval and r/s wellness cpe (also needs appt with wellness nurse and lab visits).  Or we can keep that appt for AWV (will still need lab visit) and schedule pre-op eval another day.   Left message on vm per dpr asking pt to call back letting us know which option she wants.   Lemont Fillers is in box on [employer].]

## 2020-04-20 NOTE — Telephone Encounter (Signed)
Received faxed letter from [place] stating pt needs surgical clearance for L total knee replacement.    Pt has wellness cpe on 04/23/20.  Per Dr. Darnell Level, we can use that appt for pre-op eval and r/s wellness cpe (also needs appt with wellness nurse and lab visits).  Or we can keep that appt for AWV (will still need lab visit) and schedule pre-op eval another day.   Left message on vm per dpr asking pt to call back letting us know which option she wants.   Lemont Fillers is in box on [employer].]

## 2020-04-23 ENCOUNTER — Other Ambulatory Visit: Payer: Self-pay

## 2020-04-23 ENCOUNTER — Ambulatory Visit (INDEPENDENT_AMBULATORY_CARE_PROVIDER_SITE_OTHER): Payer: Medicare HMO | Admitting: Family Medicine

## 2020-04-23 ENCOUNTER — Encounter: Payer: Self-pay | Admitting: Family Medicine

## 2020-04-23 VITALS — BP 130/70 | HR 84 | Temp 97.9°F | Ht 62.0 in | Wt 194.1 lb

## 2020-04-23 DIAGNOSIS — Z0000 Encounter for general adult medical examination without abnormal findings: Secondary | ICD-10-CM

## 2020-04-23 DIAGNOSIS — E785 Hyperlipidemia, unspecified: Secondary | ICD-10-CM

## 2020-04-23 DIAGNOSIS — Z7189 Other specified counseling: Secondary | ICD-10-CM

## 2020-04-23 DIAGNOSIS — E119 Type 2 diabetes mellitus without complications: Secondary | ICD-10-CM

## 2020-04-23 DIAGNOSIS — E1169 Type 2 diabetes mellitus with other specified complication: Secondary | ICD-10-CM | POA: Diagnosis not present

## 2020-04-23 DIAGNOSIS — K429 Umbilical hernia without obstruction or gangrene: Secondary | ICD-10-CM

## 2020-04-23 DIAGNOSIS — R1900 Intra-abdominal and pelvic swelling, mass and lump, unspecified site: Secondary | ICD-10-CM

## 2020-04-23 DIAGNOSIS — N6092 Unspecified benign mammary dysplasia of left breast: Secondary | ICD-10-CM

## 2020-04-23 MED ORDER — METFORMIN HCL 500 MG PO TABS: 500.0000 mg | ORAL_TABLET | Freq: Every day | ORAL | 3 refills | Status: DC

## 2020-04-23 MED ORDER — ATORVASTATIN CALCIUM 20 MG PO TABS: 20.0000 mg | ORAL_TABLET | Freq: Every day | ORAL | 3 refills | Status: DC

## 2020-04-23 NOTE — Assessment & Plan Note (Signed)
Advanced planning - does not have set up. Doesn't know who HCPOA would be - probably daughter [name]. Packet provided today

## 2020-04-23 NOTE — Telephone Encounter (Addendum)
Pt will keep today's AWV and schedule pre-op eval for another day.    Brandy Guerrero is in Copy on [employer].]

## 2020-04-23 NOTE — Progress Notes (Signed)
Patient ID: Brandy Guerrero, female    DOB: 1950/09/14, 70 y.o.   MRN: [id number]  This visit was conducted in person.  BP 130/70   Pulse 84   Temp 97.9 F (36.6 C) (Temporal)   Ht 5\' 2"  (1.575 m)   Wt 194 lb 2 oz (88.1 kg)   SpO2 96%   BMI 35.51 kg/m    CC: AMW/CPE Subjective:   HPI: Brandy Guerrero is a 70 y.o. female presenting on 04/23/2020 for Medicare Wellness   Did not see health advisor this year.    Hearing Screening   125Hz  250Hz  500Hz  1000Hz  2000Hz  3000Hz  4000Hz  6000Hz  8000Hz   Right ear:   40 0 20  0    Left ear:   20 20 20  20     Vision Screening Comments: Last eye exam, 05/2019.  [facility] Office Visit from 04/23/2020 in [facility]  PHQ-2 Total Score 3     Fall Risk  04/23/2020 09/22/2018 04/28/2017  Falls in the past year? 0 0 No  Follow up - Falls evaluation completed -   Will return for preop evaluation for upcoming L total knee replacement surgery ([facility]).   Not fasting today. Will return tomorrow for labs.  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   Preventative: Colon cancer screening - iFOB positive, s/p colonoscopy 01/2012 Brandy Guerrero) with diverticulosis, but no reason for bleed found. Rpt stool cards negative. Otherwise rpt colonoscopy 10 yrs.  Well woman with [name] - s/p TAH for fibroids, one ovary remains. Established with [name] 2020. Has not returned to see.  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 Rpt mammo 11/2018 - biopsy proven ADH calcifications L breast - pt declined surgery - rec f/u bilateral diagnostic mammo 1 yr. Has not called to schedule f/u - advised to call and schedule.  DEXA 2013 WNL  Flu shotyearly  COVID vaccine Moderna 03/2019, 04/2019, booster 12/2019 Prevnar - 04/2017, pneumovax 12/2018 Tdap 2013  Shingrix - discussed Advanced planning -  does not have set up. Doesn't know who HCPOA would be - probably daughter [name]. Packet provided today Seat belt use discussed. Sunscreen use discussed.No changing moles on skin. 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  Activity: starting regular walking - 3 mi about 2d/wk  Diet: good water, fruits/vegetables daily     Relevant past medical, surgical, family and social history reviewed and updated as indicated. Interim medical history since our last visit reviewed. Allergies and medications reviewed and updated. Outpatient Medications Prior to Visit  Medication Sig Dispense Refill  . meclizine (ANTIVERT) 25 MG tablet Take 1 tablet (25 mg total) by mouth at bedtime. (Patient taking differently: Take 25 mg by mouth at bedtime. As needed) 30 tablet 0  . metoprolol tartrate (LOPRESSOR) 25 MG tablet Take 1 tablet (25 mg total) by mouth 2 (two) times daily as needed. Heart fluttering 60 tablet 3  . atorvastatin (LIPITOR) 20 MG tablet Take 1 tablet by mouth once daily 90 tablet 0  . latanoprost (XALATAN) 0.005 % ophthalmic solution     .  metFORMIN (GLUCOPHAGE) 500 MG tablet Take 1 tablet (500 mg total) by mouth daily with breakfast. 90 tablet 3  . latanoprost (XALATAN) 0.005 % ophthalmic solution Place 1 drop into the left eye at bedtime.    Marland Kitchen atorvastatin (LIPITOR) 40 MG tablet Take 1 tablet (40 mg total) by mouth daily. 90 tablet 1  . ondansetron (ZOFRAN ODT) 4 MG disintegrating tablet Take 1 tablet (4 mg total) by mouth every 8 (eight) hours as needed for nausea or vomiting. 20 tablet 0   No facility-administered medications prior to visit.     Per HPI unless specifically indicated in ROS  section below Review of Systems  Constitutional: Negative for activity change, appetite change, chills, fatigue, fever and unexpected weight change.  HENT: Negative for hearing loss.   Eyes: Negative for visual disturbance.  Respiratory: Negative for cough, chest tightness, shortness of breath and wheezing.   Cardiovascular: Negative for chest pain, palpitations and leg swelling.  Gastrointestinal: Negative for abdominal distention, abdominal pain, blood in stool, constipation, diarrhea, nausea and vomiting.  Genitourinary: Negative for difficulty urinating and hematuria.  Musculoskeletal: Negative for arthralgias, myalgias and neck pain.  Skin: Negative for rash.  Neurological: Negative for dizziness, seizures, syncope and headaches.  Hematological: Negative for adenopathy. Does not bruise/bleed easily.  Psychiatric/Behavioral: Negative for dysphoric mood. The patient is not nervous/anxious.    Objective:  BP 130/70   Pulse 84   Temp 97.9 F (36.6 C) (Temporal)   Ht 5\' 2"  (1.575 m)   Wt 194 lb 2 oz (88.1 kg)   SpO2 96%   BMI 35.51 kg/m   Wt Readings from Last 3 Encounters:  04/23/20 194 lb 2 oz (88.1 kg)  03/08/20 193 lb 2 oz (87.6 kg)  07/20/19 193 lb 3.2 oz (87.6 kg)      Physical Exam Vitals and nursing note reviewed.  Constitutional:      General: She is not in acute distress.    Appearance: Normal appearance. She is well-developed. She is not ill-appearing.  HENT:     Head: Normocephalic and atraumatic.     Right Ear: Hearing, tympanic membrane, ear canal and external ear normal.     Left Ear: Hearing, tympanic membrane, ear canal and external ear normal.  Eyes:     General: No scleral icterus.    Extraocular Movements: Extraocular movements intact.     Conjunctiva/sclera: Conjunctivae normal.     Pupils: Pupils are equal, round, and reactive to light.  Neck:     Thyroid: No thyroid mass or thyromegaly.     Vascular: No carotid bruit.  Cardiovascular:     Rate  and Rhythm: Normal rate and regular rhythm.     Pulses: Normal pulses.          Radial pulses are 2+ on the right side and 2+ on the left side.     Heart sounds: Normal heart sounds. No murmur heard.   Pulmonary:     Effort: Pulmonary effort is normal. No respiratory distress.     Breath sounds: Normal breath sounds. No wheezing, rhonchi or rales.  Abdominal:     General: Bowel sounds are normal. There is no distension.     Palpations: Abdomen is soft. There is no mass.     Tenderness: There is no abdominal tenderness. There is no guarding or rebound.     Hernia: A hernia is present. Hernia is present in the umbilical area (nontender).  Musculoskeletal:        General: Normal  range of motion.     Cervical back: Normal range of motion and neck supple.     Right lower leg: No edema.     Left lower leg: No edema.  Lymphadenopathy:     Cervical: No cervical adenopathy.  Skin:    General: Skin is warm and dry.     Findings: No rash.  Neurological:     General: No focal deficit present.     Mental Status: She is alert and oriented to person, place, and time.     Comments:  CN grossly intact, station and gait intact Recall 3/3  Calculation 5/5 DLROW  Psychiatric:        Mood and Affect: Mood normal.        Behavior: Behavior normal.        Thought Content: Thought content normal.        Judgment: Judgment normal.       Results for orders placed or performed during the hospital encounter of 03/08/20  SARS Coronavirus 2 by RT PCR (hospital order, performed in [facility] hospital lab) Nasopharyngeal Nasopharyngeal Swab   Specimen: Nasopharyngeal Swab  Result Value Ref Range   SARS Coronavirus 2 NEGATIVE NEGATIVE  Comprehensive metabolic panel  Result Value Ref Range   Sodium 139 135 - 145 mmol/L   Potassium 3.7 3.5 - 5.1 mmol/L   Chloride 101 98 - 111 mmol/L   CO2 28 22 - 32 mmol/L   Glucose, Bld 148 (H) 70 - 99 mg/dL   BUN 12 8 - 23 mg/dL   Creatinine, Ser 0.62 0.44 - 1.00  mg/dL   Calcium 8.5 (L) 8.9 - 10.3 mg/dL   Total Protein 7.4 6.5 - 8.1 g/dL   Albumin 3.7 3.5 - 5.0 g/dL   AST 20 15 - 41 U/L   ALT 22 0 - 44 U/L   Alkaline Phosphatase 101 38 - 126 U/L   Total Bilirubin 0.8 0.3 - 1.2 mg/dL   GFR, Estimated >60 >60 mL/min   Anion gap 10 5 - 15  Lipase, blood  Result Value Ref Range   Lipase 25 11 - 51 U/L  CBC with Differential  Result Value Ref Range   WBC 6.3 4.0 - 10.5 K/uL   RBC 4.18 3.87 - 5.11 MIL/uL   Hemoglobin 12.6 12.0 - 15.0 g/dL   HCT 39.7 36.0 - 46.0 %   MCV 95.0 80.0 - 100.0 fL   MCH 30.1 26.0 - 34.0 pg   MCHC 31.7 30.0 - 36.0 g/dL   RDW 12.4 11.5 - 15.5 %   Platelets 260 150 - 400 K/uL   nRBC 0.0 0.0 - 0.2 %   Neutrophils Relative % 67 %   Neutro Abs 4.3 1.7 - 7.7 K/uL   Lymphocytes Relative 21 %   Lymphs Abs 1.3 0.7 - 4.0 K/uL   Monocytes Relative 10 %   Monocytes Absolute 0.6 0.1 - 1.0 K/uL   Eosinophils Relative 1 %   Eosinophils Absolute 0.1 0.0 - 0.5 K/uL   Basophils Relative 0 %   Basophils Absolute 0.0 0.0 - 0.1 K/uL   Immature Granulocytes 1 %   Abs Immature Granulocytes 0.03 0.00 - 0.07 K/uL  Troponin I (High Sensitivity)  Result Value Ref Range   Troponin I (High Sensitivity) 4 <18 ng/L   Lab Results  Component Value Date   HGBA1C 6.7 (H) 07/20/2019   Depression screen PHQ 2/9 04/23/2020 12/27/2018 10/28/2017 10/27/2016  Decreased Interest 2 0 0 1  Down,  Depressed, Hopeless 1 2 2  0  PHQ - 2 Score 3 2 2 1   Altered sleeping 0 0 1 -  Tired, decreased energy 1 3 1  -  Change in appetite 3 0 0 -  Feeling bad or failure about yourself  0 2 0 -  Trouble concentrating 2 0 0 -  Moving slowly or fidgety/restless 0 0 0 -  Suicidal thoughts 0 0 0 -  PHQ-9 Score 9 7 4  -    GAD 7 : Generalized Anxiety Score 04/23/2020 12/27/2018 10/28/2017  Nervous, Anxious, on Edge 0 0 0  Control/stop worrying 0 1 0  Worry too much - different things 2 2 1   Trouble relaxing 2 1 1   Restless 1 1 0  Easily annoyed or irritable 1 1 1    Afraid - awful might happen 0 0 0  Total GAD 7 Score 6 6 3    Assessment & Plan:  This visit occurred during the SARS-CoV-2 public health emergency.  Safety protocols were in place, including screening questions prior to the visit, additional usage of staff PPE, and extensive cleaning of exam room while observing appropriate contact time as indicated for disinfecting solutions.   Problem List Items Addressed This Visit    Hyperlipidemia associated with type 2 diabetes mellitus ([place])    Update FLP on atorvastatin. The 10-year ASCVD risk score Mikey Bussing DC Brooke Bonito., et al., 2013) is: 20%   Values used to calculate the score:     Age: 29 years     Sex: Female     Is Non-Hispanic African American: Yes     Diabetic: Yes     Tobacco smoker: No     Systolic Blood Pressure: 099 mmHg     Is BP treated: No     HDL Cholesterol: 47 mg/dL     Total Cholesterol: 167 mg/dL       Relevant Medications   atorvastatin (LIPITOR) 20 MG tablet   metFORMIN (GLUCOPHAGE) 500 MG tablet   Other Relevant Orders   Comprehensive metabolic panel   Lipid panel   TSH   Pelvic mass in female    Has seen GYN and gen surg multiple times, has decided to postpone surgery at this time.       Umbilical hernia    Has seen gen surgery, decided to postpone repair at this time.       Severe obesity (BMI 35.0-39.9) with comorbidity ([place])    Encouraged healthy diet and lifestyle choices to affect sustainable weight loss.       Relevant Medications   metFORMIN (GLUCOPHAGE) 500 MG tablet   Health maintenance examination    Preventative protocols reviewed and updated unless pt declined. Discussed healthy diet and lifestyle.       Controlled type 2 diabetes mellitus without complication, without long-term current use of insulin (H[facility])    Chronic, stable on metformin. Update labs when she returns fasting.       Relevant Medications   atorvastatin (LIPITOR) 20 MG tablet   metFORMIN (GLUCOPHAGE) 500 MG tablet   Other  Relevant Orders   Hemoglobin A1c   Microalbumin / creatinine urine ratio   Atypical ductal hyperplasia of left breast    Due for rpt mammo - she will call to schedule, # provided.       Advanced care planning/counseling discussion    Advanced planning - does not have set up. Doesn't know who HCPOA would be - probably daughter [name]. Packet provided today  Medicare annual wellness visit, initial - Primary    I have personally reviewed the Medicare Annual Wellness questionnaire and have noted 1. The patient's medical and social history 2. Their use of alcohol, tobacco or illicit drugs 3. Their current medications and supplements 4. The patient's functional ability including ADL's, fall risks, home safety risks and hearing or visual impairment. Cognitive function has been assessed and addressed as indicated.  5. Diet and physical activity 6. Evidence for depression or mood disorders The patients weight, height, BMI have been recorded in the chart. I have made referrals, counseling and provided education to the patient based on review of the above and I have provided the pt with a written personalized care plan for preventive services. Provider list updated.. See scanned questionairre as needed for further documentation. Reviewed preventative protocols and updated unless pt declined.           Meds ordered this encounter  Medications  . atorvastatin (LIPITOR) 20 MG tablet    Sig: Take 1 tablet (20 mg total) by mouth daily.    Dispense:  90 tablet    Refill:  3  . metFORMIN (GLUCOPHAGE) 500 MG tablet    Sig: Take 1 tablet (500 mg total) by mouth daily with breakfast.    Dispense:  90 tablet    Refill:  3   Orders Placed This Encounter  Procedures  . Comprehensive metabolic panel    Standing Status:   Future    Standing Expiration Date:   04/24/2021  . Lipid panel    Standing Status:   Future    Standing Expiration Date:   04/24/2021  . Hemoglobin A1c     Standing Status:   Future    Standing Expiration Date:   04/24/2021  . Microalbumin / creatinine urine ratio    Standing Status:   Future    Standing Expiration Date:   04/24/2021  . TSH    Standing Status:   Future    Standing Expiration Date:   04/24/2021    Patient instructions: Return fasting for blood work.  Call to schedule mammogram at your convenience: [place] [phone] Work on completing living will.  Work on advanced directives - packet provided today.  Return as needed or in 6 months for diabetes Return when able for pre-op evaluation.   Follow up plan: Return in about 6 months (around 10/24/2020) for follow up visit.  Ria Bush, MD

## 2020-04-23 NOTE — Patient Instructions (Addendum)
Return fasting for blood work.  Call to schedule mammogram at your convenience: [facility] [phone] Work on completing living will.  Work on advanced directives - packet provided today.  Return as needed or in 6 months for diabetes Return when able for pre-op evaluation.   Health Maintenance After Age 69 After age 29, you are at a higher risk for certain long-term diseases and infections as well as injuries from falls. Falls are a major cause of broken bones and head injuries in people who are older than age 76. Getting regular preventive care can help to keep you healthy and well. Preventive care includes getting regular testing and making lifestyle changes as recommended by your health care provider. Talk with your health care provider about:  Which screenings and tests you should have. A screening is a test that checks for a disease when you have no symptoms.  A diet and exercise plan that is right for you. What should I know about screenings and tests to prevent falls? Screening and testing are the best ways to find a health problem early. Early diagnosis and treatment give you the best chance of managing medical conditions that are common after age 41. Certain conditions and lifestyle choices may make you more likely to have a fall. Your health care provider may recommend:  Regular vision checks. Poor vision and conditions such as cataracts can make you more likely to have a fall. If you wear glasses, make sure to get your prescription updated if your vision changes.  Medicine review. Work with your health care provider to regularly review all of the medicines you are taking, including over-the-counter medicines. Ask your health care provider about any side effects that may make you more likely to have a fall. Tell your health care provider if any medicines that you take make you feel dizzy or sleepy.  Osteoporosis screening. Osteoporosis is a condition that causes the  bones to get weaker. This can make the bones weak and cause them to break more easily.  Blood pressure screening. Blood pressure changes and medicines to control blood pressure can make you feel dizzy.  Strength and balance checks. Your health care provider may recommend certain tests to check your strength and balance while standing, walking, or changing positions.  Foot health exam. Foot pain and numbness, as well as not wearing proper footwear, can make you more likely to have a fall.  Depression screening. You may be more likely to have a fall if you have a fear of falling, feel emotionally low, or feel unable to do activities that you used to do.  Alcohol use screening. Using too much alcohol can affect your balance and may make you more likely to have a fall. What actions can I take to lower my risk of falls? General instructions  Talk with your health care provider about your risks for falling. Tell your health care provider if: ? You fall. Be sure to tell your health care provider about all falls, even ones that seem minor. ? You feel dizzy, sleepy, or off-balance.  Take over-the-counter and prescription medicines only as told by your health care provider. These include any supplements.  Eat a healthy diet and maintain a healthy weight. A healthy diet includes low-fat dairy products, low-fat (lean) meats, and fiber from whole grains, beans, and lots of fruits and vegetables. Home safety  Remove any tripping hazards, such as rugs, cords, and clutter.  Install safety equipment such as grab bars  in bathrooms and safety rails on stairs.  Keep rooms and walkways well-lit. Activity  Follow a regular exercise program to stay fit. This will help you maintain your balance. Ask your health care provider what types of exercise are appropriate for you.  If you need a cane or walker, use it as recommended by your health care provider.  Wear supportive shoes that have nonskid soles.    Lifestyle  Do not drink alcohol if your health care provider tells you not to drink.  If you drink alcohol, limit how much you have: ? 0-1 drink a day for women. ? 0-2 drinks a day for men.  Be aware of how much alcohol is in your drink. In the [place] one drink equals one typical bottle of beer (12 oz), one-half glass of wine (5 oz), or one shot of hard liquor (1 oz).  Do not use any products that contain nicotine or tobacco, such as cigarettes and e-cigarettes. If you need help quitting, ask your health care provider. Summary  Having a healthy lifestyle and getting preventive care can help to protect your health and wellness after age 34.  Screening and testing are the best way to find a health problem early and help you avoid having a fall. Early diagnosis and treatment give you the best chance for managing medical conditions that are more common for people who are older than age 57.  Falls are a major cause of broken bones and head injuries in people who are older than age 21. Take precautions to prevent a fall at home.  Work with your health care provider to learn what changes you can make to improve your health and wellness and to prevent falls. This information is not intended to replace advice given to you by your health care provider. Make sure you discuss any questions you have with your health care provider. Document Revised: 05/20/2018 Document Reviewed: 12/10/2016 Elsevier Patient Education  2021 [employer].

## 2020-04-24 ENCOUNTER — Other Ambulatory Visit (INDEPENDENT_AMBULATORY_CARE_PROVIDER_SITE_OTHER): Payer: Medicare HMO

## 2020-04-24 DIAGNOSIS — E119 Type 2 diabetes mellitus without complications: Secondary | ICD-10-CM

## 2020-04-24 DIAGNOSIS — Z0000 Encounter for general adult medical examination without abnormal findings: Secondary | ICD-10-CM | POA: Insufficient documentation

## 2020-04-24 DIAGNOSIS — E1169 Type 2 diabetes mellitus with other specified complication: Secondary | ICD-10-CM | POA: Diagnosis not present

## 2020-04-24 DIAGNOSIS — E785 Hyperlipidemia, unspecified: Secondary | ICD-10-CM

## 2020-04-24 LAB — COMPREHENSIVE METABOLIC PANEL
ALT: 13 U/L (ref 0–35)
AST: 14 U/L (ref 0–37)
Albumin: 3.9 g/dL (ref 3.5–5.2)
Alkaline Phosphatase: 121 U/L — ABNORMAL HIGH (ref 39–117)
BUN: 15 mg/dL (ref 6–23)
CO2: 31 mEq/L (ref 19–32)
Calcium: 9.1 mg/dL (ref 8.4–10.5)
Chloride: 104 mEq/L (ref 96–112)
Creatinine, Ser: 0.75 mg/dL (ref 0.40–1.20)
GFR: 81.8 mL/min (ref >60.00)
Glucose, Bld: 177 mg/dL — ABNORMAL HIGH (ref 70–99)
Potassium: 4 mEq/L (ref 3.5–5.1)
Sodium: 140 mEq/L (ref 135–145)
Total Bilirubin: 0.6 mg/dL (ref 0.2–1.2)
Total Protein: 6.8 g/dL (ref 6.0–8.3)

## 2020-04-24 LAB — MICROALBUMIN / CREATININE URINE RATIO
Creatinine,U: 94.7 mg/dL
Microalb Creat Ratio: 0.9 mg/g (ref 0.0–30.0)
Microalb, Ur: 0.8 mg/dL (ref 0.0–1.9)

## 2020-04-24 LAB — LIPID PANEL
Cholesterol: 158 mg/dL (ref 0–200)
HDL: 48.5 mg/dL (ref >39.00)
LDL Cholesterol: 94 mg/dL (ref 0–99)
NonHDL: 109.79
Total CHOL/HDL Ratio: 3
Triglycerides: 81 mg/dL (ref 0.0–149.0)
VLDL: 16.2 mg/dL (ref 0.0–40.0)

## 2020-04-24 LAB — TSH: TSH: 1.84 u[IU]/mL (ref 0.35–4.50)

## 2020-04-24 LAB — HEMOGLOBIN A1C: Hgb A1c MFr Bld: 7 % — ABNORMAL HIGH (ref 4.6–6.5)

## 2020-04-24 NOTE — Assessment & Plan Note (Signed)
Encouraged healthy diet and lifestyle choices to affect sustainable weight loss.  ?

## 2020-04-24 NOTE — Assessment & Plan Note (Signed)

## 2020-04-24 NOTE — Assessment & Plan Note (Addendum)
Has seen GYN and gen surg multiple times, has decided to postpone surgery at this time.

## 2020-04-24 NOTE — Assessment & Plan Note (Signed)
Preventative protocols reviewed and updated unless pt declined. Discussed healthy diet and lifestyle.  

## 2020-04-24 NOTE — Assessment & Plan Note (Signed)
Update FLP on atorvastatin. The 10-year ASCVD risk score Mikey Bussing DC Brooke Bonito., et al., 2013) is: 20%   Values used to calculate the score:     Age: 70 years     Sex: Female     Is Non-Hispanic African American: Yes     Diabetic: Yes     Tobacco smoker: No     Systolic Blood Pressure: 962 mmHg     Is BP treated: No     HDL Cholesterol: 47 mg/dL     Total Cholesterol: 167 mg/dL

## 2020-04-24 NOTE — Assessment & Plan Note (Addendum)
Has seen gen surgery, decided to postpone repair at this time.

## 2020-04-24 NOTE — Assessment & Plan Note (Signed)
Chronic, stable on metformin. Update labs when she returns fasting.

## 2020-04-24 NOTE — Assessment & Plan Note (Signed)
Due for rpt mammo - she will call to schedule, # provided.

## 2020-06-07 ENCOUNTER — Other Ambulatory Visit: Payer: Self-pay | Admitting: Family Medicine

## 2020-06-07 DIAGNOSIS — N6092 Unspecified benign mammary dysplasia of left breast: Secondary | ICD-10-CM

## 2020-06-08 DIAGNOSIS — H40052 Ocular hypertension, left eye: Secondary | ICD-10-CM | POA: Diagnosis not present

## 2020-06-08 LAB — HM DIABETES EYE EXAM

## 2020-06-12 ENCOUNTER — Encounter: Payer: Self-pay | Admitting: Family Medicine

## 2020-06-15 ENCOUNTER — Telehealth: Payer: Self-pay

## 2020-06-15 NOTE — Telephone Encounter (Signed)
Received faxed pre-op eval form from [facility] for L TKR.  Surgery not scheduled.  Lvm asking pt to call back.  Need to schedule pre-op eval.    [Form in basket on Lisa's desk.]

## 2020-06-18 NOTE — Telephone Encounter (Signed)
Spoke with pt to schedule pre-op eval.  States she wants to postpone surgery until about 11/2020.  In the meantime, she is requesting referral for PT.  She wasn't sure if that would come from Dr. Darnell Level or ortho.  States her insurance is giving her 2 wks.  Plz advise.

## 2020-06-20 NOTE — Telephone Encounter (Signed)
plz remind her to schedule preop visit at least a few in early 10/2020.  I recommend she contact ortho for PT as they evaluated her knee and I did not at the latest OV.

## 2020-06-20 NOTE — Telephone Encounter (Signed)
Left message on voicemail for patient to call the office back. 

## 2020-06-20 NOTE — Telephone Encounter (Signed)
Lvm asking pt to call back.  Need to relay Dr. G's message.  

## 2020-06-21 NOTE — Telephone Encounter (Signed)
Lvm asking pt to call back.  Need to relay Dr. G's message.  

## 2020-06-22 NOTE — Telephone Encounter (Signed)
Lvm asking pt to call back.  Need to relay Dr. Synthia Innocent message.  Also, mailing a letter

## 2020-07-05 ENCOUNTER — Other Ambulatory Visit: Payer: Self-pay | Admitting: Family Medicine

## 2020-07-13 ENCOUNTER — Other Ambulatory Visit: Payer: Self-pay | Admitting: Family Medicine

## 2020-07-13 ENCOUNTER — Ambulatory Visit
Admission: RE | Admit: 2020-07-13 | Discharge: 2020-07-13 | Disposition: A | Discharge status: Home / Self Care | Payer: Medicare HMO | Source: Ambulatory Visit | Attending: Family Medicine | Admitting: Family Medicine

## 2020-07-13 DIAGNOSIS — N6092 Unspecified benign mammary dysplasia of left breast: Secondary | ICD-10-CM

## 2020-07-13 DIAGNOSIS — R921 Mammographic calcification found on diagnostic imaging of breast: Secondary | ICD-10-CM | POA: Diagnosis not present

## 2020-07-13 DIAGNOSIS — N6322 Unspecified lump in the left breast, upper inner quadrant: Secondary | ICD-10-CM | POA: Diagnosis not present

## 2020-08-02 ENCOUNTER — Ambulatory Visit
Admission: RE | Admit: 2020-08-02 | Discharge: 2020-08-02 | Disposition: A | Discharge status: Home / Self Care | Payer: Medicare HMO | Source: Ambulatory Visit | Attending: Family Medicine | Admitting: Family Medicine

## 2020-08-02 ENCOUNTER — Other Ambulatory Visit: Payer: Self-pay

## 2020-08-02 DIAGNOSIS — N6092 Unspecified benign mammary dysplasia of left breast: Secondary | ICD-10-CM

## 2020-08-02 DIAGNOSIS — N6322 Unspecified lump in the left breast, upper inner quadrant: Secondary | ICD-10-CM | POA: Diagnosis not present

## 2020-08-02 DIAGNOSIS — C50212 Malignant neoplasm of upper-inner quadrant of left female breast: Secondary | ICD-10-CM | POA: Diagnosis not present

## 2020-08-02 NOTE — Telephone Encounter (Addendum)
Received another surgical clearance form from [facility] for TKR, left.  Pt had initially deferred surgery until 10/2020. (See message below)  Spoke with pt to ask if she changed her mind about having surgery in 10/2020.  States she has decided to not have surgery at all.  Pt will be getting "cartilage injections" instead with [facility].  They are currently setting it up.  FYI to Dr. Darnell Level.   Shredded pre-op form.

## 2020-08-10 ENCOUNTER — Telehealth: Payer: Self-pay | Admitting: Family Medicine

## 2020-08-10 NOTE — Telephone Encounter (Signed)
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 ER+, PR+, Her2 -.  She has upcoming gen surgery appt scheduled for July 6th.  Unable to reach her - will try next week.

## 2020-08-14 NOTE — Telephone Encounter (Signed)
Called again, left message. Will try again later.

## 2020-08-15 ENCOUNTER — Ambulatory Visit: Payer: Self-pay | Admitting: General Surgery

## 2020-08-15 DIAGNOSIS — C50212 Malignant neoplasm of upper-inner quadrant of left female breast: Secondary | ICD-10-CM

## 2020-08-15 DIAGNOSIS — Z170 Estrogen receptor positive status [ER+]: Secondary | ICD-10-CM | POA: Diagnosis not present

## 2020-08-15 NOTE — Telephone Encounter (Signed)
Called again, spoke with patient.  She saw Dr [name] today discussing surgery and possible radiation.

## 2020-08-17 ENCOUNTER — Telehealth: Payer: Self-pay | Admitting: Hematology and Oncology

## 2020-08-17 NOTE — Telephone Encounter (Signed)
Received a new pt referral from [facility] for a new dx of brca. Pt has been cld and scheduled to see Dr. Lindi Adie on 7/22 at 1pm per pt's request. Aware to arrive 15 minutes early.

## 2020-08-20 ENCOUNTER — Other Ambulatory Visit: Payer: Self-pay | Admitting: General Surgery

## 2020-08-20 DIAGNOSIS — Z170 Estrogen receptor positive status [ER+]: Secondary | ICD-10-CM

## 2020-08-20 DIAGNOSIS — C50212 Malignant neoplasm of upper-inner quadrant of left female breast: Secondary | ICD-10-CM

## 2020-08-23 ENCOUNTER — Other Ambulatory Visit: Payer: Self-pay | Admitting: General Surgery

## 2020-08-23 DIAGNOSIS — Z170 Estrogen receptor positive status [ER+]: Secondary | ICD-10-CM

## 2020-08-23 DIAGNOSIS — C50212 Malignant neoplasm of upper-inner quadrant of left female breast: Secondary | ICD-10-CM

## 2020-08-30 NOTE — Progress Notes (Signed)
[facility] CONSULT NOTE  Patient Care Team: Ria Bush, MD as PCP - General (Family Medicine)  CHIEF COMPLAINTS/PURPOSE OF CONSULTATION:  Newly diagnosed breast cancer  HISTORY OF PRESENTING ILLNESS:  Brandy Guerrero 70 y.o. female is here because of recent diagnosis of invasive ductal carcinoma of the left breast. Biopsy of left breast calcifications on 05/23/2016 showed atypical ductal hyperplasia. Diagnostic mammogram and [place] on 07/13/20 showed a 7 mm mass in the 10 o'clock position of the left breast. 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 She is scheduled for a left breast lumpectomy on 09/26/20 with Dr. [name]. She presents to the clinic today for initial evaluation and discussion of treatment options.   I reviewed her records extensively and collaborated the history with the patient.  SUMMARY OF ONCOLOGIC HISTORY: Oncology History  Atypical ductal hyperplasia of left breast  08/02/2020 Initial Diagnosis   Invasive ductal carcinoma of the left breast  Biopsy of left breast calcifications on 05/23/2016 showed atypical ductal hyperplasia. Diagnostic mammogram and [place] on 07/13/20 showed a 7 mm mass in the 10 o'clock position of the left breast. 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   Malignant neoplasm of upper-outer quadrant of left breast in female, estrogen receptor positive ([place])  08/02/2020 Initial Diagnosis   Screening mammogram detected left breast mass measuring 7 mm at 10 o'clock position.  Biopsy revealed grade 2 IDC with DCIS with calcifications, ER 90%, PR 100%, Ki67 10%, HER2 negative     MEDICAL HISTORY:  Past Medical History:  Diagnosis Date   Acne    dermatology - on doxy 169m daily   Anemia    History of atrial fibrillation 05/2005   h/o parox Afib, nl cardiolite/echo, no  prolonged episodes.  metoprolol prn ([facility]   HLD (hyperlipidemia)    Obesity    Panic attacks    Pelvic mass    declines resection   Positive occult stool blood test 2014   s/p normal colonoscopy, rpt stool cards negative   Prediabetes     SURGICAL HISTORY: Past Surgical History:  Procedure Laterality Date   ABDOMINAL HYSTERECTOMY  1990s   for fibroids, has one ovary remaining ([place]). partial   BREAST BIOPSY     COLONOSCOPY  01/2012   mod diverticulosis, rec rpt stool cards and if + rec EGD (returned negative), rpt colonoscopy 10 yrs    SOCIAL HISTORY: Social History   Socioeconomic History   Marital status: Married    Spouse name: Not on file   Number of children: Not on file   Years of education: Not on file   Highest education level: Not on file  Occupational History   Not on file  Tobacco Use   Smoking status: Never   Smokeless tobacco: Never  Substance and Sexual Activity   Alcohol use: Yes    Alcohol/week: 0.0 standard drinks    Comment: Rare   Drug use: No   Sexual activity: Yes    Comment: 1st intercourse 70yo-More than 5 partners  Other Topics Concern   Not on file  Social History Narrative   Caffeine: none    Lives with husband, has grown children, outside pets (husband is dBiomedical engineer    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, [employer] on weekends    Activity: starting regular walking - 3 mi about 2d/wk    Diet: good  water, fruits/vegetables daily    Social Determinants of Health   Financial Resource Strain: Not on file  Food Insecurity: Not on file  Transportation Needs: Not on file  Physical Activity: Not on file  Stress: Not on file  Social Connections: Not on file  Intimate Partner Violence: Not on file    FAMILY HISTORY: Family History  Problem Relation Age of Onset   Hypertension Mother    Alcohol abuse Brother    Alcohol abuse Father    Diabetes Daughter    Cancer Neg Hx    Coronary artery disease Neg Hx     Stroke Neg Hx     ALLERGIES:  is allergic to sulfa antibiotics.  MEDICATIONS:  Current Outpatient Medications  Medication Sig Dispense Refill   atorvastatin (LIPITOR) 20 MG tablet Take 1 tablet by mouth once daily 90 tablet 3   latanoprost (XALATAN) 0.005 % ophthalmic solution Place 1 drop into the left eye at bedtime.     meclizine (ANTIVERT) 25 MG tablet Take 1 tablet (25 mg total) by mouth at bedtime. (Patient taking differently: Take 25 mg by mouth at bedtime. As needed) 30 tablet 0   metFORMIN (GLUCOPHAGE) 500 MG tablet Take 1 tablet (500 mg total) by mouth daily with breakfast. 90 tablet 3   metoprolol tartrate (LOPRESSOR) 25 MG tablet Take 1 tablet (25 mg total) by mouth 2 (two) times daily as needed. Heart fluttering 60 tablet 3   No current facility-administered medications for this visit.    REVIEW OF SYSTEMS:   Constitutional: Denies fevers, chills or abnormal night sweats Eyes: Denies blurriness of vision, double vision or watery eyes Ears, nose, mouth, throat, and face: Denies mucositis or sore throat Respiratory: Denies cough, dyspnea or wheezes Cardiovascular: Denies palpitation, chest discomfort or lower extremity swelling Gastrointestinal:  Denies nausea, heartburn or change in bowel habits Skin: Denies abnormal skin rashes Lymphatics: Denies new lymphadenopathy or easy bruising Neurological:Denies numbness, tingling or new weaknesses Behavioral/Psych: Mood is stable, no new changes  Breast:  Denies any palpable lumps or discharge All other systems were reviewed with the patient and are negative.  PHYSICAL EXAMINATION: ECOG PERFORMANCE STATUS: 1 - Symptomatic but completely ambulatory  There were no vitals filed for this visit. There were no vitals filed for this visit.  GENERAL:alert, no distress and comfortable SKIN: skin color, texture, turgor are normal, no rashes or significant lesions EYES: normal, conjunctiva are pink and non-injected, sclera  clear OROPHARYNX:no exudate, no erythema and lips, buccal mucosa, and tongue normal  NECK: supple, thyroid normal size, non-tender, without nodularity LYMPH:  no palpable lymphadenopathy in the cervical, axillary or inguinal LUNGS: clear to auscultation and percussion with normal breathing effort HEART: regular rate & rhythm and no murmurs and no lower extremity edema ABDOMEN:abdomen soft, non-tender and normal bowel sounds Musculoskeletal:no cyanosis of digits and no clubbing  PSYCH: alert & oriented x 3 with fluent speech NEURO: no focal motor/sensory deficits BREAST: No palpable nodules in breast. No palpable axillary or supraclavicular lymphadenopathy (exam performed in the presence of a chaperone)   LABORATORY DATA:  I have reviewed the data as listed Lab Results  Component Value Date   WBC 6.3 03/08/2020   HGB 12.6 03/08/2020   HCT 39.7 03/08/2020   MCV 95.0 03/08/2020   PLT 260 03/08/2020   Lab Results  Component Value Date   NA 140 04/24/2020   K 4.0 04/24/2020   CL 104 04/24/2020   CO2 31 04/24/2020    RADIOGRAPHIC STUDIES: I  have personally reviewed the radiological reports and agreed with the findings in the report.  ASSESSMENT AND PLAN:  Malignant neoplasm of upper-outer quadrant of left breast in female, estrogen receptor positive ([place]) Diagnostic mammogram and [place] on 07/13/20 showed a 7 mm mass in the 10 o'clock position of the left breast. 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  Pathology and radiology counseling:Discussed with the patient, the details of pathology including the type of breast cancer,the clinical staging, the significance of ER, PR and HER-2/neu receptors and the implications for treatment. After reviewing the pathology in detail, we proceeded to discuss the different treatment options between surgery, radiation, chemotherapy, antiestrogen  therapies.  Recommendations: 1. Breast conserving surgery followed by 2. Oncotype DX testing to determine if chemotherapy would be of any benefit followed by 3. Adjuvant radiation therapy at [facility] 4. Adjuvant antiestrogen therapy  Oncotype counseling: I discussed Oncotype DX test. I explained to the patient that this is a 21 gene panel to evaluate patient tumors DNA to calculate recurrence score. This would help determine whether patient has high risk or low risk breast cancer. She understands that if her tumor was found to be high risk, she would benefit from systemic chemotherapy. If low risk, no need of chemotherapy.  Return to clinic after surgery with a telephone visit to discuss final pathology report and then determine if Oncotype DX testing will need to be sent.    All questions were answered. The patient knows to call the clinic with any problems, questions or concerns.   Rulon Eisenmenger, MD, MPH 08/31/2020    I, Thana Ates, am acting as scribe for Nicholas Lose, MD.  I have reviewed the above documentation for accuracy and completeness, and I agree with the above.

## 2020-08-31 ENCOUNTER — Other Ambulatory Visit: Payer: Self-pay

## 2020-08-31 ENCOUNTER — Inpatient Hospital Stay: Payer: Medicare HMO | Attending: Hematology and Oncology | Admitting: Hematology and Oncology

## 2020-08-31 DIAGNOSIS — Z170 Estrogen receptor positive status [ER+]: Secondary | ICD-10-CM | POA: Diagnosis not present

## 2020-08-31 DIAGNOSIS — C50412 Malignant neoplasm of upper-outer quadrant of left female breast: Secondary | ICD-10-CM

## 2020-08-31 NOTE — Assessment & Plan Note (Signed)
Diagnostic mammogram and [place] on 07/13/20 showed a 7 mm mass in the 10 o'clock position of the left breast. 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  Pathology and radiology counseling:Discussed with the patient, the details of pathology including the type of breast cancer,the clinical staging, the significance of ER, PR and HER-2/neu receptors and the implications for treatment. After reviewing the pathology in detail, we proceeded to discuss the different treatment options between surgery, radiation, chemotherapy, antiestrogen therapies.  Recommendations: 1. Breast conserving surgery followed by 2. Oncotype DX testing to determine if chemotherapy would be of any benefit followed by 3. Adjuvant radiation therapy followed by 4. Adjuvant antiestrogen therapy  Oncotype counseling: I discussed Oncotype DX test. I explained to the patient that this is a 21 gene panel to evaluate patient tumors DNA to calculate recurrence score. This would help determine whether patient has high risk or low risk breast cancer. She understands that if her tumor was found to be high risk, she would benefit from systemic chemotherapy. If low risk, no need of chemotherapy.  Return to clinic after surgery to discuss final pathology report and then determine if Oncotype DX testing will need to be sent.

## 2020-09-03 ENCOUNTER — Telehealth: Payer: Self-pay | Admitting: *Deleted

## 2020-09-03 ENCOUNTER — Encounter: Payer: Self-pay | Admitting: *Deleted

## 2020-09-03 DIAGNOSIS — M25511 Pain in right shoulder: Secondary | ICD-10-CM | POA: Diagnosis not present

## 2020-09-03 DIAGNOSIS — M19011 Primary osteoarthritis, right shoulder: Secondary | ICD-10-CM | POA: Diagnosis not present

## 2020-09-03 NOTE — Telephone Encounter (Signed)
Left message for a return phone call to follow up from new pt. Appointment and assess navigation needs.

## 2020-09-04 ENCOUNTER — Telehealth: Payer: Self-pay | Admitting: Licensed Clinical Social Worker

## 2020-09-04 ENCOUNTER — Telehealth: Payer: Self-pay | Admitting: Hematology and Oncology

## 2020-09-04 NOTE — Telephone Encounter (Signed)
Scheduled appointment per 07/22 los. Patient is aware.  

## 2020-09-04 NOTE — Telephone Encounter (Signed)
[facility] Work  Clinical Social Work was referred by new patient protocol for assessment of psychosocial needs.  Clinical Social Worker  attempted to contact patient by phone   to offer support and assess for needs.   No answer. Left VM introducing support services. Provided direct contact information for this CSW.     [place], [place] Worker [employer]

## 2020-09-07 ENCOUNTER — Ambulatory Visit: Payer: Medicare HMO | Admitting: Rehabilitation

## 2020-09-10 ENCOUNTER — Encounter: Payer: Self-pay | Admitting: *Deleted

## 2020-09-11 DIAGNOSIS — M19011 Primary osteoarthritis, right shoulder: Secondary | ICD-10-CM | POA: Diagnosis not present

## 2020-09-11 DIAGNOSIS — M25511 Pain in right shoulder: Secondary | ICD-10-CM | POA: Diagnosis not present

## 2020-09-20 NOTE — Progress Notes (Signed)
Surgical Instructions    Your procedure is scheduled on Wednesday August 17th.  Report to [facility] Main Entrance "A" at 8 A.M., then check in with the Admitting office.  Call this number if you have problems the morning of surgery:  [phone]   If you have any questions prior to your surgery date call [phone]: Open Monday-Friday 8am-4pm    Remember:  Do not eat after midnight the night before your surgery  You may drink clear liquids until 7am the morning of your surgery.   Clear liquids allowed are: Water, Non-Citrus Juices (without pulp), Carbonated Beverages, Clear Tea, Black Coffee Only, and Gatorade    Take these medicines the morning of surgery with A SIP OF WATER  atorvastatin (LIPITOR) 20 MG tablet metoprolol tartrate (LOPRESSOR) 25 MG tablet if needed  WHAT DO I DO ABOUT MY DIABETES MEDICATION?   Do not take oral diabetes medicines (Metformin) the morning of surgery.    HOW TO MANAGE YOUR DIABETES BEFORE AND AFTER SURGERY  Why is it important to control my blood sugar before and after surgery? Improving blood sugar levels before and after surgery helps healing and can limit problems. A way of improving blood sugar control is eating a healthy diet by:  Eating less sugar and carbohydrates  Increasing activity/exercise  Talking with your doctor about reaching your blood sugar goals High blood sugars (greater than 180 mg/dL) can raise your risk of infections and slow your recovery, so you will need to focus on controlling your diabetes during the weeks before surgery. Make sure that the doctor who takes care of your diabetes knows about your planned surgery including the date and location.  How do I manage my blood sugar before surgery? Check your blood sugar at least 4 times a day, starting 2 days before surgery, to make sure that the level is not too high or low.  Check your blood sugar the morning of your surgery when you wake up and every 2 hours until  you get to the Short Stay unit.  If your blood sugar is less than 70 mg/dL, you will need to treat for low blood sugar: Do not take insulin. Treat a low blood sugar (less than 70 mg/dL) with  cup of clear juice (cranberry or apple), 4 glucose tablets, OR glucose gel. Recheck blood sugar in 15 minutes after treatment (to make sure it is greater than 70 mg/dL). If your blood sugar is not greater than 70 mg/dL on recheck, call [phone] for further instructions. Report your blood sugar to the short stay nurse when you get to Short Stay.  If you are admitted to the hospital after surgery: Your blood sugar will be checked by the staff and you will probably be given insulin after surgery (instead of oral diabetes medicines) to make sure you have good blood sugar levels. The goal for blood sugar control after surgery is 80-180 mg/dL.    As of today, STOP taking any Aspirin (unless otherwise instructed by your surgeon) Aleve, Naproxen, Ibuprofen, Motrin, Advil, Goody's, BC's, all herbal medications, fish oil, and all vitamins.          Do not wear jewelry or makeup Do not wear lotions, powders, perfumes, or deodorant. Do not shave 48 hours prior to surgery.   Do not bring valuables to the hospital. DO Not wear nail polish, gel polish, artificial nails, or any other type of covering on  natural nails including finger and toenails. If patients have artificial nails, gel  coating, etc. that need to be removed by a nail salon please have this removed prior to surgery or surgery may need to be canceled/delayed if the surgeon/ anesthesia feels like the patient is unable to be adequately monitored.             [facility] is not responsible for any belongings or valuables.  Do NOT Smoke (Tobacco/Vaping) or drink Alcohol 24 hours prior to your procedure If you use a CPAP at night, you may bring all equipment for your overnight stay.   Contacts, glasses, dentures or bridgework may not be worn into  surgery, please bring cases for these belongings   For patients admitted to the hospital, discharge time will be determined by your treatment team.   Patients discharged the day of surgery will not be allowed to drive home, and someone needs to stay with them for 24 hours.  ONLY 1 SUPPORT PERSON MAY BE PRESENT WHILE YOU ARE IN SURGERY. IF YOU ARE TO BE ADMITTED ONCE YOU ARE IN YOUR ROOM YOU WILL BE ALLOWED TWO (2) VISITORS.  Minor children may have two parents present. Special consideration for safety and communication needs will be reviewed on a case by case basis.  Special instructions:    Oral Hygiene is also important to reduce your risk of infection.  Remember - BRUSH YOUR TEETH THE MORNING OF SURGERY WITH YOUR REGULAR TOOTHPASTE   - Preparing For Surgery  Before surgery, you can play an important role. Because skin is not sterile, your skin needs to be as free of germs as possible. You can reduce the number of germs on your skin by washing with CHG (chlorahexidine gluconate) Soap before surgery.  CHG is an antiseptic cleaner which kills germs and bonds with the skin to continue killing germs even after washing.     Please do not use if you have an allergy to CHG or antibacterial soaps. If your skin becomes reddened/irritated stop using the CHG.  Do not shave (including legs and underarms) for at least 48 hours prior to first CHG shower. It is OK to shave your face.  Please follow these instructions carefully.     Shower the NIGHT BEFORE SURGERY and the MORNING OF SURGERY with CHG Soap.   If you chose to wash your hair, wash your hair first as usual with your normal shampoo. After you shampoo, rinse your hair and body thoroughly to remove the shampoo.  Then [employer] and genitals (private parts) with your normal soap and rinse thoroughly to remove soap.  After that Use CHG Soap as you would any other liquid soap. You can apply CHG directly to the skin and wash gently with a  scrungie or a clean washcloth.   Apply the CHG Soap to your body ONLY FROM THE NECK DOWN.  Do not use on open wounds or open sores. Avoid contact with your eyes, ears, mouth and genitals (private parts). Wash Face and genitals (private parts)  with your normal soap.   Wash thoroughly, paying special attention to the area where your surgery will be performed.  Thoroughly rinse your body with warm water from the neck down.  DO NOT shower/wash with your normal soap after using and rinsing off the CHG Soap.  Pat yourself dry with a CLEAN TOWEL.  Wear CLEAN PAJAMAS to bed the night before surgery  Place CLEAN SHEETS on your bed the night before your surgery  DO NOT SLEEP WITH PETS.   Day of Surgery:  Take a shower with CHG soap. Wear Clean/Comfortable clothing the morning of surgery Do not apply any deodorants/lotions.   Remember to brush your teeth WITH YOUR REGULAR TOOTHPASTE.   Please read over the following fact sheets that you were given.

## 2020-09-21 ENCOUNTER — Encounter (HOSPITAL_COMMUNITY): Payer: Self-pay

## 2020-09-21 ENCOUNTER — Encounter (HOSPITAL_COMMUNITY)
Admission: RE | Admit: 2020-09-21 | Discharge: 2020-09-21 | Disposition: A | Discharge status: Home / Self Care | Payer: Medicare HMO | Source: Ambulatory Visit | Attending: General Surgery | Admitting: General Surgery

## 2020-09-21 ENCOUNTER — Other Ambulatory Visit: Payer: Self-pay

## 2020-09-21 ENCOUNTER — Ambulatory Visit
Admission: RE | Admit: 2020-09-21 | Discharge: 2020-09-21 | Disposition: A | Discharge status: Home / Self Care | Payer: Medicare HMO | Source: Ambulatory Visit | Attending: General Surgery | Admitting: General Surgery

## 2020-09-21 DIAGNOSIS — Z01812 Encounter for preprocedural laboratory examination: Secondary | ICD-10-CM | POA: Diagnosis not present

## 2020-09-21 DIAGNOSIS — N6489 Other specified disorders of breast: Secondary | ICD-10-CM | POA: Diagnosis not present

## 2020-09-21 DIAGNOSIS — C50212 Malignant neoplasm of upper-inner quadrant of left female breast: Secondary | ICD-10-CM

## 2020-09-21 DIAGNOSIS — Z170 Estrogen receptor positive status [ER+]: Secondary | ICD-10-CM

## 2020-09-21 HISTORY — DX: Bursitis of right shoulder: M75.51

## 2020-09-21 LAB — BASIC METABOLIC PANEL
Anion gap: 8 (ref 5–15)
BUN: 17 mg/dL (ref 8–23)
CO2: 28 mmol/L (ref 22–32)
Calcium: 9.4 mg/dL (ref 8.9–10.3)
Chloride: 104 mmol/L (ref 98–111)
Creatinine, Ser: 0.79 mg/dL (ref 0.44–1.00)
GFR, Estimated: >60 mL/min (ref >60)
Glucose, Bld: 149 mg/dL — ABNORMAL HIGH (ref 70–99)
Potassium: 4.2 mmol/L (ref 3.5–5.1)
Sodium: 140 mmol/L (ref 135–145)

## 2020-09-21 LAB — CBC
HCT: 42.7 % (ref 36.0–46.0)
Hemoglobin: 13.5 g/dL (ref 12.0–15.0)
MCH: 30.6 pg (ref 26.0–34.0)
MCHC: 31.6 g/dL (ref 30.0–36.0)
MCV: 96.8 fL (ref 80.0–100.0)
Platelets: 324 10*3/uL (ref 150–400)
RBC: 4.41 MIL/uL (ref 3.87–5.11)
RDW: 12.4 % (ref 11.5–15.5)
WBC: 10.2 10*3/uL (ref 4.0–10.5)
nRBC: 0 % (ref 0.0–0.2)

## 2020-09-21 LAB — HEMOGLOBIN A1C
Hgb A1c MFr Bld: 7.2 % — ABNORMAL HIGH (ref 4.8–5.6)
Mean Plasma Glucose: 159.94 mg/dL

## 2020-09-21 LAB — GLUCOSE, CAPILLARY: Glucose-Capillary: 167 mg/dL — ABNORMAL HIGH (ref 70–99)

## 2020-09-21 NOTE — Progress Notes (Signed)
PCP: Dr. Danise Mina Cardiologist: Denies (saw MD once, but not needed to follow up per pt)  EKG: 03-08-20 (Unconfirmed, but no need to repeat per Brandy Guerrero with anesthesia) CXR: n/a ECHO: Yes-Pt can't remember date or location, unable to find results Stress Test: Yes-Pt can't remember date or location, unable to find results Cardiac Cath: denies  Fasting Blood Sugar- "Pre-diabetic", but takes metformin daily.  Does not check sugar  Requested to have nail polish removed.  Requisition provided for Covid testing on Monday August 15  Patient denies shortness of breath, fever, cough, and chest pain at PAT appointment.  Patient verbalized understanding of instructions provided today at the PAT appointment.  Patient asked to review instructions at home and day of surgery.

## 2020-09-25 ENCOUNTER — Other Ambulatory Visit: Payer: Self-pay

## 2020-09-25 ENCOUNTER — Ambulatory Visit
Admission: RE | Admit: 2020-09-25 | Discharge: 2020-09-25 | Disposition: A | Discharge status: Home / Self Care | Payer: Medicare HMO | Source: Ambulatory Visit | Attending: General Surgery | Admitting: General Surgery

## 2020-09-25 DIAGNOSIS — C50912 Malignant neoplasm of unspecified site of left female breast: Secondary | ICD-10-CM | POA: Diagnosis not present

## 2020-09-25 DIAGNOSIS — Z170 Estrogen receptor positive status [ER+]: Secondary | ICD-10-CM

## 2020-09-25 DIAGNOSIS — C50212 Malignant neoplasm of upper-inner quadrant of left female breast: Secondary | ICD-10-CM

## 2020-09-26 ENCOUNTER — Ambulatory Visit (HOSPITAL_COMMUNITY): Payer: Medicare HMO | Admitting: Physician Assistant

## 2020-09-26 ENCOUNTER — Ambulatory Visit
Admission: RE | Admit: 2020-09-26 | Discharge: 2020-09-26 | Disposition: A | Discharge status: Home / Self Care | Payer: Medicare HMO | Source: Ambulatory Visit | Attending: General Surgery | Admitting: General Surgery

## 2020-09-26 ENCOUNTER — Encounter (HOSPITAL_COMMUNITY): Payer: Self-pay | Admitting: General Surgery

## 2020-09-26 ENCOUNTER — Ambulatory Visit (HOSPITAL_COMMUNITY)
Admission: RE | Admit: 2020-09-26 | Discharge: 2020-09-27 | Disposition: A | Discharge status: Home / Self Care | Payer: Medicare HMO | Attending: General Surgery | Admitting: General Surgery

## 2020-09-26 ENCOUNTER — Encounter (HOSPITAL_COMMUNITY)
Admission: RE | Admit: 2020-09-26 | Discharge: 2020-09-26 | Disposition: A | Discharge status: Home / Self Care | Payer: Medicare HMO | Source: Ambulatory Visit | Attending: General Surgery | Admitting: General Surgery

## 2020-09-26 ENCOUNTER — Encounter (HOSPITAL_COMMUNITY)
Admission: RE | Disposition: A | Discharge status: Home / Self Care | Payer: Self-pay | Source: Home / Self Care | Attending: General Surgery

## 2020-09-26 DIAGNOSIS — C50412 Malignant neoplasm of upper-outer quadrant of left female breast: Secondary | ICD-10-CM | POA: Diagnosis not present

## 2020-09-26 DIAGNOSIS — R928 Other abnormal and inconclusive findings on diagnostic imaging of breast: Secondary | ICD-10-CM | POA: Diagnosis not present

## 2020-09-26 DIAGNOSIS — C50912 Malignant neoplasm of unspecified site of left female breast: Secondary | ICD-10-CM | POA: Insufficient documentation

## 2020-09-26 DIAGNOSIS — C50212 Malignant neoplasm of upper-inner quadrant of left female breast: Secondary | ICD-10-CM | POA: Insufficient documentation

## 2020-09-26 DIAGNOSIS — G8918 Other acute postprocedural pain: Secondary | ICD-10-CM | POA: Diagnosis not present

## 2020-09-26 DIAGNOSIS — Z170 Estrogen receptor positive status [ER+]: Secondary | ICD-10-CM | POA: Insufficient documentation

## 2020-09-26 DIAGNOSIS — Z7984 Long term (current) use of oral hypoglycemic drugs: Secondary | ICD-10-CM | POA: Insufficient documentation

## 2020-09-26 DIAGNOSIS — E119 Type 2 diabetes mellitus without complications: Secondary | ICD-10-CM | POA: Insufficient documentation

## 2020-09-26 HISTORY — PX: BREAST LUMPECTOMY WITH RADIOACTIVE SEED AND SENTINEL LYMPH NODE BIOPSY: SHX6550

## 2020-09-26 HISTORY — PX: BREAST LUMPECTOMY: SHX2

## 2020-09-26 LAB — GLUCOSE, CAPILLARY
Glucose-Capillary: 157 mg/dL — ABNORMAL HIGH (ref 70–99)
Glucose-Capillary: 242 mg/dL — ABNORMAL HIGH (ref 70–99)
Glucose-Capillary: 186 mg/dL — ABNORMAL HIGH (ref 70–99)

## 2020-09-26 SURGERY — BREAST LUMPECTOMY WITH RADIOACTIVE SEED AND SENTINEL LYMPH NODE BIOPSY
Anesthesia: General | Site: Breast | Laterality: Left

## 2020-09-26 MED ORDER — PROPOFOL 10 MG/ML IV BOLUS
INTRAVENOUS | Status: DC | PRN
  Administered 2020-09-26: 20 mg via INTRAVENOUS
  Administered 2020-09-26: 200 mg via INTRAVENOUS

## 2020-09-26 MED ORDER — FENTANYL CITRATE (PF) 100 MCG/2ML IJ SOLN
INTRAMUSCULAR | Status: AC
  Administered 2020-09-26: 50 ug via INTRAVENOUS
  Filled 2020-09-26: qty 2

## 2020-09-26 MED ORDER — GABAPENTIN 300 MG PO CAPS
ORAL_CAPSULE | ORAL | Status: AC
  Administered 2020-09-26: 300 mg via ORAL
  Filled 2020-09-26: qty 1

## 2020-09-26 MED ORDER — LACTATED RINGERS IV SOLN: INTRAVENOUS | Status: DC | PRN

## 2020-09-26 MED ORDER — ACETAMINOPHEN 500 MG PO TABS
ORAL_TABLET | ORAL | Status: AC
  Administered 2020-09-26: 1000 mg via ORAL
  Filled 2020-09-26: qty 2

## 2020-09-26 MED ORDER — GABAPENTIN 300 MG PO CAPS: 300.0000 mg | ORAL_CAPSULE | ORAL | Status: AC

## 2020-09-26 MED ORDER — HEPARIN SODIUM (PORCINE) 5000 UNIT/ML IJ SOLN
5000.0000 [IU] | Freq: Three times a day (TID) | INTRAMUSCULAR | Status: DC
  Administered 2020-09-27: 5000 [IU] via SUBCUTANEOUS
  Filled 2020-09-26: qty 1

## 2020-09-26 MED ORDER — CHLORHEXIDINE GLUCONATE CLOTH 2 % EX PADS
6.0000 | MEDICATED_PAD | Freq: Once | CUTANEOUS | Status: DC
Start: 2020-09-26 — End: 2020-09-26

## 2020-09-26 MED ORDER — ATORVASTATIN CALCIUM 10 MG PO TABS
20.0000 mg | ORAL_TABLET | Freq: Every day | ORAL | Status: DC
  Administered 2020-09-27: 20 mg via ORAL
  Filled 2020-09-26: qty 2

## 2020-09-26 MED ORDER — ONDANSETRON HCL 4 MG/2ML IJ SOLN
INTRAMUSCULAR | Status: AC
  Filled 2020-09-26: qty 2

## 2020-09-26 MED ORDER — FENTANYL CITRATE (PF) 100 MCG/2ML IJ SOLN
INTRAMUSCULAR | Status: DC | PRN
  Administered 2020-09-26 (×2): 25 ug via INTRAVENOUS
  Administered 2020-09-26: 50 ug via INTRAVENOUS
  Administered 2020-09-26 (×2): 25 ug via INTRAVENOUS

## 2020-09-26 MED ORDER — ONDANSETRON HCL 4 MG/2ML IJ SOLN
INTRAMUSCULAR | Status: DC | PRN
  Administered 2020-09-26: 4 mg via INTRAVENOUS

## 2020-09-26 MED ORDER — ACETAMINOPHEN 160 MG/5ML PO SOLN: 325.0000 mg | ORAL | Status: DC | PRN

## 2020-09-26 MED ORDER — ACETAMINOPHEN 10 MG/ML IV SOLN: 1000.0000 mg | Freq: Once | INTRAVENOUS | Status: DC | PRN

## 2020-09-26 MED ORDER — FENTANYL CITRATE (PF) 100 MCG/2ML IJ SOLN
25.0000 ug | INTRAMUSCULAR | Status: DC | PRN
  Administered 2020-09-26: 25 ug via INTRAVENOUS

## 2020-09-26 MED ORDER — FENTANYL CITRATE (PF) 100 MCG/2ML IJ SOLN
INTRAMUSCULAR | Status: AC
  Administered 2020-09-26: 25 ug
  Filled 2020-09-26: qty 2

## 2020-09-26 MED ORDER — METOPROLOL TARTRATE 25 MG PO TABS: 25.0000 mg | ORAL_TABLET | Freq: Two times a day (BID) | ORAL | Status: DC | PRN

## 2020-09-26 MED ORDER — INSULIN ASPART 100 UNIT/ML IJ SOLN
0.0000 [IU] | Freq: Three times a day (TID) | INTRAMUSCULAR | Status: DC
  Administered 2020-09-26: 3 [IU] via SUBCUTANEOUS
  Administered 2020-09-27: 2 [IU] via SUBCUTANEOUS

## 2020-09-26 MED ORDER — MIDAZOLAM HCL 2 MG/2ML IJ SOLN
INTRAMUSCULAR | Status: AC
  Administered 2020-09-26: 1 mg via INTRAVENOUS
  Filled 2020-09-26: qty 2

## 2020-09-26 MED ORDER — CHLORHEXIDINE GLUCONATE 0.12 % MT SOLN
OROMUCOSAL | Status: AC
  Administered 2020-09-26: 15 mL via OROMUCOSAL
  Filled 2020-09-26: qty 15

## 2020-09-26 MED ORDER — BUPIVACAINE-EPINEPHRINE (PF) 0.5% -1:200000 IJ SOLN
INTRAMUSCULAR | Status: DC | PRN
  Administered 2020-09-26: 30 mL

## 2020-09-26 MED ORDER — CHLORHEXIDINE GLUCONATE CLOTH 2 % EX PADS: 6.0000 | MEDICATED_PAD | Freq: Once | CUTANEOUS | Status: DC

## 2020-09-26 MED ORDER — FENTANYL CITRATE (PF) 100 MCG/2ML IJ SOLN
50.0000 ug | Freq: Once | INTRAMUSCULAR | Status: AC
  Administered 2020-09-26: 50 ug via INTRAVENOUS

## 2020-09-26 MED ORDER — AMISULPRIDE (ANTIEMETIC) 5 MG/2ML IV SOLN: 10.0000 mg | Freq: Once | INTRAVENOUS | Status: DC | PRN

## 2020-09-26 MED ORDER — CHLORHEXIDINE GLUCONATE 0.12 % MT SOLN: 15.0000 mL | Freq: Once | OROMUCOSAL | Status: AC

## 2020-09-26 MED ORDER — LACTATED RINGERS IV SOLN: INTRAVENOUS | Status: DC

## 2020-09-26 MED ORDER — ONDANSETRON HCL 4 MG/2ML IJ SOLN
4.0000 mg | Freq: Four times a day (QID) | INTRAMUSCULAR | Status: DC | PRN
  Administered 2020-09-26: 4 mg via INTRAVENOUS
  Filled 2020-09-26: qty 2

## 2020-09-26 MED ORDER — ACETAMINOPHEN 325 MG PO TABS: 325.0000 mg | ORAL_TABLET | ORAL | Status: DC | PRN

## 2020-09-26 MED ORDER — CEFAZOLIN SODIUM-DEXTROSE 2-4 GM/100ML-% IV SOLN
2.0000 g | INTRAVENOUS | Status: AC
  Administered 2020-09-26: 2 g via INTRAVENOUS

## 2020-09-26 MED ORDER — ONDANSETRON 4 MG PO TBDP: 4.0000 mg | ORAL_TABLET | Freq: Four times a day (QID) | ORAL | Status: DC | PRN

## 2020-09-26 MED ORDER — METFORMIN HCL 500 MG PO TABS
500.0000 mg | ORAL_TABLET | Freq: Every day | ORAL | Status: DC
  Administered 2020-09-27: 500 mg via ORAL
  Filled 2020-09-26: qty 1

## 2020-09-26 MED ORDER — DEXAMETHASONE SODIUM PHOSPHATE 10 MG/ML IJ SOLN
INTRAMUSCULAR | Status: DC | PRN
  Administered 2020-09-26: 4 mg via INTRAVENOUS

## 2020-09-26 MED ORDER — BUPIVACAINE-EPINEPHRINE (PF) 0.25% -1:200000 IJ SOLN
INTRAMUSCULAR | Status: AC
  Filled 2020-09-26: qty 30

## 2020-09-26 MED ORDER — LATANOPROST 0.005 % OP SOLN
1.0000 [drp] | Freq: Every day | OPHTHALMIC | Status: DC
  Administered 2020-09-26: 1 [drp] via OPHTHALMIC
  Filled 2020-09-26: qty 2.5

## 2020-09-26 MED ORDER — METHYLENE BLUE 0.5 % INJ SOLN
INTRAVENOUS | Status: AC
  Filled 2020-09-26: qty 10

## 2020-09-26 MED ORDER — BUPIVACAINE-EPINEPHRINE 0.25% -1:200000 IJ SOLN
INTRAMUSCULAR | Status: DC | PRN
  Administered 2020-09-26: 5 mL
  Administered 2020-09-26: 15 mL

## 2020-09-26 MED ORDER — OXYCODONE HCL 5 MG/5ML PO SOLN: 5.0000 mg | Freq: Once | ORAL | Status: DC | PRN

## 2020-09-26 MED ORDER — LIDOCAINE 2% (20 MG/ML) 5 ML SYRINGE
INTRAMUSCULAR | Status: DC | PRN
  Administered 2020-09-26: 50 mg via INTRAVENOUS

## 2020-09-26 MED ORDER — SODIUM CHLORIDE 0.9 % IV SOLN: INTRAVENOUS | Status: DC

## 2020-09-26 MED ORDER — HYDROCODONE-ACETAMINOPHEN 5-325 MG PO TABS
1.0000 | ORAL_TABLET | ORAL | Status: DC | PRN
  Administered 2020-09-26 – 2020-09-27 (×4): 2 via ORAL
  Filled 2020-09-26 (×4): qty 2

## 2020-09-26 MED ORDER — PANTOPRAZOLE SODIUM 40 MG IV SOLR
40.0000 mg | Freq: Every day | INTRAVENOUS | Status: DC
  Administered 2020-09-26: 40 mg via INTRAVENOUS
  Filled 2020-09-26: qty 40

## 2020-09-26 MED ORDER — ACETAMINOPHEN 10 MG/ML IV SOLN
INTRAVENOUS | Status: AC
  Administered 2020-09-26: 1000 mg via INTRAVENOUS
  Filled 2020-09-26: qty 100

## 2020-09-26 MED ORDER — ORAL CARE MOUTH RINSE: 15.0000 mL | Freq: Once | OROMUCOSAL | Status: AC

## 2020-09-26 MED ORDER — MIDAZOLAM HCL 2 MG/2ML IJ SOLN: 1.0000 mg | Freq: Once | INTRAMUSCULAR | Status: AC

## 2020-09-26 MED ORDER — MORPHINE SULFATE (PF) 2 MG/ML IV SOLN
1.0000 mg | INTRAVENOUS | Status: DC | PRN
  Administered 2020-09-26: 2 mg via INTRAVENOUS
  Filled 2020-09-26: qty 1

## 2020-09-26 MED ORDER — TECHNETIUM TC 99M TILMANOCEPT KIT
1.0000 | PACK | Freq: Once | INTRAVENOUS | Status: AC | PRN
  Administered 2020-09-26: 1 via INTRADERMAL

## 2020-09-26 MED ORDER — DEXAMETHASONE SODIUM PHOSPHATE 10 MG/ML IJ SOLN
INTRAMUSCULAR | Status: AC
  Filled 2020-09-26: qty 1

## 2020-09-26 MED ORDER — FENTANYL CITRATE (PF) 250 MCG/5ML IJ SOLN
INTRAMUSCULAR | Status: AC
  Filled 2020-09-26: qty 5

## 2020-09-26 MED ORDER — OXYCODONE HCL 5 MG PO TABS: 5.0000 mg | ORAL_TABLET | Freq: Once | ORAL | Status: DC | PRN

## 2020-09-26 MED ORDER — ACETAMINOPHEN 500 MG PO TABS: 1000.0000 mg | ORAL_TABLET | ORAL | Status: AC

## 2020-09-26 MED ORDER — LIDOCAINE 2% (20 MG/ML) 5 ML SYRINGE
INTRAMUSCULAR | Status: AC
  Filled 2020-09-26: qty 5

## 2020-09-26 MED ORDER — PROPOFOL 10 MG/ML IV BOLUS
INTRAVENOUS | Status: AC
  Filled 2020-09-26: qty 40

## 2020-09-26 MED ORDER — CEFAZOLIN SODIUM-DEXTROSE 2-4 GM/100ML-% IV SOLN
INTRAVENOUS | Status: AC
  Filled 2020-09-26: qty 100

## 2020-09-26 MED ORDER — HYDROCODONE-ACETAMINOPHEN 5-325 MG PO TABS: 1.0000 | ORAL_TABLET | Freq: Four times a day (QID) | ORAL | 0 refills | Status: DC | PRN

## 2020-09-26 MED ORDER — 0.9 % SODIUM CHLORIDE (POUR BTL) OPTIME
TOPICAL | Status: DC | PRN
  Administered 2020-09-26: 1000 mL

## 2020-09-26 MED ORDER — PROMETHAZINE HCL 25 MG/ML IJ SOLN: 6.2500 mg | INTRAMUSCULAR | Status: DC | PRN

## 2020-09-26 SURGICAL SUPPLY — 38 items
ADH SKN CLS APL DERMABOND .7 (GAUZE/BANDAGES/DRESSINGS) ×1
APL PRP STRL LF DISP 70% ISPRP (MISCELLANEOUS) ×1
APPLIER CLIP 9.375 MED OPEN (MISCELLANEOUS) ×2
APR CLP MED 9.3 20 MLT OPN (MISCELLANEOUS) ×1
BAG COUNTER SPONGE SURGICOUNT (BAG) IMPLANT
BINDER BREAST LRG (GAUZE/BANDAGES/DRESSINGS) IMPLANT
BINDER BREAST XLRG (GAUZE/BANDAGES/DRESSINGS) IMPLANT
CANISTER SUCT 3000ML PPV (MISCELLANEOUS) ×2 IMPLANT
CHLORAPREP W/TINT 26 (MISCELLANEOUS) ×2 IMPLANT
CLIP APPLIE 9.375 MED OPEN (MISCELLANEOUS) ×1 IMPLANT
CNTNR URN SCR LID CUP LEK RST (MISCELLANEOUS) ×1 IMPLANT
CONT SPEC 4OZ STRL OR WHT (MISCELLANEOUS) ×2
COVER PROBE W GEL 5X96 (DRAPES) ×2 IMPLANT
COVER SURGICAL LIGHT HANDLE (MISCELLANEOUS) ×2 IMPLANT
DERMABOND ADVANCED (GAUZE/BANDAGES/DRESSINGS) ×1
DERMABOND ADVANCED .7 DNX12 (GAUZE/BANDAGES/DRESSINGS) ×1 IMPLANT
DEVICE DUBIN SPECIMEN MAMMOGRA (MISCELLANEOUS) ×2 IMPLANT
DRAPE CHEST BREAST 15X10 FENES (DRAPES) ×2 IMPLANT
ELECT COATED BLADE 2.86 ST (ELECTRODE) ×2 IMPLANT
ELECT REM PT RETURN 9FT ADLT (ELECTROSURGICAL) ×2
ELECTRODE REM PT RTRN 9FT ADLT (ELECTROSURGICAL) ×1 IMPLANT
GLOVE SURG ENC MOIS LTX SZ7.5 (GLOVE) ×4 IMPLANT
GOWN STRL REUS W/ TWL LRG LVL3 (GOWN DISPOSABLE) ×2 IMPLANT
GOWN STRL REUS W/TWL LRG LVL3 (GOWN DISPOSABLE) ×4
KIT BASIN OR (CUSTOM PROCEDURE TRAY) ×2 IMPLANT
KIT MARKER MARGIN INK (KITS) ×2 IMPLANT
LIGHT WAVEGUIDE WIDE FLAT (MISCELLANEOUS) IMPLANT
NEEDLE 18GX1X1/2 (RX/OR ONLY) (NEEDLE) IMPLANT
NEEDLE FILTER BLUNT 18X 1/2SAF (NEEDLE)
NEEDLE FILTER BLUNT 18X1 1/2 (NEEDLE) IMPLANT
NEEDLE HYPO 25GX1X1/2 BEV (NEEDLE) ×2 IMPLANT
NS IRRIG 1000ML POUR BTL (IV SOLUTION) ×2 IMPLANT
PACK GENERAL/GYN (CUSTOM PROCEDURE TRAY) ×2 IMPLANT
SUT MNCRL AB 4-0 PS2 18 (SUTURE) ×4 IMPLANT
SUT VIC AB 3-0 SH 18 (SUTURE) ×2 IMPLANT
SYR CONTROL 10ML LL (SYRINGE) ×2 IMPLANT
TOWEL GREEN STERILE (TOWEL DISPOSABLE) ×2 IMPLANT
TOWEL GREEN STERILE FF (TOWEL DISPOSABLE) ×2 IMPLANT

## 2020-09-26 NOTE — H&P (Signed)
PROVIDER:  Landry Corporal, MD   MRN: [id number] DOB: 26-Mar-1950   Subjective    Chief Complaint: Breast Cancer       History of Present Illness: Brandy Guerrero is a 70 y.o. female who is seen today as an office consultation at the request of Dr. Dimas Aguas for evaluation of Breast Cancer .     The patient is a 70 year old black female who recently went for a routine screening mammogram.  At that time she was found to have a 7 mm mass in the medial aspect of the left breast.  The axilla looked negative.  The mass was biopsied and came back as a grade 2 invasive ductal cancer that was ER and PR positive and HER2 negative with a Ki-67 of 10%.  She does have a history of atypical ductal hyperplasia removed from the same breast in 2018     Review of Systems: A complete review of systems was obtained from the patient.  I have reviewed this information and discussed as appropriate with the patient.  See HPI as well for other ROS.   Review of Systems  Constitutional: Negative.   HENT: Negative.   Eyes: Negative.   Respiratory: Negative.   Cardiovascular: Negative.   Gastrointestinal: Negative.   Genitourinary: Negative.   Musculoskeletal: Negative.   Skin: Negative.   Neurological: Negative.   Endo/Heme/Allergies: Negative.   Psychiatric/Behavioral: Negative.         Medical History: Past Medical History  History reviewed. No pertinent past medical history.     There is no problem list on file for this patient.     Past Surgical History  History reviewed. No pertinent surgical history.      Allergies      Allergies  Allergen Reactions   Sulfa (Sulfonamide Antibiotics) Rash              Current Outpatient Medications on File Prior to Visit  Medication Sig Dispense Refill   metoprolol tartrate (LOPRESSOR) 25 MG tablet Take by mouth       metFORMIN (GLUCOPHAGE) 500 MG tablet Take 500 mg by mouth daily with breakfast        No current facility-administered  medications on file prior to visit.      Family History       Family History  Problem Relation Age of Onset   Hyperlipidemia (Elevated cholesterol) Brother     High blood pressure (Hypertension) Brother          Social History       Tobacco Use  Smoking Status Never Smoker  Smokeless Tobacco Never Used      Social History  Social History        Socioeconomic History   Marital status: Married  Tobacco Use   Smoking status: Never Smoker   Smokeless tobacco: Never Used  Scientific laboratory technician Use: Never used  Substance and Sexual Activity   Drug use: Never        Objective:      There were no vitals filed for this visit.  There is no height or weight on file to calculate BMI.   Physical Exam Vitals reviewed.  Constitutional:      General: She is not in acute distress.    Appearance: Normal appearance.  HENT:     Head: Normocephalic and atraumatic.     Right Ear: External ear normal.     Left Ear: External ear normal.     Nose: Nose  normal.     Mouth/Throat:     Mouth: Mucous membranes are moist.     Pharynx: Oropharynx is clear.  Eyes:     General: No scleral icterus.    Extraocular Movements: Extraocular movements intact.     Conjunctiva/sclera: Conjunctivae normal.     Pupils: Pupils are equal, round, and reactive to light.  Cardiovascular:     Rate and Rhythm: Normal rate and regular rhythm.     Pulses: Normal pulses.     Heart sounds: Normal heart sounds.  Pulmonary:     Effort: Pulmonary effort is normal. No respiratory distress.     Breath sounds: Normal breath sounds.  Abdominal:     General: Bowel sounds are normal.     Palpations: Abdomen is soft.     Tenderness: There is no abdominal tenderness.  Musculoskeletal:        General: No swelling, tenderness or deformity. Normal range of motion.     Cervical back: Normal range of motion and neck supple.  Skin:    General: Skin is warm and dry.     Coloration: Skin is not jaundiced.   Neurological:     General: No focal deficit present.     Mental Status: She is alert and oriented to person, place, and time.  Psychiatric:        Mood and Affect: Mood normal.        Behavior: Behavior normal.     Breast: There is a 2 cm mobile palpable mass in the 12 o'clock position of the left breast.  There is no palpable mass in the location of the biopsy.  There is no palpable mass in the right breast.  There is no palpable axillary, supraclavicular, or cervical lymphadenopathy         Labs, Imaging and Diagnostic Testing: Mammogram was reviewed and the findings are listed above   Assessment and Plan:  Diagnoses and all orders for this visit:   Malignant neoplasm of upper-inner quadrant of left breast in female, estrogen receptor positive (CMS-HCC)       The patient appears to have a 7 mm cancer in the inner aspect of the left breast with clinically negative nodes.  I have discussed with her in detail the different options for treatment and at this point she favors breast conservation which I feel is very reasonable to treat the cancer.  I have discussed with her in detail the risks and benefits of the operation as well as some of the technical aspects including the use of a radioactive seed for localization and she understands and wishes to proceed she is also a good candidate for sentinel node biopsy.  I will go ahead and refer her to medical and radiation oncology to discuss adjuvant therapy.  I will also refer her to physical therapy for preoperative lymphedema testing.  I will also have the radiologist look again with ultrasound at the palpable abnormality in the 12 o'clock position of the left breast to make sure this is just normal breast tissue.   Return for 2 weeks after surgery.

## 2020-09-26 NOTE — Interval H&P Note (Signed)
History and Physical Interval Note:  09/26/2020 9:03 AM  Brandy Guerrero  has presented today for surgery, with the diagnosis of LEFT BREAST CANCER.  The various methods of treatment have been discussed with the patient and family. After consideration of risks, benefits and other options for treatment, the patient has consented to  Procedure(s): LEFT BREAST LUMPECTOMY WITH RADIOACTIVE SEED AND SENTINEL LYMPH NODE BIOPSY (Left) as a surgical intervention.  The patient's history has been reviewed, patient examined, no change in status, stable for surgery.  I have reviewed the patient's chart and labs.  Questions were answered to the patient's satisfaction.     Autumn Messing III

## 2020-09-26 NOTE — Anesthesia Procedure Notes (Signed)
Procedure Name: LMA Insertion Date/Time: 09/26/2020 10:17 AM Performed by: Cathren Harsh, CRNA Pre-anesthesia Checklist: Patient identified, Emergency Drugs available, Suction available and Patient being monitored Patient Re-evaluated:Patient Re-evaluated prior to induction Oxygen Delivery Method: Circle System Utilized Preoxygenation: Pre-oxygenation with 100% oxygen Induction Type: IV induction LMA: LMA inserted LMA Size: 4.0 Number of attempts: 1 Airway Equipment and Method: Bite block Placement Confirmation: positive ETCO2 Tube secured with: Tape Dental Injury: Teeth and Oropharynx as per pre-operative assessment

## 2020-09-26 NOTE — Anesthesia Procedure Notes (Signed)
Anesthesia Regional Block: Pectoralis block   Pre-Anesthetic Checklist: , timeout performed,  Correct Patient, Correct Site, Correct Laterality,  Correct Procedure, Correct Position, site marked,  Risks and benefits discussed,  Surgical consent,  Pre-op evaluation,  At surgeon's request and post-op pain management  Laterality: Left  Prep: chloraprep       Needles:  Injection technique: Single-shot  Needle Type: Echogenic Stimulator Needle     Needle Length: 9cm  Needle Gauge: 21     Additional Needles:   Procedures:,,,, ultrasound used (permanent image in chart),,    Narrative:  Start time: 09/26/2020 9:20 AM End time: 09/26/2020 9:25 AM Injection made incrementally with aspirations every 5 mL.  Performed by: Personally  Anesthesiologist: Effie Berkshire, MD  Additional Notes: Patient tolerated the procedure well. Local anesthetic introduced in an incremental fashion under minimal resistance after negative aspirations. No paresthesias were elicited. After completion of the procedure, no acute issues were identified and patient continued to be monitored by RN.

## 2020-09-26 NOTE — Transfer of Care (Signed)
Immediate Anesthesia Transfer of Care Note  Patient: Brandy Guerrero  Procedure(s) Performed: LEFT BREAST LUMPECTOMY WITH RADIOACTIVE SEED AND SENTINEL LYMPH NODE BIOPSY (Left: Breast)  Patient Location: PACU  Anesthesia Type:General and Regional  Level of Consciousness: awake, drowsy, patient cooperative and responds to stimulation  Airway & Oxygen Therapy: Patient Spontanous Breathing and Patient connected to nasal cannula oxygen  Post-op Assessment: Report given to RN and Post -op Vital signs reviewed and stable  Post vital signs: Reviewed and stable  Last Vitals:  Vitals Value Taken Time  BP    Temp    Pulse    Resp    SpO2      Last Pain:  Vitals:   09/26/20 0905  TempSrc:   PainSc: 3       Patients Stated Pain Goal: 3 ([id number])  Complications: No notable events documented.

## 2020-09-26 NOTE — Anesthesia Postprocedure Evaluation (Signed)
Anesthesia Post Note  Patient: Brandy Guerrero  Procedure(s) Performed: LEFT BREAST LUMPECTOMY WITH RADIOACTIVE SEED AND SENTINEL LYMPH NODE BIOPSY (Left: Breast)     Patient location during evaluation: PACU Anesthesia Type: General and Regional Level of consciousness: awake and alert Pain management: pain level controlled Vital Signs Assessment: post-procedure vital signs reviewed and stable Respiratory status: spontaneous breathing, nonlabored ventilation, respiratory function stable and patient connected to nasal cannula oxygen Cardiovascular status: blood pressure returned to baseline and stable Postop Assessment: no apparent nausea or vomiting Anesthetic complications: no   No notable events documented.  Last Vitals:  Vitals:   09/26/20 1319 09/26/20 1330  BP: 117/64 116/63  Pulse: (!) 56 (!) 57  Resp: 19 17  Temp:    SpO2: 97% 97%    Last Pain:  Vitals:   09/26/20 1250  TempSrc:   PainSc: [name]

## 2020-09-26 NOTE — Op Note (Signed)
09/26/2020  11:22 AM  PATIENT:  Brandy Guerrero  70 y.o. female  PRE-OPERATIVE DIAGNOSIS:  LEFT BREAST CANCER  POST-OPERATIVE DIAGNOSIS:  LEFT BREAST CANCER  PROCEDURE:  Procedure(s): LEFT BREAST LUMPECTOMY WITH RADIOACTIVE SEED LOCALIZATION AND DEEP LEFT AXILLARY SENTINEL LYMPH NODE BIOPSY (Left)  SURGEON:  Surgeon(s) and Role:    * Jovita Kussmaul, MD - Primary  PHYSICIAN ASSISTANT:   ASSISTANTS: none   ANESTHESIA:   local and general  EBL:  minimal   BLOOD ADMINISTERED:none  DRAINS: none   LOCAL MEDICATIONS USED:  MARCAINE     SPECIMEN:  Source of Specimen:  left breast tissue and sentinel nodes x 3  DISPOSITION OF SPECIMEN:  PATHOLOGY  COUNTS:  YES  TOURNIQUET:  * No tourniquets in log *  DICTATION: .Dragon Dictation  After informed consent was obtained the patient was brought to the operating room and placed in the supine position on the operating table.  After adequate induction of general anesthesia the patient's left chest, breast, and axillary area were prepped with ChloraPrep, allowed to dry, and draped in usual sterile manner.  An appropriate timeout was performed.  Earlier in the day the patient underwent injection of 1 mCi of technetium sulfur colloid in the subareolar position on the left breast.  Also previously an I-125 seed was placed in the upper inner central left breast to mark an area of invasive breast cancer.  The neoprobe was initially set to technetium and there was a good signal identified in the left axilla.  This area was infiltrated with quarter percent Marcaine.  A small transversely oriented incision was made with a 15 blade knife overlying the area of radioactivity.  The incision was carried through the skin and subcutaneous tissue sharply with the electrocautery until the deep left axillary space was entered.  Blunt hemostat dissection was directed by the neoprobe.  I was able to identify 3 hot lymph nodes.  These were excised sharply with the  electrocautery and the surrounding small vessels and lymphatics were controlled with clips.  Ex vivo counts on these nodes ranged from 200 to 2000.  No other hot or palpable nodes were identified in the left axilla.  Hemostasis was achieved using the Bovie electrocautery.  The deep layer of the incision was closed with interrupted 3-0 Vicryl stitches.  The skin was then closed with a running 4-0 Monocryl subcuticular stitch.  Attention was then turned to the left breast.  The neoprobe was set to I-125 in the area of radioactivity was readily identified.  The area around this was infiltrated with quarter percent Marcaine.  A curvilinear incision was then made along the upper inner edge of the areola of the left breast with a 15 blade knife.  The incision was carried through the skin and subcutaneous tissue sharply with the electrocautery.  Dissection was then carried throughout the upper inner quadrant between the breast tissue and the subcutaneous fat and skin until the dissection was well beyond the area of the cancer.  I then removed a circular portion of breast tissue sharply with the electrocautery around the radioactive seed while checking the area of radioactivity frequently.  Once the specimen was removed it was oriented with the appropriate paint colors.  A specimen radiograph was obtained that showed the clip and seed to be near the center of the specimen.  The specimen was then sent to pathology for further evaluation.  Hemostasis was achieved using the Bovie electrocautery.  The cavity was marked with  clips.  The wound was irrigated with saline and infiltrated with more quarter percent Marcaine.  The deep layer of the incision was then closed with layers of interrupted 3-0 Vicryl stitches.  The skin was then closed with a interrupted 4-0 Monocryl subcuticular stitch.  Dermabond dressings were applied.  The patient tolerated the procedure well.  At the end of the case all needle sponge and instrument  counts were correct.  The patient was then awakened and taken recovery in stable condition.  PLAN OF CARE: Admit for overnight observation  PATIENT DISPOSITION:  PACU - hemodynamically stable.   Delay start of Pharmacological VTE agent (>24hrs) due to surgical blood loss or risk of bleeding: no

## 2020-09-26 NOTE — Anesthesia Preprocedure Evaluation (Addendum)
Anesthesia Evaluation  Patient identified by MRN, date of birth, ID band Patient awake    Reviewed: Allergy & Precautions, NPO status , Patient's Chart, lab work & pertinent test results  Airway Mallampati: III  TM Distance: >3 FB Neck ROM: Full    Dental  (+) Teeth Intact, Dental Advisory Given, Chipped,    Pulmonary neg pulmonary ROS,    breath sounds clear to auscultation       Cardiovascular negative cardio ROS   Rhythm:Regular Rate:Normal     Neuro/Psych Anxiety negative neurological ROS     GI/Hepatic negative GI ROS, Neg liver ROS,   Endo/Other  diabetes  Renal/GU negative Renal ROS     Musculoskeletal negative musculoskeletal ROS (+)   Abdominal Normal abdominal exam  (+)   Peds  Hematology   Anesthesia Other Findings - HLD  Reproductive/Obstetrics                           Anesthesia Physical Anesthesia Plan  ASA: 2  Anesthesia Plan: General   Post-op Pain Management: GA combined w/ Regional for post-op pain   Induction: Intravenous  PONV Risk Score and Plan: 4 or greater and Ondansetron, Dexamethasone, Midazolam and Scopolamine patch - Pre-op  Airway Management Planned: LMA  Additional Equipment: None  Intra-op Plan:   Post-operative Plan: Extubation in OR  Informed Consent: I have reviewed the patients History and Physical, chart, labs and discussed the procedure including the risks, benefits and alternatives for the proposed anesthesia with the patient or authorized representative who has indicated his/her understanding and acceptance.     Dental advisory given  Plan Discussed with: CRNA  Anesthesia Plan Comments:        Anesthesia Quick Evaluation

## 2020-09-26 NOTE — Plan of Care (Signed)
Na

## 2020-09-27 ENCOUNTER — Encounter (HOSPITAL_COMMUNITY): Payer: Self-pay | Admitting: General Surgery

## 2020-09-27 DIAGNOSIS — Z7984 Long term (current) use of oral hypoglycemic drugs: Secondary | ICD-10-CM | POA: Diagnosis not present

## 2020-09-27 DIAGNOSIS — Z170 Estrogen receptor positive status [ER+]: Secondary | ICD-10-CM | POA: Diagnosis not present

## 2020-09-27 DIAGNOSIS — E119 Type 2 diabetes mellitus without complications: Secondary | ICD-10-CM | POA: Diagnosis not present

## 2020-09-27 DIAGNOSIS — C50912 Malignant neoplasm of unspecified site of left female breast: Secondary | ICD-10-CM | POA: Diagnosis not present

## 2020-09-27 DIAGNOSIS — C50212 Malignant neoplasm of upper-inner quadrant of left female breast: Secondary | ICD-10-CM | POA: Diagnosis not present

## 2020-09-27 LAB — GLUCOSE, CAPILLARY
Glucose-Capillary: 184 mg/dL — ABNORMAL HIGH (ref 70–99)
Glucose-Capillary: 156 mg/dL — ABNORMAL HIGH (ref 70–99)

## 2020-09-27 MED ORDER — HYDROCODONE-ACETAMINOPHEN 5-325 MG PO TABS: 1.0000 | ORAL_TABLET | Freq: Four times a day (QID) | ORAL | 0 refills | Status: DC | PRN

## 2020-09-27 NOTE — Progress Notes (Signed)
Patient was transported via wheelchair by volunteer for discharge home; in no acute distress nor complaints of pain nor discomfort; room was checked and accounted for all her belongings; discharge instructions given to patient by RN and he verbalized understanding on the instructions given.

## 2020-09-27 NOTE — Progress Notes (Signed)
1 Day Post-Op   Subjective/Chief Complaint: Complains of soreness   Objective: Vital signs in last 24 hours: Temp:  [97.5 F (36.4 C)-98.3 F (36.8 C)] 98.3 F (36.8 C) (08/18 0723) Pulse Rate:  [56-73] 67 (08/18 0723) Resp:  [11-23] 18 (08/18 0723) BP: (109-141)/(59-79) 123/69 (08/18 0723) SpO2:  [94 %-100 %] 98 % (08/18 0723)    Intake/Output from previous day: 08/17 0701 - 08/18 0700 In: 750 [I.V.:750] Out: 5 [Blood:5] Intake/Output this shift: No intake/output data recorded.  General appearance: alert and cooperative Resp: clear to auscultation bilaterally Cardio: regular rate and rhythm GI: soft, non-tender; bowel sounds normal; no masses,  no organomegaly  Lab Results:  No results for input(s): WBC, HGB, HCT, PLT in the last 72 hours. BMET No results for input(s): NA, K, CL, CO2, GLUCOSE, BUN, CREATININE, CALCIUM in the last 72 hours. PT/INR No results for input(s): LABPROT, INR in the last 72 hours. ABG No results for input(s): PHART, HCO3 in the last 72 hours.  Invalid input(s): PCO2, PO2  Studies/Results: NM Sentinel Node Inj-No Rpt (Breast)  Result Date: 09/26/2020 Sulfur Colloid was injected by the Nuclear Medicine Technologist for sentinel lymph node localization.   MM Breast Surgical Specimen  Result Date: 09/26/2020 CLINICAL DATA:  Post lumpectomy specimen radiograph. EXAM: SPECIMEN RADIOGRAPH OF THE LEFT BREAST COMPARISON:  Previous exam(s). FINDINGS: Status post excision of the left breast. The radioactive seed and ribbon and coil biopsy marker clips are present, completely intact, and were marked for pathology. IMPRESSION: Specimen radiograph of the left breast. Electronically Signed   By: Audie Pinto M.D.   On: 09/26/2020 11:11  MM LT RADIOACTIVE SEED LOC MAMMO GUIDE  Result Date: 09/25/2020 CLINICAL DATA:  70 year old female with newly diagnosed left breast cancer. Patient presents for seed localization of the left breast. EXAM:  MAMMOGRAPHIC GUIDED RADIOACTIVE SEED LOCALIZATION OF THE LEFT BREAST COMPARISON:  Previous exam(s). FINDINGS: Patient presents for radioactive seed localization prior to left breast lumpectomy. I met with the patient and we discussed the procedure of seed localization including benefits and alternatives. We discussed the high likelihood of a successful procedure. We discussed the risks of the procedure including infection, bleeding, tissue injury and further surgery. We discussed the low dose of radioactivity involved in the procedure. Informed, written consent was given. The usual time-out protocol was performed immediately prior to the procedure. Using mammographic guidance, sterile technique, 1% lidocaine and an I-125 radioactive seed, the ribbon biopsy marking clip was localized using a medial approach. The follow-up mammogram images confirm the seed in the expected location and were marked for Dr. [name]. Follow-up survey of the patient confirms presence of the radioactive seed. Order number of I-125 seed:  [id number]. Total activity: 0.247 mCi  reference Date: 10 Sep 2020 The patient tolerated the procedure well and was released from the [facility]. She was given instructions regarding seed removal. IMPRESSION: Radioactive seed localization left breast. No apparent complications. Electronically Signed   By: Audie Pinto M.D.   On: 09/25/2020 09:26   Anti-infectives: Anti-infectives (From admission, onward)    Start     Dose/Rate Route Frequency Ordered Stop   09/26/20 0845  ceFAZolin (ANCEF) IVPB 2g/100 mL premix        2 g 200 mL/hr over 30 Minutes Intravenous On call to O.R. 09/26/20 0831 09/26/20 1018   09/26/20 0836  ceFAZolin (ANCEF) 2-4 GM/100ML-% IVPB       Note to Pharmacy: Humberto Leep   : cabinet override  09/26/20 0836 09/26/20 1022       Assessment/Plan: s/p Procedure(s): LEFT BREAST LUMPECTOMY WITH RADIOACTIVE SEED AND SENTINEL LYMPH NODE BIOPSY (Left) Advance  diet Discharge  LOS: 0 days    Autumn Messing III 09/27/2020

## 2020-09-28 LAB — SURGICAL PATHOLOGY

## 2020-10-02 ENCOUNTER — Encounter: Payer: Self-pay | Admitting: *Deleted

## 2020-10-02 DIAGNOSIS — C50412 Malignant neoplasm of upper-outer quadrant of left female breast: Secondary | ICD-10-CM

## 2020-10-02 NOTE — Assessment & Plan Note (Signed)
Diagnostic mammogram and [place] on 07/13/20 showed a 7 mm mass in the 10 o'clock position of the left breast. 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  09/26/2020:Left lumpectomy: Grade 1 IDC 0.8 cm, with DCIS low-grade, 0/5 lymph nodes, ER 90%, PR 100%, HER2 negative, Ki-67 10%  Pathology counseling: I discussed the final pathology report of the patient provided  a copy of this report. I discussed the margins.  We also discussed the final staging along with previously performed ER/PR testing.  Treatment plan: 1.  Given the fact that this is a grade 1 tumor less than a centimeter she does not need Oncotype DX testing. 2. adjuvant radiation at [facility] 3.  Followed by adjuvant antiestrogen therapy  Return to clinic after radiation is complete to start antiestrogens.

## 2020-10-02 NOTE — Progress Notes (Signed)
  HEMATOLOGY-ONCOLOGY TELEPHONE VISIT PROGRESS NOTE  I connected with Brandy Guerrero on 10/03/2020 at 10:30 AM EDT by telephone and verified that I am speaking with the correct person using two identifiers.  I discussed the limitations, risks, security and privacy concerns of performing an evaluation and management service by telephone and the availability of in person appointments.  I also discussed with the patient that there may be a patient responsible charge related to this service. The patient expressed understanding and agreed to proceed.   History of Present Illness: Brandy Guerrero is a 70 y.o. female with above-mentioned history of invasive ductal carcinoma of the left breast. She underwent a left breast lumpectomy on 09/26/20 with Dr. [name] for which the pathology grade 1 invasive ductal carcinoma with calcifications, low-grade DCIS with calcifications, and all axillary lymph nodes negative for carcinoma. She presents via telephone today for follow-up.   Oncology History  Atypical ductal hyperplasia of left breast  08/02/2020 Initial Diagnosis   Invasive ductal carcinoma of the left breast  Biopsy of left breast calcifications on 05/23/2016 showed atypical ductal hyperplasia. Diagnostic mammogram and [place] on 07/13/20 showed a 7 mm mass in the 10 o'clock position of the left breast. 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   Malignant neoplasm of upper-outer quadrant of left breast in female, estrogen receptor positive ([place])  08/02/2020 Initial Diagnosis   Screening mammogram detected left breast mass measuring 7 mm at 10 o'clock position.  Biopsy revealed grade 2 IDC with DCIS with calcifications, ER 90%, PR 100%, Ki67 10%, HER2 negative   09/26/2020 Surgery   Left lumpectomy: Grade 1 IDC 0.8 cm, with DCIS low-grade, 0/5 lymph nodes, ER 90%, PR 100%, HER2 negative, Ki-67 10%    Assessment Plan:   Malignant neoplasm of upper-outer quadrant of left breast in female, estrogen receptor positive ([place]) Diagnostic mammogram and [place] on 07/13/20 showed a 7 mm mass in the 10 o'clock position of the left breast. 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  09/26/2020:Left lumpectomy: Grade 1 IDC 0.8 cm, with DCIS low-grade, 0/5 lymph nodes, ER 90%, PR 100%, HER2 negative, Ki-67 10%  Pathology counseling: I discussed the final pathology report of the patient provided  a copy of this report. I discussed the margins.  We also discussed the final staging along with previously performed ER/PR testing.  Treatment plan: 1.  Given the fact that this is a grade 1 tumor less than a centimeter she does not need Oncotype DX testing. 2. adjuvant radiation at [facility] 3.  Followed by adjuvant antiestrogen therapy  Return to clinic after radiation is complete to start antiestrogens.    I discussed the assessment and treatment plan with the patient. The patient was provided an opportunity to ask questions and all were answered. The patient agreed with the plan and demonstrated an understanding of the instructions. The patient was advised to call back or seek an in-person evaluation if the symptoms worsen or if the condition fails to improve as anticipated.   Total time spent: 20 mins including non-face to face time and time spent for planning, charting and coordination of care  Rulon Eisenmenger, MD 10/03/2020    I, Thana Ates, am acting as scribe for Nicholas Lose, MD.  I have reviewed the above documentation for accuracy and completeness, and I agree with the above.

## 2020-10-03 ENCOUNTER — Inpatient Hospital Stay: Payer: Medicare HMO | Attending: Hematology and Oncology | Admitting: Hematology and Oncology

## 2020-10-03 DIAGNOSIS — Z170 Estrogen receptor positive status [ER+]: Secondary | ICD-10-CM

## 2020-10-03 DIAGNOSIS — C50412 Malignant neoplasm of upper-outer quadrant of left female breast: Secondary | ICD-10-CM | POA: Diagnosis not present

## 2020-10-08 DIAGNOSIS — M1712 Unilateral primary osteoarthritis, left knee: Secondary | ICD-10-CM | POA: Diagnosis not present

## 2020-10-12 ENCOUNTER — Ambulatory Visit
Admission: RE | Admit: 2020-10-12 | Discharge: 2020-10-12 | Disposition: A | Discharge status: Home / Self Care | Payer: Medicare HMO | Source: Ambulatory Visit | Attending: Radiation Oncology | Admitting: Radiation Oncology

## 2020-10-12 ENCOUNTER — Encounter: Payer: Self-pay | Admitting: Radiation Oncology

## 2020-10-12 VITALS — BP 126/67 | HR 72 | Temp 97.3°F | Wt 192.0 lb

## 2020-10-12 DIAGNOSIS — N6489 Other specified disorders of breast: Secondary | ICD-10-CM | POA: Insufficient documentation

## 2020-10-12 DIAGNOSIS — C50412 Malignant neoplasm of upper-outer quadrant of left female breast: Secondary | ICD-10-CM | POA: Insufficient documentation

## 2020-10-12 DIAGNOSIS — Z79899 Other long term (current) drug therapy: Secondary | ICD-10-CM | POA: Insufficient documentation

## 2020-10-12 DIAGNOSIS — E785 Hyperlipidemia, unspecified: Secondary | ICD-10-CM | POA: Insufficient documentation

## 2020-10-12 DIAGNOSIS — I4891 Unspecified atrial fibrillation: Secondary | ICD-10-CM | POA: Diagnosis not present

## 2020-10-12 DIAGNOSIS — D649 Anemia, unspecified: Secondary | ICD-10-CM | POA: Diagnosis not present

## 2020-10-12 DIAGNOSIS — Z170 Estrogen receptor positive status [ER+]: Secondary | ICD-10-CM | POA: Diagnosis not present

## 2020-10-12 DIAGNOSIS — Z7984 Long term (current) use of oral hypoglycemic drugs: Secondary | ICD-10-CM | POA: Diagnosis not present

## 2020-10-12 NOTE — Consult Note (Signed)
NEW PATIENT EVALUATION  Name: Brandy Guerrero  MRN: [id number]  Date:   10/12/2020     DOB: February 22, 1950   This 70 y.o. 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  REFERRING PHYSICIAN: Ria Bush, MD  CHIEF COMPLAINT:  Chief Complaint  Patient presents with   Follow-up    DIAGNOSIS: The encounter diagnosis was Malignant neoplasm of upper-outer quadrant of left breast in female, estrogen receptor positive ([place]).   PREVIOUS INVESTIGATIONS:  Pathology report reviewed Mammogram and ultrasound reviewed Clinical notes reviewed  HPI: Patient is a 70 year old female who presented with an abnormal mammogram of her left breast.  Targeted imaging there was a 7 x 7 x 6 mm irregular lesion in the 10 o'clock position left breast 5 cm from nipple confirmed on mammogram as well as ultrasound.  Ultrasound left axilla demonstrated no evidence of abnormal appearing lymph nodes.  Biopsy was positive for invasive mammary carcinoma she went on to have a wide local excision for a 0.8 cm grade 1 invasive mammary carcinoma.  Margins were clear at 4 mm.  She had DCIS margins were also clear for that of 4 mm.  5 sentinel lymph nodes were examined all negative for metastatic disease.  She has been seen by medical oncology based on the size and grade of her tumor adjuvant chemotherapy is not recommended she is now seen for radiation oncology opinion.  She is doing well she has some tenderness in her left axilla probably has a small seroma present there.  PLANNED TREATMENT REGIMEN: Hypofractionated left whole breast radiation  PAST MEDICAL HISTORY:  has a past medical history of Acne, Anemia, Bursitis of right shoulder, History of atrial fibrillation (05/11/2005), HLD (hyperlipidemia), Obesity, Panic attacks, Pelvic mass, Positive occult  stool blood test (02/11/2012), and Prediabetes.    PAST SURGICAL HISTORY:  Past Surgical History:  Procedure Laterality Date   ABDOMINAL HYSTERECTOMY  1990s   for fibroids, has one ovary remaining ([place]). partial   BREAST BIOPSY     BREAST LUMPECTOMY WITH RADIOACTIVE SEED AND SENTINEL LYMPH NODE BIOPSY Left 09/26/2020   Procedure: LEFT BREAST LUMPECTOMY WITH RADIOACTIVE SEED AND SENTINEL LYMPH NODE BIOPSY;  Surgeon: Jovita Kussmaul, MD;  Location: [place];  Service: General;  Laterality: Left;   COLONOSCOPY  01/2012   mod diverticulosis, rec rpt stool cards and if + rec EGD (returned negative), rpt colonoscopy 10 yrs    FAMILY HISTORY: family history includes Alcohol abuse in her brother and father; Diabetes in her daughter; Hypertension in her mother.  SOCIAL HISTORY:  reports that she has never smoked. She has never used smokeless tobacco. She reports current alcohol use. She reports that she does not use drugs.  ALLERGIES: Sulfa antibiotics  MEDICATIONS:  Current Outpatient Medications  Medication Sig Dispense Refill   atorvastatin (LIPITOR) 20 MG tablet Take 1 tablet by mouth once daily 90 tablet 3   HYDROcodone-acetaminophen (NORCO/VICODIN) 5-325 MG tablet Take 1-2 tablets by mouth every 6 (six) hours as needed for moderate pain or severe pain. 15 tablet 0   HYDROcodone-acetaminophen (NORCO/VICODIN) 5-325 MG tablet Take 1-2 tablets by mouth every 6 (six) hours as needed. 15 tablet 0   latanoprost (XALATAN) 0.005 % ophthalmic solution Place 1 drop into the left eye at bedtime.     metFORMIN (GLUCOPHAGE) 500 MG tablet Take 1  tablet (500 mg total) by mouth daily with breakfast. 90 tablet 3   metoprolol tartrate (LOPRESSOR) 25 MG tablet Take 1 tablet (25 mg total) by mouth 2 (two) times daily as needed. Heart fluttering (Patient taking differently: Take 25 mg by mouth 2 (two) times daily as needed (heart fluttering).) 60 tablet 3   OVER THE COUNTER MEDICATION Take 2 capsules by mouth 2  (two) times daily. [place]     No current facility-administered medications for this encounter.    ECOG PERFORMANCE STATUS:  0 - Asymptomatic  REVIEW OF SYSTEMS: Patient denies any weight loss, fatigue, weakness, fever, chills or night sweats. Patient denies any loss of vision, blurred vision. Patient denies any ringing  of the ears or hearing loss. No irregular heartbeat. Patient denies heart murmur or history of fainting. Patient denies any chest pain or pain radiating to her upper extremities. Patient denies any shortness of breath, difficulty breathing at night, cough or hemoptysis. Patient denies any swelling in the lower legs. Patient denies any nausea vomiting, vomiting of blood, or coffee ground material in the vomitus. Patient denies any stomach pain. Patient states has had normal bowel movements no significant constipation or diarrhea. Patient denies any dysuria, hematuria or significant nocturia. Patient denies any problems walking, swelling in the joints or loss of balance. Patient denies any skin changes, loss of hair or loss of weight. Patient denies any excessive worrying or anxiety or significant depression. Patient denies any problems with insomnia. Patient denies excessive thirst, polyuria, polydipsia. Patient denies any swollen glands, patient denies easy bruising or easy bleeding. Patient denies any recent infections, allergies or URI. Patient "s visual fields have not changed significantly in recent time.   PHYSICAL EXAM: BP 126/67   Pulse 72   Temp (!) 97.3 F (36.3 C) (Tympanic)   Wt 192 lb (87.1 kg)   BMI 35.12 kg/m  She status post wide local excision of the left breast around the nipple areolar complex which is well-healed.  Her left axilla has a seroma present.  This is causing some tenderness.  No dominant masses noted in either breast.  Well-developed well-nourished patient in NAD. HEENT reveals PERLA, EOMI, discs not visualized.  Oral cavity is clear. No oral mucosal  lesions are identified. Neck is clear without evidence of cervical or supraclavicular adenopathy. Lungs are clear to A&P. Cardiac examination is essentially unremarkable with regular rate and rhythm without murmur rub or thrill. Abdomen is benign with no organomegaly or masses noted. Motor sensory and DTR levels are equal and symmetric in the upper and lower extremities. Cranial nerves II through XII are grossly intact. Proprioception is intact. No peripheral adenopathy or edema is identified. No motor or sensory levels are noted. Crude visual fields are within normal range.  LABORATORY DATA: Pathology report reviewed    RADIOLOGY RESULTS: Mammogram and ultrasound reviewed compatible with above-stated findings   IMPRESSION: Stage Ia well differentiated invasive mammary carcinoma ER/PR positive of the left breast status post wide local excision and sentinel node biopsy in 70 year old female  PLAN: At this time have recommended a hypofractionated course of whole breast radiation over 3 weeks.  Would also boost her scar another 1000 cGy using electron beam.  Risks and benefits of treatment including skin reaction fatigue alteration of blood counts possible inclusion of superficial lung all were discussed in detail.  I have asked her to go back to the surgeon sooner than later for the seroma in her left axilla if this continues to bother her.  I have personally set up and ordered CT simulation.  Patient also will benefit from antiestrogen therapy after completion of radiation.  Patient comprehends my recommendations well.  I would like to take this opportunity to thank you for allowing me to participate in the care of your patient.Noreene Filbert, MD

## 2020-10-16 ENCOUNTER — Encounter: Payer: Self-pay | Admitting: *Deleted

## 2020-10-16 DIAGNOSIS — M1712 Unilateral primary osteoarthritis, left knee: Secondary | ICD-10-CM | POA: Diagnosis not present

## 2020-10-23 ENCOUNTER — Ambulatory Visit: Payer: Medicare HMO

## 2020-10-24 ENCOUNTER — Ambulatory Visit: Payer: Medicare HMO | Admitting: Family Medicine

## 2020-10-25 ENCOUNTER — Ambulatory Visit
Admission: RE | Admit: 2020-10-25 | Discharge: 2020-10-25 | Disposition: A | Discharge status: Home / Self Care | Payer: Medicare HMO | Source: Ambulatory Visit | Attending: Radiation Oncology | Admitting: Radiation Oncology

## 2020-10-25 DIAGNOSIS — Z510 Encounter for antineoplastic radiation therapy: Secondary | ICD-10-CM | POA: Insufficient documentation

## 2020-10-25 DIAGNOSIS — Z170 Estrogen receptor positive status [ER+]: Secondary | ICD-10-CM | POA: Diagnosis not present

## 2020-10-25 DIAGNOSIS — C50412 Malignant neoplasm of upper-outer quadrant of left female breast: Secondary | ICD-10-CM | POA: Insufficient documentation

## 2020-10-26 ENCOUNTER — Other Ambulatory Visit: Payer: Self-pay | Admitting: *Deleted

## 2020-10-26 DIAGNOSIS — C50412 Malignant neoplasm of upper-outer quadrant of left female breast: Secondary | ICD-10-CM

## 2020-10-29 ENCOUNTER — Encounter: Payer: Self-pay | Admitting: *Deleted

## 2020-10-29 DIAGNOSIS — C50412 Malignant neoplasm of upper-outer quadrant of left female breast: Secondary | ICD-10-CM | POA: Diagnosis not present

## 2020-10-29 DIAGNOSIS — Z170 Estrogen receptor positive status [ER+]: Secondary | ICD-10-CM | POA: Diagnosis not present

## 2020-10-29 DIAGNOSIS — Z510 Encounter for antineoplastic radiation therapy: Secondary | ICD-10-CM | POA: Diagnosis not present

## 2020-10-30 ENCOUNTER — Ambulatory Visit: Admission: RE | Admit: 2020-10-30 | Payer: Medicare HMO | Source: Ambulatory Visit

## 2020-10-30 DIAGNOSIS — C50412 Malignant neoplasm of upper-outer quadrant of left female breast: Secondary | ICD-10-CM | POA: Diagnosis not present

## 2020-10-30 DIAGNOSIS — Z170 Estrogen receptor positive status [ER+]: Secondary | ICD-10-CM | POA: Diagnosis not present

## 2020-10-30 DIAGNOSIS — M1712 Unilateral primary osteoarthritis, left knee: Secondary | ICD-10-CM | POA: Diagnosis not present

## 2020-10-30 DIAGNOSIS — Z510 Encounter for antineoplastic radiation therapy: Secondary | ICD-10-CM | POA: Diagnosis not present

## 2020-10-31 ENCOUNTER — Telehealth: Payer: Self-pay | Admitting: Hematology and Oncology

## 2020-10-31 ENCOUNTER — Ambulatory Visit
Admission: RE | Admit: 2020-10-31 | Discharge: 2020-10-31 | Disposition: A | Discharge status: Home / Self Care | Payer: Medicare HMO | Source: Ambulatory Visit | Attending: Radiation Oncology | Admitting: Radiation Oncology

## 2020-10-31 DIAGNOSIS — C50412 Malignant neoplasm of upper-outer quadrant of left female breast: Secondary | ICD-10-CM | POA: Diagnosis not present

## 2020-10-31 DIAGNOSIS — Z170 Estrogen receptor positive status [ER+]: Secondary | ICD-10-CM | POA: Diagnosis not present

## 2020-10-31 DIAGNOSIS — Z510 Encounter for antineoplastic radiation therapy: Secondary | ICD-10-CM | POA: Diagnosis not present

## 2020-10-31 NOTE — Telephone Encounter (Signed)
Scheduled per sch msg. Called and spoke with patient. Confirmed appt  

## 2020-11-01 ENCOUNTER — Ambulatory Visit
Admission: RE | Admit: 2020-11-01 | Discharge: 2020-11-01 | Disposition: A | Discharge status: Home / Self Care | Payer: Medicare HMO | Source: Ambulatory Visit | Attending: Radiation Oncology | Admitting: Radiation Oncology

## 2020-11-01 DIAGNOSIS — Z170 Estrogen receptor positive status [ER+]: Secondary | ICD-10-CM | POA: Diagnosis not present

## 2020-11-01 DIAGNOSIS — Z510 Encounter for antineoplastic radiation therapy: Secondary | ICD-10-CM | POA: Diagnosis not present

## 2020-11-01 DIAGNOSIS — C50412 Malignant neoplasm of upper-outer quadrant of left female breast: Secondary | ICD-10-CM | POA: Diagnosis not present

## 2020-11-02 ENCOUNTER — Ambulatory Visit
Admission: RE | Admit: 2020-11-02 | Discharge: 2020-11-02 | Disposition: A | Discharge status: Home / Self Care | Payer: Medicare HMO | Source: Ambulatory Visit | Attending: Radiation Oncology | Admitting: Radiation Oncology

## 2020-11-02 DIAGNOSIS — Z170 Estrogen receptor positive status [ER+]: Secondary | ICD-10-CM | POA: Diagnosis not present

## 2020-11-02 DIAGNOSIS — C50412 Malignant neoplasm of upper-outer quadrant of left female breast: Secondary | ICD-10-CM | POA: Diagnosis not present

## 2020-11-02 DIAGNOSIS — Z510 Encounter for antineoplastic radiation therapy: Secondary | ICD-10-CM | POA: Diagnosis not present

## 2020-11-05 ENCOUNTER — Ambulatory Visit
Admission: RE | Admit: 2020-11-05 | Discharge: 2020-11-05 | Disposition: A | Discharge status: Home / Self Care | Payer: Medicare HMO | Source: Ambulatory Visit | Attending: Radiation Oncology | Admitting: Radiation Oncology

## 2020-11-05 DIAGNOSIS — Z510 Encounter for antineoplastic radiation therapy: Secondary | ICD-10-CM | POA: Diagnosis not present

## 2020-11-05 DIAGNOSIS — C50412 Malignant neoplasm of upper-outer quadrant of left female breast: Secondary | ICD-10-CM | POA: Diagnosis not present

## 2020-11-05 DIAGNOSIS — Z170 Estrogen receptor positive status [ER+]: Secondary | ICD-10-CM | POA: Diagnosis not present

## 2020-11-06 ENCOUNTER — Ambulatory Visit
Admission: RE | Admit: 2020-11-06 | Discharge: 2020-11-06 | Disposition: A | Discharge status: Home / Self Care | Payer: Medicare HMO | Source: Ambulatory Visit | Attending: Radiation Oncology | Admitting: Radiation Oncology

## 2020-11-06 DIAGNOSIS — Z510 Encounter for antineoplastic radiation therapy: Secondary | ICD-10-CM | POA: Diagnosis not present

## 2020-11-06 DIAGNOSIS — C50412 Malignant neoplasm of upper-outer quadrant of left female breast: Secondary | ICD-10-CM | POA: Diagnosis not present

## 2020-11-06 DIAGNOSIS — Z170 Estrogen receptor positive status [ER+]: Secondary | ICD-10-CM | POA: Diagnosis not present

## 2020-11-07 ENCOUNTER — Ambulatory Visit
Admission: RE | Admit: 2020-11-07 | Discharge: 2020-11-07 | Disposition: A | Discharge status: Home / Self Care | Payer: Medicare HMO | Source: Ambulatory Visit | Attending: Radiation Oncology | Admitting: Radiation Oncology

## 2020-11-07 DIAGNOSIS — Z510 Encounter for antineoplastic radiation therapy: Secondary | ICD-10-CM | POA: Diagnosis not present

## 2020-11-07 DIAGNOSIS — Z170 Estrogen receptor positive status [ER+]: Secondary | ICD-10-CM | POA: Diagnosis not present

## 2020-11-07 DIAGNOSIS — C50412 Malignant neoplasm of upper-outer quadrant of left female breast: Secondary | ICD-10-CM | POA: Diagnosis not present

## 2020-11-08 ENCOUNTER — Ambulatory Visit
Admission: RE | Admit: 2020-11-08 | Discharge: 2020-11-08 | Disposition: A | Discharge status: Home / Self Care | Payer: Medicare HMO | Source: Ambulatory Visit | Attending: Radiation Oncology | Admitting: Radiation Oncology

## 2020-11-08 DIAGNOSIS — C50412 Malignant neoplasm of upper-outer quadrant of left female breast: Secondary | ICD-10-CM | POA: Diagnosis not present

## 2020-11-08 DIAGNOSIS — Z510 Encounter for antineoplastic radiation therapy: Secondary | ICD-10-CM | POA: Diagnosis not present

## 2020-11-08 DIAGNOSIS — Z170 Estrogen receptor positive status [ER+]: Secondary | ICD-10-CM | POA: Diagnosis not present

## 2020-11-09 ENCOUNTER — Ambulatory Visit
Admission: RE | Admit: 2020-11-09 | Discharge: 2020-11-09 | Disposition: A | Discharge status: Home / Self Care | Payer: Medicare HMO | Source: Ambulatory Visit | Attending: Radiation Oncology | Admitting: Radiation Oncology

## 2020-11-09 DIAGNOSIS — Z510 Encounter for antineoplastic radiation therapy: Secondary | ICD-10-CM | POA: Diagnosis not present

## 2020-11-09 DIAGNOSIS — Z170 Estrogen receptor positive status [ER+]: Secondary | ICD-10-CM | POA: Diagnosis not present

## 2020-11-09 DIAGNOSIS — C50412 Malignant neoplasm of upper-outer quadrant of left female breast: Secondary | ICD-10-CM | POA: Diagnosis not present

## 2020-11-12 ENCOUNTER — Ambulatory Visit
Admission: RE | Admit: 2020-11-12 | Discharge: 2020-11-12 | Disposition: A | Discharge status: Home / Self Care | Payer: Medicare HMO | Source: Ambulatory Visit | Attending: Radiation Oncology | Admitting: Radiation Oncology

## 2020-11-12 DIAGNOSIS — C50412 Malignant neoplasm of upper-outer quadrant of left female breast: Secondary | ICD-10-CM | POA: Insufficient documentation

## 2020-11-12 DIAGNOSIS — Z510 Encounter for antineoplastic radiation therapy: Secondary | ICD-10-CM | POA: Insufficient documentation

## 2020-11-12 DIAGNOSIS — Z170 Estrogen receptor positive status [ER+]: Secondary | ICD-10-CM | POA: Diagnosis not present

## 2020-11-13 ENCOUNTER — Ambulatory Visit
Admission: RE | Admit: 2020-11-13 | Discharge: 2020-11-13 | Disposition: A | Discharge status: Home / Self Care | Payer: Medicare HMO | Source: Ambulatory Visit | Attending: Radiation Oncology | Admitting: Radiation Oncology

## 2020-11-13 ENCOUNTER — Ambulatory Visit: Payer: Medicare HMO

## 2020-11-13 DIAGNOSIS — Z510 Encounter for antineoplastic radiation therapy: Secondary | ICD-10-CM | POA: Insufficient documentation

## 2020-11-13 DIAGNOSIS — Z170 Estrogen receptor positive status [ER+]: Secondary | ICD-10-CM | POA: Diagnosis not present

## 2020-11-13 DIAGNOSIS — C50412 Malignant neoplasm of upper-outer quadrant of left female breast: Secondary | ICD-10-CM | POA: Insufficient documentation

## 2020-11-13 NOTE — Telephone Encounter (Signed)
Lvm asking pt to call back.  Need to know if she is ready to proceed with left knee replacement surgery.  If so, pt needs to schedule OV for pre-op evaluation.  [Pre-op form is in basket on [employer].]

## 2020-11-14 ENCOUNTER — Ambulatory Visit: Payer: Medicare HMO

## 2020-11-14 ENCOUNTER — Ambulatory Visit
Admission: RE | Admit: 2020-11-14 | Discharge: 2020-11-14 | Disposition: A | Discharge status: Home / Self Care | Payer: Medicare HMO | Source: Ambulatory Visit | Attending: Radiation Oncology | Admitting: Radiation Oncology

## 2020-11-14 DIAGNOSIS — Z510 Encounter for antineoplastic radiation therapy: Secondary | ICD-10-CM | POA: Diagnosis not present

## 2020-11-14 DIAGNOSIS — C50412 Malignant neoplasm of upper-outer quadrant of left female breast: Secondary | ICD-10-CM | POA: Diagnosis not present

## 2020-11-14 NOTE — Telephone Encounter (Signed)
Lvm asking pt to call back.  Need to know if she is ready to proceed with left knee replacement surgery.  If so, pt needs to schedule OV for pre-op evaluation.  [Pre-op form is in basket on [employer].]

## 2020-11-15 ENCOUNTER — Ambulatory Visit
Admission: RE | Admit: 2020-11-15 | Discharge: 2020-11-15 | Disposition: A | Discharge status: Home / Self Care | Payer: Medicare HMO | Source: Ambulatory Visit | Attending: Radiation Oncology | Admitting: Radiation Oncology

## 2020-11-15 ENCOUNTER — Inpatient Hospital Stay: Payer: Medicare HMO | Attending: Radiation Oncology

## 2020-11-15 DIAGNOSIS — Z510 Encounter for antineoplastic radiation therapy: Secondary | ICD-10-CM | POA: Diagnosis not present

## 2020-11-15 DIAGNOSIS — C50412 Malignant neoplasm of upper-outer quadrant of left female breast: Secondary | ICD-10-CM | POA: Diagnosis not present

## 2020-11-15 DIAGNOSIS — Z170 Estrogen receptor positive status [ER+]: Secondary | ICD-10-CM

## 2020-11-15 LAB — CBC
HCT: 38 % (ref 36.0–46.0)
Hemoglobin: 12 g/dL (ref 12.0–15.0)
MCH: 30.2 pg (ref 26.0–34.0)
MCHC: 31.6 g/dL (ref 30.0–36.0)
MCV: 95.5 fL (ref 80.0–100.0)
Platelets: 307 10*3/uL (ref 150–400)
RBC: 3.98 MIL/uL (ref 3.87–5.11)
RDW: 12.2 % (ref 11.5–15.5)
WBC: 6.3 10*3/uL (ref 4.0–10.5)
nRBC: 0 % (ref 0.0–0.2)

## 2020-11-15 NOTE — Telephone Encounter (Signed)
Spoke with pt asking about surgery.  States for now, she is getting injections and chooses not to have surgery.  If she changes her mind, she will let Raliegh Ip know.    Form discarded.

## 2020-11-16 ENCOUNTER — Ambulatory Visit
Admission: RE | Admit: 2020-11-16 | Discharge: 2020-11-16 | Disposition: A | Discharge status: Home / Self Care | Payer: Medicare HMO | Source: Ambulatory Visit | Attending: Radiation Oncology | Admitting: Radiation Oncology

## 2020-11-16 DIAGNOSIS — C50412 Malignant neoplasm of upper-outer quadrant of left female breast: Secondary | ICD-10-CM | POA: Diagnosis not present

## 2020-11-16 DIAGNOSIS — Z510 Encounter for antineoplastic radiation therapy: Secondary | ICD-10-CM | POA: Diagnosis not present

## 2020-11-16 DIAGNOSIS — Z170 Estrogen receptor positive status [ER+]: Secondary | ICD-10-CM | POA: Diagnosis not present

## 2020-11-19 ENCOUNTER — Ambulatory Visit
Admission: RE | Admit: 2020-11-19 | Discharge: 2020-11-19 | Disposition: A | Discharge status: Home / Self Care | Payer: Medicare HMO | Source: Ambulatory Visit | Attending: Radiation Oncology | Admitting: Radiation Oncology

## 2020-11-19 DIAGNOSIS — Z510 Encounter for antineoplastic radiation therapy: Secondary | ICD-10-CM | POA: Diagnosis not present

## 2020-11-19 DIAGNOSIS — Z170 Estrogen receptor positive status [ER+]: Secondary | ICD-10-CM | POA: Diagnosis not present

## 2020-11-19 DIAGNOSIS — C50412 Malignant neoplasm of upper-outer quadrant of left female breast: Secondary | ICD-10-CM | POA: Diagnosis not present

## 2020-11-20 ENCOUNTER — Ambulatory Visit
Admission: RE | Admit: 2020-11-20 | Discharge: 2020-11-20 | Disposition: A | Discharge status: Home / Self Care | Payer: Medicare HMO | Source: Ambulatory Visit | Attending: Radiation Oncology | Admitting: Radiation Oncology

## 2020-11-20 DIAGNOSIS — Z510 Encounter for antineoplastic radiation therapy: Secondary | ICD-10-CM | POA: Diagnosis not present

## 2020-11-20 DIAGNOSIS — C50412 Malignant neoplasm of upper-outer quadrant of left female breast: Secondary | ICD-10-CM | POA: Diagnosis not present

## 2020-11-20 DIAGNOSIS — Z170 Estrogen receptor positive status [ER+]: Secondary | ICD-10-CM | POA: Diagnosis not present

## 2020-11-21 ENCOUNTER — Ambulatory Visit
Admission: RE | Admit: 2020-11-21 | Discharge: 2020-11-21 | Disposition: A | Discharge status: Home / Self Care | Payer: Medicare HMO | Source: Ambulatory Visit | Attending: Radiation Oncology | Admitting: Radiation Oncology

## 2020-11-21 DIAGNOSIS — Z170 Estrogen receptor positive status [ER+]: Secondary | ICD-10-CM | POA: Diagnosis not present

## 2020-11-21 DIAGNOSIS — C50412 Malignant neoplasm of upper-outer quadrant of left female breast: Secondary | ICD-10-CM | POA: Diagnosis not present

## 2020-11-21 DIAGNOSIS — Z510 Encounter for antineoplastic radiation therapy: Secondary | ICD-10-CM | POA: Diagnosis not present

## 2020-11-22 ENCOUNTER — Ambulatory Visit
Admission: RE | Admit: 2020-11-22 | Discharge: 2020-11-22 | Disposition: A | Discharge status: Home / Self Care | Payer: Medicare HMO | Source: Ambulatory Visit | Attending: Radiation Oncology | Admitting: Radiation Oncology

## 2020-11-22 DIAGNOSIS — C50412 Malignant neoplasm of upper-outer quadrant of left female breast: Secondary | ICD-10-CM | POA: Diagnosis not present

## 2020-11-22 DIAGNOSIS — Z510 Encounter for antineoplastic radiation therapy: Secondary | ICD-10-CM | POA: Diagnosis not present

## 2020-11-23 ENCOUNTER — Ambulatory Visit
Admission: RE | Admit: 2020-11-23 | Discharge: 2020-11-23 | Disposition: A | Discharge status: Home / Self Care | Payer: Medicare HMO | Source: Ambulatory Visit | Attending: Radiation Oncology | Admitting: Radiation Oncology

## 2020-11-23 DIAGNOSIS — C50412 Malignant neoplasm of upper-outer quadrant of left female breast: Secondary | ICD-10-CM | POA: Diagnosis not present

## 2020-11-23 DIAGNOSIS — Z510 Encounter for antineoplastic radiation therapy: Secondary | ICD-10-CM | POA: Diagnosis not present

## 2020-11-26 ENCOUNTER — Ambulatory Visit
Admission: RE | Admit: 2020-11-26 | Discharge: 2020-11-26 | Disposition: A | Discharge status: Home / Self Care | Payer: Medicare HMO | Source: Ambulatory Visit | Attending: Radiation Oncology | Admitting: Radiation Oncology

## 2020-11-26 DIAGNOSIS — C50412 Malignant neoplasm of upper-outer quadrant of left female breast: Secondary | ICD-10-CM | POA: Diagnosis not present

## 2020-11-26 DIAGNOSIS — Z510 Encounter for antineoplastic radiation therapy: Secondary | ICD-10-CM | POA: Diagnosis not present

## 2020-11-27 ENCOUNTER — Ambulatory Visit: Payer: Medicare HMO | Admitting: Family Medicine

## 2020-11-27 ENCOUNTER — Encounter: Payer: Self-pay | Admitting: *Deleted

## 2020-11-27 ENCOUNTER — Telehealth: Payer: Self-pay

## 2020-11-27 ENCOUNTER — Ambulatory Visit
Admission: RE | Admit: 2020-11-27 | Discharge: 2020-11-27 | Disposition: A | Discharge status: Home / Self Care | Payer: Medicare HMO | Source: Ambulatory Visit | Attending: Radiation Oncology | Admitting: Radiation Oncology

## 2020-11-27 ENCOUNTER — Telehealth (INDEPENDENT_AMBULATORY_CARE_PROVIDER_SITE_OTHER): Payer: Medicare HMO | Admitting: Family Medicine

## 2020-11-27 ENCOUNTER — Encounter: Payer: Self-pay | Admitting: Family Medicine

## 2020-11-27 ENCOUNTER — Other Ambulatory Visit: Payer: Self-pay

## 2020-11-27 VITALS — BP 133/75 | HR 72 | Temp 96.7°F | Resp 16 | Ht 62.0 in | Wt 196.0 lb

## 2020-11-27 DIAGNOSIS — E119 Type 2 diabetes mellitus without complications: Secondary | ICD-10-CM

## 2020-11-27 DIAGNOSIS — Z170 Estrogen receptor positive status [ER+]: Secondary | ICD-10-CM | POA: Diagnosis not present

## 2020-11-27 DIAGNOSIS — C50412 Malignant neoplasm of upper-outer quadrant of left female breast: Secondary | ICD-10-CM

## 2020-11-27 DIAGNOSIS — Z510 Encounter for antineoplastic radiation therapy: Secondary | ICD-10-CM | POA: Diagnosis not present

## 2020-11-27 MED ORDER — METOPROLOL TARTRATE 25 MG PO TABS: 25.0000 mg | ORAL_TABLET | Freq: Two times a day (BID) | ORAL | 1 refills | Status: DC | PRN

## 2020-11-27 NOTE — Telephone Encounter (Signed)
Lvm asking pt to call back.  Need to get pt ready for 11:30 MyChart visit.

## 2020-11-27 NOTE — Assessment & Plan Note (Addendum)
Chronic, stable period on metformin.  Will get labs drawn tomorrow and determine need for med changes at that time.  Has needle phobia.

## 2020-11-27 NOTE — Telephone Encounter (Signed)
Pt returning call.  Seen by Dr. Darnell Level today.

## 2020-11-27 NOTE — Assessment & Plan Note (Signed)
Appreciate surgery, onc and rad onc care.  Tomorrow is last radiation treatment, has onc f/u scheduled at end of month.

## 2020-11-27 NOTE — Progress Notes (Signed)
Patient ID: Brandy Guerrero, female    DOB: 1950-04-24, 70 y.o.   MRN: [id number]  Virtual visit completed through [place], a video enabled telemedicine application. Due to national recommendations of social distancing due to COVID-19, a virtual visit is felt to be most appropriate for this patient at this time. Reviewed limitations, risks, security and privacy concerns of performing a virtual visit and the availability of in person appointments. I also reviewed that there may be a patient responsible charge related to this service. The patient agreed to proceed.   Patient location: in her car in parking lot  Provider location: Financial controller at [employer], office  Persons participating in this virtual visit: patient, provider   If any vitals were documented, they were collected by patient at home unless specified below.    BP 133/75   Pulse 72   Temp (!) 96.7 F (35.9 C)   Resp 16   Ht 5\' 2"  (1.575 m)   Wt 196 lb (88.9 kg)   BMI 35.85 kg/m    CC: DM f/u visit  Subjective:   HPI: Brandy Guerrero is a 70 y.o. female presenting on 11/27/2020 for Diabetes (6 mo f/u.  Wants to discuss metformin. )   Recent diagnosis R breast cancer (ER/PR positive) s/p wide local excision Brandy Guerrero) and SNL biopsy now seeing rad onc (Brandy Guerrero) for adjuvant L whole breast radiation then planning to return to onc Brandy Guerrero) to start anti-estrogens. Tomorrow is last radiation treatment.   DM - does not regularly check sugars. Compliant with antihyperglycemic regimen which includes: metformin 500mg  daily. Denies hypoglycemic symptoms. Denies paresthesias, blurry vision. Last diabetic eye exam 05/2020. Glucometer brand: doesn't have one - declines. Last foot exam: 07/2019 - DUE. DSME: has not completed.  Lab Results  Component Value Date   HGBA1C 7.2 (H) 09/21/2020   Diabetic Foot Exam - Simple   No data filed    Lab Results  Component Value Date   MICROALBUR 0.8 04/24/2020       Relevant past medical,  surgical, family and social history reviewed and updated as indicated. Interim medical history since our last visit reviewed. Allergies and medications reviewed and updated. Outpatient Medications Prior to Visit  Medication Sig Dispense Refill   atorvastatin (LIPITOR) 20 MG tablet Take 1 tablet by mouth once daily 90 tablet 3   latanoprost (XALATAN) 0.005 % ophthalmic solution Place 1 drop into the left eye at bedtime.     metFORMIN (GLUCOPHAGE) 500 MG tablet Take 1 tablet (500 mg total) by mouth daily with breakfast. 90 tablet 3   OVER THE COUNTER MEDICATION Take 2 capsules by mouth 2 (two) times daily. [place]     metoprolol tartrate (LOPRESSOR) 25 MG tablet Take 1 tablet (25 mg total) by mouth 2 (two) times daily as needed. Heart fluttering (Patient taking differently: Take 25 mg by mouth 2 (two) times daily as needed (heart fluttering).) 60 tablet 3   HYDROcodone-acetaminophen (NORCO/VICODIN) 5-325 MG tablet Take 1-2 tablets by mouth every 6 (six) hours as needed for moderate pain or severe pain. 15 tablet 0   HYDROcodone-acetaminophen (NORCO/VICODIN) 5-325 MG tablet Take 1-2 tablets by mouth every 6 (six) hours as needed. 15 tablet 0   No facility-administered medications prior to visit.     Per HPI unless specifically indicated in ROS section below Review of Systems Objective:  BP 133/75   Pulse 72   Temp (!) 96.7 F (35.9 C)   Resp 16   Ht 5\' 2"  (  1.575 m)   Wt 196 lb (88.9 kg)   BMI 35.85 kg/m   Wt Readings from Last 3 Encounters:  11/27/20 196 lb (88.9 kg)  10/12/20 192 lb (87.1 kg)  09/26/20 190 lb (86.2 kg)       Physical exam: Gen: alert, NAD, not ill appearing Pulm: speaks in complete sentences without increased work of breathing Psych: normal mood, normal thought content      Results for orders placed or performed in visit on 11/15/20  CBC  Result Value Ref Range   WBC 6.3 4.0 - 10.5 K/uL   RBC 3.98 3.87 - 5.11 MIL/uL   Hemoglobin 12.0 12.0 - 15.0 g/dL   HCT  38.0 36.0 - 46.0 %   MCV 95.5 80.0 - 100.0 fL   MCH 30.2 26.0 - 34.0 pg   MCHC 31.6 30.0 - 36.0 g/dL   RDW 12.2 11.5 - 15.5 %   Platelets 307 150 - 400 K/uL   nRBC 0.0 0.0 - 0.2 %   Assessment & Plan:   Problem List Items Addressed This Visit     Controlled type 2 diabetes mellitus without complication, without long-term current use of insulin (H[facility]) - Primary    Chronic, stable period on metformin.  Will get labs drawn tomorrow and determine need for med changes at that time.  Has needle phobia.       Relevant Orders   Basic metabolic panel   Fructosamine   Malignant neoplasm of upper-outer quadrant of left breast in female, estrogen receptor positive (Brandy Guerrero)    Appreciate surgery, onc and rad onc care.  Tomorrow is last radiation treatment, has onc f/u scheduled at end of month.         Meds ordered this encounter  Medications   metoprolol tartrate (LOPRESSOR) 25 MG tablet    Sig: Take 1 tablet (25 mg total) by mouth 2 (two) times daily as needed (heart fluttering).    Dispense:  30 tablet    Refill:  1   Orders Placed This Encounter  Procedures   Basic metabolic panel    Standing Status:   Future    Standing Expiration Date:   11/27/2021   Fructosamine    Standing Status:   Future    Standing Expiration Date:   11/27/2021    I discussed the assessment and treatment plan with the patient. The patient was provided an opportunity to ask questions and all were answered. The patient agreed with the plan and demonstrated an understanding of the instructions. The patient was advised to call back or seek an in-person evaluation if the symptoms worsen or if the condition fails to improve as anticipated.  Follow up plan: Return in about 6 months (around 05/28/2021) for annual exam, prior fasting for blood work, medicare wellness visit.  Ria Bush, MD

## 2020-11-28 ENCOUNTER — Ambulatory Visit
Admission: RE | Admit: 2020-11-28 | Discharge: 2020-11-28 | Disposition: A | Discharge status: Home / Self Care | Payer: Medicare HMO | Source: Ambulatory Visit | Attending: Radiation Oncology | Admitting: Radiation Oncology

## 2020-11-28 DIAGNOSIS — C50412 Malignant neoplasm of upper-outer quadrant of left female breast: Secondary | ICD-10-CM | POA: Diagnosis not present

## 2020-11-28 DIAGNOSIS — Z510 Encounter for antineoplastic radiation therapy: Secondary | ICD-10-CM | POA: Diagnosis not present

## 2020-12-03 ENCOUNTER — Other Ambulatory Visit: Payer: Medicare HMO

## 2020-12-05 ENCOUNTER — Telehealth: Payer: Self-pay | Admitting: *Deleted

## 2020-12-05 ENCOUNTER — Other Ambulatory Visit (INDEPENDENT_AMBULATORY_CARE_PROVIDER_SITE_OTHER): Payer: Medicare HMO

## 2020-12-05 ENCOUNTER — Other Ambulatory Visit: Payer: Self-pay

## 2020-12-05 DIAGNOSIS — E119 Type 2 diabetes mellitus without complications: Secondary | ICD-10-CM

## 2020-12-05 LAB — BASIC METABOLIC PANEL
BUN: 12 mg/dL (ref 6–23)
CO2: 30 mEq/L (ref 19–32)
Calcium: 9.3 mg/dL (ref 8.4–10.5)
Chloride: 103 mEq/L (ref 96–112)
Creatinine, Ser: 0.69 mg/dL (ref 0.40–1.20)
GFR: 88.78 mL/min (ref >60.00)
Glucose, Bld: 166 mg/dL — ABNORMAL HIGH (ref 70–99)
Potassium: 4.1 mEq/L (ref 3.5–5.1)
Sodium: 139 mEq/L (ref 135–145)

## 2020-12-05 NOTE — Telephone Encounter (Signed)
RN attempt x1 to contact pt regarding rescheduling 12/10/20 appt due to MD being out of the office.  No answer, LVM with updated appt information.

## 2020-12-09 LAB — FRUCTOSAMINE: Fructosamine: 297 umol/L — ABNORMAL HIGH (ref 205–285)

## 2020-12-10 ENCOUNTER — Other Ambulatory Visit: Payer: Self-pay | Admitting: Family Medicine

## 2020-12-10 ENCOUNTER — Ambulatory Visit: Payer: Medicare HMO | Admitting: Hematology and Oncology

## 2020-12-10 MED ORDER — METFORMIN HCL 500 MG PO TABS: 500.0000 mg | ORAL_TABLET | Freq: Two times a day (BID) | ORAL | 3 refills | Status: DC

## 2020-12-16 DIAGNOSIS — J0100 Acute maxillary sinusitis, unspecified: Secondary | ICD-10-CM | POA: Diagnosis not present

## 2020-12-16 DIAGNOSIS — Z03818 Encounter for observation for suspected exposure to other biological agents ruled out: Secondary | ICD-10-CM | POA: Diagnosis not present

## 2020-12-16 DIAGNOSIS — R6889 Other general symptoms and signs: Secondary | ICD-10-CM | POA: Diagnosis not present

## 2020-12-16 DIAGNOSIS — Z20822 Contact with and (suspected) exposure to covid-19: Secondary | ICD-10-CM | POA: Diagnosis not present

## 2020-12-20 ENCOUNTER — Ambulatory Visit: Payer: Medicare HMO | Admitting: Hematology and Oncology

## 2020-12-20 ENCOUNTER — Inpatient Hospital Stay: Payer: Medicare HMO | Admitting: Hematology and Oncology

## 2020-12-22 NOTE — Progress Notes (Signed)
 Patient Care Team: Gutierrez, Javier, MD as PCP - General (Family Medicine) Martini, Keisha N, RN as Oncology Nurse Navigator Stuart, Dawn C, RN as Oncology Nurse Navigator  DIAGNOSIS:    ICD-10-CM   1. Post-menopausal  Z78.0 DG Bone Density    2. Malignant neoplasm of upper-outer quadrant of left breast in female, estrogen receptor positive (HCC)  C50.412    Z17.0       SUMMARY OF ONCOLOGIC HISTORY: Oncology History  Atypical ductal hyperplasia of left breast  08/02/2020 Initial Diagnosis   Invasive ductal carcinoma of the left breast  Biopsy of left breast calcifications on 05/23/2016 showed atypical ductal hyperplasia. Diagnostic mammogram and US on 07/13/20 showed a 7 mm mass in the 10 o'clock position of the left breast. 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   Malignant neoplasm of upper-outer quadrant of left breast in female, estrogen receptor positive (HCC)  08/02/2020 Initial Diagnosis   Screening mammogram detected left breast mass measuring 7 mm at 10 o'clock position.  Biopsy revealed grade 2 IDC with DCIS with calcifications, ER 90%, PR 100%, Ki67 10%, HER2 negative   09/26/2020 Surgery   Left lumpectomy: Grade 1 IDC 0.8 cm, with DCIS low-grade, 0/5 lymph nodes, ER 90%, PR 100%, HER2 negative, Ki-67 10%   10/30/2020 - 11/28/2020 Radiation Therapy   Adj XRT at [facility]   12/24/2020 -  Anti-estrogen oral therapy   Letrozole 2.5 mg daily     CHIEF COMPLIANT: Follow-up of left breast cancer  INTERVAL HISTORY: Brandy Guerrero is a 70 y.o. with above-mentioned history of invasive ductal carcinoma of the left breast having undergone lumpectomy.  She completed radiation at [facility] and is here for discussion regarding antiestrogen therapy.  Apart from the slight itching and occasional breast discomfort she is doing quite well.  ALLERGIES:  is allergic to sulfa  antibiotics.  MEDICATIONS:  Current Outpatient Medications  Medication Sig Dispense Refill   letrozole (FEMARA) 2.5 MG tablet Take 1 tablet (2.5 mg total) by mouth daily. 90 tablet 3   atorvastatin (LIPITOR) 20 MG tablet Take 1 tablet by mouth once daily 90 tablet 3   latanoprost (XALATAN) 0.005 % ophthalmic solution Place 1 drop into the left eye at bedtime.     metFORMIN (GLUCOPHAGE) 500 MG tablet Take 1 tablet (500 mg total) by mouth 2 (two) times daily with a meal. 180 tablet 3   metoprolol tartrate (LOPRESSOR) 25 MG tablet Take 1 tablet (25 mg total) by mouth 2 (two) times daily as needed (heart fluttering). 30 tablet 1   OVER THE COUNTER MEDICATION Take 2 capsules by mouth 2 (two) times daily. [place]     No current facility-administered medications for this visit.    PHYSICAL EXAMINATION: ECOG PERFORMANCE STATUS: 1 - Symptomatic but completely ambulatory  Vitals:   12/24/20 0859  BP: (!) 150/69  Pulse: 76  Resp: 18  Temp: (!) 97.3 F (36.3 C)  SpO2: 100%   Filed Weights   12/24/20 0859  Weight: 195 lb 4.8 oz (88.6 kg)       LABORATORY DATA:  I have reviewed the data as listed CMP Latest Ref Rng & Units 12/05/2020 09/21/2020 04/24/2020  Glucose 70 - 99 mg/dL 166(H) 149(H) 177(H)  BUN 6 - 23 mg/dL 12 17 15  Creatinine 0.40 - 1.20 mg/dL 0.69 0.79 0.75  Sodium 135 - 145 mEq/L 139 140 140  Potassium 3.5 - 5.1 mEq/L 4.1 4.2 4.0    Chloride 96 - 112 mEq/L 103 104 104  CO2 19 - 32 mEq/L 30 28 31  Calcium 8.4 - 10.5 mg/dL 9.3 9.4 9.1  Total Protein 6.0 - 8.3 g/dL - - 6.8  Total Bilirubin 0.2 - 1.2 mg/dL - - 0.6  Alkaline Phos 39 - 117 U/L - - 121(H)  AST 0 - 37 U/L - - 14  ALT 0 - 35 U/L - - 13    Lab Results  Component Value Date   WBC 6.3 11/15/2020   HGB 12.0 11/15/2020   HCT 38.0 11/15/2020   MCV 95.5 11/15/2020   PLT 307 11/15/2020   NEUTROABS 4.3 03/08/2020    ASSESSMENT & PLAN:  Malignant neoplasm of upper-outer quadrant of left breast in female, estrogen  receptor positive (HCC) Diagnostic mammogram and US on 07/13/20 showed a 7 mm mass in the 10 o'clock position of the left breast. 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   09/26/2020:Left lumpectomy: Grade 1 IDC 0.8 cm, with DCIS low-grade, 0/5 lymph nodes, ER 90%, PR 100%, HER2 negative, Ki-67 10%   Treatment plan: 1.  adjuvant radiation at [facility] completed 11/28/20 2.  Followed by adjuvant antiestrogen therapy started 12/24/20 Letrozole counseling: We discussed the risks and benefits of anti-estrogen therapy with aromatase inhibitors. These include but not limited to insomnia, hot flashes, mood changes, vaginal dryness, bone density loss, and weight gain. We strongly believe that the benefits far outweigh the risks. Patient understands these risks and consented to starting treatment. Planned treatment duration is 5-7 years.  She tells me that she has marital issues with her husband. She is extremely independent and stays fairly active. Bone density will be ordered after the holidays.  Return to clinic in 3 months for SCP visit    Orders Placed This Encounter  Procedures   DG Bone Density    Standing Status:   Future    Standing Expiration Date:   12/24/2021    Order Specific Question:   Reason for Exam (SYMPTOM  OR DIAGNOSIS REQUIRED)    Answer:   post menopausal    Order Specific Question:   Preferred imaging location?    Answer:   [facility]    Order Specific Question:   Release to patient    Answer:   Immediate    The patient has a good understanding of the overall plan. she agrees with it. she will call with any problems that may develop before the next visit here.  Total time spent: 20 mins including face to face time and time spent for planning, charting and coordination of care  Vinay K Gudena, MD, MPH 12/24/2020  I, Kirstyn Evans, am acting as scribe for Dr. Vinay Gudena.  I have  reviewed the above documentation for accuracy and completeness, and I agree with the above.       

## 2020-12-23 NOTE — Assessment & Plan Note (Signed)
Diagnostic mammogram and [place] on 07/13/20 showed a 7 mm mass in the 10 o'clock position of the left breast. 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  09/26/2020:Left lumpectomy: Grade 1 IDC 0.8 cm, with DCIS low-grade, 0/5 lymph nodes, ER 90%, PR 100%, HER2 negative, Ki-67 10%  Treatment plan: 1.  adjuvant radiation at [facility] 2.  Followed by adjuvant antiestrogen therapy  Return to clinic after radiation is complete to start antiestrogens.

## 2020-12-24 ENCOUNTER — Inpatient Hospital Stay: Payer: Medicare HMO | Attending: Hematology and Oncology | Admitting: Hematology and Oncology

## 2020-12-24 ENCOUNTER — Other Ambulatory Visit: Payer: Self-pay

## 2020-12-24 VITALS — BP 150/69 | HR 76 | Temp 97.3°F | Resp 18 | Ht 62.0 in | Wt 195.3 lb

## 2020-12-24 DIAGNOSIS — Z79811 Long term (current) use of aromatase inhibitors: Secondary | ICD-10-CM | POA: Diagnosis not present

## 2020-12-24 DIAGNOSIS — Z780 Asymptomatic menopausal state: Secondary | ICD-10-CM | POA: Diagnosis not present

## 2020-12-24 DIAGNOSIS — C50412 Malignant neoplasm of upper-outer quadrant of left female breast: Secondary | ICD-10-CM

## 2020-12-24 DIAGNOSIS — Z170 Estrogen receptor positive status [ER+]: Secondary | ICD-10-CM | POA: Diagnosis not present

## 2020-12-24 MED ORDER — LETROZOLE 2.5 MG PO TABS: 2.5000 mg | ORAL_TABLET | Freq: Every day | ORAL | 3 refills | Status: DC

## 2020-12-26 ENCOUNTER — Other Ambulatory Visit: Payer: Self-pay

## 2020-12-26 ENCOUNTER — Encounter: Payer: Self-pay | Admitting: Radiation Oncology

## 2020-12-26 ENCOUNTER — Ambulatory Visit
Admission: RE | Admit: 2020-12-26 | Discharge: 2020-12-26 | Disposition: A | Discharge status: Home / Self Care | Payer: Medicare HMO | Source: Ambulatory Visit | Attending: Radiation Oncology | Admitting: Radiation Oncology

## 2020-12-26 VITALS — BP 126/67 | HR 74 | Temp 97.6°F | Wt 195.9 lb

## 2020-12-26 DIAGNOSIS — Z79811 Long term (current) use of aromatase inhibitors: Secondary | ICD-10-CM | POA: Insufficient documentation

## 2020-12-26 DIAGNOSIS — C50412 Malignant neoplasm of upper-outer quadrant of left female breast: Secondary | ICD-10-CM | POA: Diagnosis not present

## 2020-12-26 DIAGNOSIS — Z923 Personal history of irradiation: Secondary | ICD-10-CM | POA: Insufficient documentation

## 2020-12-26 DIAGNOSIS — Z170 Estrogen receptor positive status [ER+]: Secondary | ICD-10-CM | POA: Diagnosis not present

## 2020-12-26 NOTE — Progress Notes (Signed)
Radiation Oncology Follow up Note  Name: Brandy Guerrero   Date:   12/26/2020 MRN:  [id number] DOB: 1951/01/15    This 70 y.o. female presents to the clinic today for 1 month follow-up.  Status post whole breast radiation to her left breast (T1 N0 M0) invasive mammary carcinoma ER  REFERRING PROVIDER: Ria Bush, MD  HPI: Patient is a 70 year old female now out 1 month having completed whole breast radiation to her left breast stage Ia ER/PR positive invasive mammary carcinoma.  Seen today in routine follow-up she is doing well.  She specifically denies breast tenderness cough or bone pain.  She is just put on.  Femara so far tolerating it well.  COMPLICATIONS OF TREATMENT: none  FOLLOW UP COMPLIANCE: keeps appointments   PHYSICAL EXAM:  BP 126/67   Pulse 74   Temp 97.6 F (36.4 C) (Tympanic)   Wt 195 lb 14.4 oz (88.9 kg)   BMI 35.83 kg/m  Lungs are clear to A&P cardiac examination essentially unremarkable with regular rate and rhythm. No dominant mass or nodularity is noted in either breast in 2 positions examined. Incision is well-healed. No axillary or supraclavicular adenopathy is appreciated. Cosmetic result is excellent.  Well-developed well-nourished patient in NAD. HEENT reveals PERLA, EOMI, discs not visualized.  Oral cavity is clear. No oral mucosal lesions are identified. Neck is clear without evidence of cervical or supraclavicular adenopathy. Lungs are clear to A&P. Cardiac examination is essentially unremarkable with regular rate and rhythm without murmur rub or thrill. Abdomen is benign with no organomegaly or masses noted. Motor sensory and DTR levels are equal and symmetric in the upper and lower extremities. Cranial nerves II through XII are grossly intact. Proprioception is intact. No peripheral adenopathy or edema is identified. No motor or sensory levels are noted. Crude visual fields are within normal range.  RADIOLOGY RESULTS: No current films for  review  PLAN: Present time patient is doing well 1 month out from whole breast radiation and pleased with her overall progress.  Of asked to see her back in 4 to 5 months for follow-up.  Patient knows to call with any concerns.  She continues on Femara.  I would like to take this opportunity to thank you for allowing me to participate in the care of your patient.Noreene Filbert, MD

## 2021-02-14 ENCOUNTER — Other Ambulatory Visit: Payer: Self-pay

## 2021-02-14 ENCOUNTER — Encounter: Payer: Self-pay | Admitting: Family Medicine

## 2021-02-14 ENCOUNTER — Telehealth (INDEPENDENT_AMBULATORY_CARE_PROVIDER_SITE_OTHER): Payer: Medicare HMO | Admitting: Family Medicine

## 2021-02-14 VITALS — BP 129/80 | HR 79 | Temp 96.8°F | Ht 62.0 in

## 2021-02-14 DIAGNOSIS — R053 Chronic cough: Secondary | ICD-10-CM

## 2021-02-14 MED ORDER — PREDNISONE 20 MG PO TABS: ORAL_TABLET | ORAL | 0 refills | Status: DC

## 2021-02-14 MED ORDER — GUAIFENESIN-CODEINE 100-10 MG/5ML PO SYRP
5.0000 mL | ORAL_SOLUTION | Freq: Every evening | ORAL | 0 refills | Status: DC | PRN
Start: 2021-02-14 — End: 2021-06-24

## 2021-02-14 NOTE — Assessment & Plan Note (Signed)
No S/S of continued infection. Most likely;y post infectious bronchospasm.. will treat with prednisone taper and cough suppressant at night.  Pt will call if new fever, or shortness orf breath.. go to ER if severe SOB

## 2021-02-14 NOTE — Progress Notes (Signed)
VIRTUAL VISIT Due to national recommendations of social distancing due to [place] 19, a virtual visit is felt to be most appropriate for this patient at this time.   I connected with the patient on 02/14/21 at 11:20 AM EST by virtual telehealth platform and verified that I am speaking with the correct person using two identifiers.   I discussed the limitations, risks, security and privacy concerns of performing an evaluation and management service by  virtual telehealth platform and the availability of in person appointments. I also discussed with the patient that there may be a patient responsible charge related to this service. The patient expressed understanding and agreed to proceed.  Patient location: Home Provider Location: [facility] Participants: Eliezer Lofts and Elsie Amis   Chief Complaint  Patient presents with   Cough    Urgent Care 12/2020 URI-Still has lingering dry cough.  No recent Covid Test    History of Present Illness:  71 year old female patient of Dr. Darnell Level with history  of DM as well as recent breast lumpectomy for breast cancer 09/2020 who presents with prolonged cough.   Date of onset: 12/2020   She reports she stated feeling bad in November with congestion and cough, hoarse voice   Reviewed Urgent Care Note: 12/16/2020  Neg flu and neg COVID test at that time.  Treated for bacterial sinusitis with cough medication,azithromycin and prednisone.  She noted improved laryngitis but had continued cough  No longer productive.. just dry hacky cough. Feels well otherwise, worse at night.  No fever.  No  congestion   Has been using mucinex DM, tylenol sinus.  COVID 19 screen COVID testing: none recent COVID vaccine:  12/29/2019 , 04/13/2019 , 03/16/2019   Moderna Covid-19 vaccine Bivalent Booster 12/10/2020  COVID exposure: No recent travel or known exposure to [place] or flu The importance of social distancing was discussed today.    Review of Systems   Constitutional:  Negative for chills and fever.  HENT:  Negative for congestion and ear pain.   Eyes:  Negative for pain and redness.  Respiratory:  Positive for cough. Negative for shortness of breath and wheezing.   Cardiovascular:  Negative for chest pain, palpitations and leg swelling.  Gastrointestinal:  Negative for abdominal pain, blood in stool, constipation, diarrhea, nausea and vomiting.  Genitourinary:  Negative for dysuria.  Musculoskeletal:  Negative for falls and myalgias.  Skin:  Negative for rash.  Neurological:  Negative for dizziness.  Psychiatric/Behavioral:  Negative for depression. The patient is not nervous/anxious.      Past Medical History:  Diagnosis Date   Acne    dermatology - on doxy 100mg  daily   Anemia    Bursitis of right shoulder    History of atrial fibrillation 05/11/2005   h/o parox Afib, nl cardiolite/echo, no prolonged episodes.  metoprolol prn [facility])   HLD (hyperlipidemia)    Obesity    Panic attacks    Pelvic mass    declines resection   Positive occult stool blood test 02/11/2012   s/p normal colonoscopy, rpt stool cards negative   Prediabetes     reports that she has never smoked. She has never used smokeless tobacco. She reports current alcohol use. She reports that she does not use drugs.   Current Outpatient Medications:    atorvastatin (LIPITOR) 20 MG tablet, Take 1 tablet by mouth once daily, Disp: 90 tablet, Rfl: 3   latanoprost (XALATAN) 0.005 % ophthalmic solution, Place 1 drop into  the left eye at bedtime., Disp: , Rfl:    letrozole (FEMARA) 2.5 MG tablet, Take 1 tablet (2.5 mg total) by mouth daily., Disp: 90 tablet, Rfl: 3   metFORMIN (GLUCOPHAGE) 500 MG tablet, Take 1 tablet (500 mg total) by mouth 2 (two) times daily with a meal., Disp: 180 tablet, Rfl: 3   metoprolol tartrate (LOPRESSOR) 25 MG tablet, Take 1 tablet (25 mg total) by mouth 2 (two) times daily as needed (heart fluttering)., Disp: 30 tablet, Rfl: 1    Observations/Objective: Blood pressure 129/80, pulse 79, temperature (!) 96.8 F (36 C), temperature source Tympanic, height 5\' 2"  (1.575 m).  Physical Exam Constitutional:      General: The patient is not in acute distress. Pulmonary:     Effort: Pulmonary effort is normal. No respiratory distress.  Neurological:     Mental Status: The patient is alert and oriented to person, place, and time.  Psychiatric:        Mood and Affect: Mood normal.        Behavior: Behavior normal.    Assessment and Plan Problem List Items Addressed This Visit     Persistent cough for 3 weeks or longer - Primary    No S/S of continued infection. Most likely;y post infectious bronchospasm.. will treat with prednisone taper and cough suppressant at night.  Pt will call if new fever, or shortness orf breath.. go to ER if severe SOB         I discussed the assessment and treatment plan with the patient. The patient was provided an opportunity to ask questions and all were answered. The patient agreed with the plan and demonstrated an understanding of the instructions.   The patient was advised to call back or seek an in-person evaluation if the symptoms worsen or if the condition fails to improve as anticipated.     Eliezer Lofts, MD

## 2021-02-22 DIAGNOSIS — E119 Type 2 diabetes mellitus without complications: Secondary | ICD-10-CM | POA: Diagnosis not present

## 2021-03-23 ENCOUNTER — Encounter: Payer: Self-pay | Admitting: Adult Health

## 2021-03-26 ENCOUNTER — Inpatient Hospital Stay: Payer: Medicare HMO | Admitting: Adult Health

## 2021-05-01 ENCOUNTER — Telehealth: Payer: Self-pay | Admitting: *Deleted

## 2021-05-01 ENCOUNTER — Inpatient Hospital Stay: Payer: Medicare HMO | Attending: Adult Health | Admitting: Adult Health

## 2021-05-01 NOTE — Progress Notes (Deleted)
SURVIVORSHIP VISIT:    BRIEF ONCOLOGIC HISTORY:  Oncology History  Atypical ductal hyperplasia of left breast  08/02/2020 Initial Diagnosis   Invasive ductal carcinoma of the left breast  Biopsy of left breast calcifications on 05/23/2016 showed atypical ductal hyperplasia. Diagnostic mammogram and [place] on 07/13/20 showed a 7 mm mass in the 10 o'clock position of the left breast. 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   Malignant neoplasm of upper-outer quadrant of left breast in female, estrogen receptor positive (HCC)  08/02/2020 Initial Diagnosis   Screening mammogram detected left breast mass measuring 7 mm at 10 o'clock position.  Biopsy revealed grade 2 IDC with DCIS with calcifications, ER 90%, PR 100%, Ki67 10%, HER2 negative   09/26/2020 Surgery   Left lumpectomy: Grade 1 IDC 0.8 cm, with DCIS low-grade, 0/5 lymph nodes, ER 90%, PR 100%, HER2 negative, Ki-67 10%   10/30/2020 - 11/28/2020 Radiation Therapy   Adj XRT at [facility]   12/24/2020 -  Anti-estrogen oral therapy   Letrozole 2.5 mg daily   03/23/2021 Cancer Staging   Staging form: Breast, AJCC 8th Edition - Pathologic: Stage IA (pT1b, pN0, cM0, G1, ER+, PR+, HER2-) - Signed by Loa Socks, NP on 03/23/2021 Histologic grading system: 3 grade system      INTERVAL HISTORY:  Ms. Whitton to review her survivorship care plan detailing her treatment course for breast cancer, as well as monitoring long-term side effects of that treatment, education regarding health maintenance, screening, and overall wellness and health promotion.     Overall, Ms. Stickney reports feeling quite well   REVIEW OF SYSTEMS:  Review of Systems  Constitutional:  Negative for appetite change, chills, fatigue, fever and unexpected weight change.  HENT:   Negative for hearing loss, lump/mass and trouble swallowing.   Eyes:  Negative for eye problems and icterus.   Respiratory:  Negative for chest tightness, cough and shortness of breath.   Cardiovascular:  Negative for chest pain, leg swelling and palpitations.  Gastrointestinal:  Negative for abdominal distention, abdominal pain, constipation, diarrhea, nausea and vomiting.  Endocrine: Negative for hot flashes.  Genitourinary:  Negative for difficulty urinating.   Musculoskeletal:  Negative for arthralgias.  Skin:  Negative for itching and rash.  Neurological:  Negative for dizziness, extremity weakness, headaches and numbness.  Hematological:  Negative for adenopathy. Does not bruise/bleed easily.  Psychiatric/Behavioral:  Negative for depression. The patient is not nervous/anxious.   Breast: Denies any new nodularity, masses, tenderness, nipple changes, or nipple discharge.      ONCOLOGY TREATMENT TEAM:  1. Surgeon:  Dr. Carolynne Edouard at [facility] 2. Medical Oncologist: Dr. Pamelia Hoit  3. Radiation Oncologist: Dr. Rushie Chestnut    PAST MEDICAL/SURGICAL HISTORY:  Past Medical History:  Diagnosis Date   Acne    dermatology - on doxy 100mg  daily   Anemia    Bursitis of right shoulder    History of atrial fibrillation 05/11/2005   h/o parox Afib, nl cardiolite/echo, no prolonged episodes.  metoprolol prn Tresa Endo)   HLD (hyperlipidemia)    Obesity    Panic attacks    Positive occult stool blood test 02/11/2012   s/p normal colonoscopy, rpt stool cards negative   Past Surgical History:  Procedure Laterality Date   ABDOMINAL HYSTERECTOMY  1990s   for fibroids, has one ovary remaining ([place]). partial   BREAST BIOPSY     BREAST LUMPECTOMY WITH RADIOACTIVE SEED AND SENTINEL LYMPH NODE BIOPSY Left 09/26/2020  Procedure: LEFT BREAST LUMPECTOMY WITH RADIOACTIVE SEED AND SENTINEL LYMPH NODE BIOPSY;  Surgeon: Griselda Miner, MD;  Location: [facility] OR;  Service: General;  Laterality: Left;   COLONOSCOPY  01/2012   mod diverticulosis, rec rpt stool cards and if + rec EGD (returned negative), rpt  colonoscopy 10 yrs     ALLERGIES:  Allergies  Allergen Reactions   Sulfa Antibiotics Rash     CURRENT MEDICATIONS:  Outpatient Encounter Medications as of 05/01/2021  Medication Sig   atorvastatin (LIPITOR) 20 MG tablet Take 1 tablet by mouth once daily   guaiFENesin-codeine (ROBITUSSIN AC) 100-10 MG/5ML syrup Take 5-10 mLs by mouth at bedtime as needed for cough.   latanoprost (XALATAN) 0.005 % ophthalmic solution Place 1 drop into the left eye at bedtime.   letrozole (FEMARA) 2.5 MG tablet Take 1 tablet (2.5 mg total) by mouth daily.   metFORMIN (GLUCOPHAGE) 500 MG tablet Take 1 tablet (500 mg total) by mouth 2 (two) times daily with a meal.   metoprolol tartrate (LOPRESSOR) 25 MG tablet Take 1 tablet (25 mg total) by mouth 2 (two) times daily as needed (heart fluttering).   predniSONE (DELTASONE) 20 MG tablet 3 tabs by mouth daily x 3 days, then 2 tabs by mouth daily x 2 days then 1 tab by mouth daily x 2 days   No facility-administered encounter medications on file as of 05/01/2021.     ONCOLOGIC FAMILY HISTORY:  Family History  Problem Relation Age of Onset   Hypertension Mother    Alcohol abuse Brother    Alcohol abuse Father    Diabetes Daughter    Cancer Neg Hx    Coronary artery disease Neg Hx    Stroke Neg Hx      SOCIAL HISTORY:  Social History   Socioeconomic History   Marital status: Married    Spouse name: Not on file   Number of children: Not on file   Years of education: Not on file   Highest education level: Not on file  Occupational History   Not on file  Tobacco Use   Smoking status: Never   Smokeless tobacco: Never  Vaping Use   Vaping Use: Never used  Substance and Sexual Activity   Alcohol use: Yes    Alcohol/week: 0.0 standard drinks    Comment: Rare   Drug use: No   Sexual activity: Yes    Comment: 1st intercourse 71 yo-More than 5 partners  Other Topics Concern   Not on file  Social History Narrative   Caffeine: none    Lives  with husband, has grown children, outside pets (husband is Product manager)    Occupation: Lawyer at [employer], works 2nd shift, [employer] on weekends    Activity: starting regular walking - 3 mi about 2d/wk    Diet: good water, fruits/vegetables daily    Social Determinants of Corporate investment banker Strain: Not on file  Food Insecurity: Not on file  Transportation Needs: Not on file  Physical Activity: Not on file  Stress: Not on file  Social Connections: Not on file  Intimate Partner Violence: Not on file     OBSERVATIONS/OBJECTIVE:  There were no vitals taken for this visit. GENERAL: Patient is a well appearing female in no acute distress HEENT:  Sclerae anicteric.  Oropharynx clear and moist. No ulcerations or evidence of oropharyngeal candidiasis. Neck is supple.  NODES:  No cervical, supraclavicular, or axillary lymphadenopathy palpated.  BREAST EXAM: Left breast status  postlumpectomy and radiation no sign of local recurrence right breast is benign. LUNGS:  Clear to auscultation bilaterally.  No wheezes or rhonchi. HEART:  Regular rate and rhythm. No murmur appreciated. ABDOMEN:  Soft, nontender.  Positive, normoactive bowel sounds. No organomegaly palpated. MSK:  No focal spinal tenderness to palpation. Full range of motion bilaterally in the upper extremities. EXTREMITIES:  No peripheral edema.   SKIN:  Clear with no obvious rashes or skin changes. No nail dyscrasia. NEURO:  Nonfocal. Well oriented.  Appropriate affect.   LABORATORY DATA:  None for this visit.  DIAGNOSTIC IMAGING:  None for this visit.      ASSESSMENT AND PLAN:  Ms.. Barona is a pleasant 71 y.o. female with Stage 1A left breast invasive ductal carcinoma, ER+/PR+/HER2-, diagnosed in June 2022, treated with lumpectomy, adjuvant radiation therapy, and anti-estrogen therapy with letrozole beginning in November 2022.  She presents to the Survivorship Clinic for our initial meeting and routine follow-up  post-completion of treatment for breast cancer.    1. Stage 1A left breast cancer:  Ms. Polsinelli is continuing to recover from definitive treatment for breast cancer. She will follow-up with her medical oncologist, Dr. Pamelia Hoit in 6 months with history and physical exam per surveillance protocol.  She will continue her anti-estrogen therapy with letrozole. Thus far, she is tolerating the letrozole well, with minimal side effects. She was instructed to make Dr. Pamelia Hoit or myself aware if she begins to experience any worsening side effects of the medication and I could see her back in clinic to help manage those side effects, as needed. Her mammogram is due 07/2021; orders placed today. Today, a comprehensive survivorship care plan and treatment summary was reviewed with the patient today detailing her breast cancer diagnosis, treatment course, potential late/long-term effects of treatment, appropriate follow-up care with recommendations for the future, and patient education resources.  A copy of this summary, along with a letter will be sent to the patients primary care provider via mail/fax/In Basket message after todays visit.    #. Problem(s) at Visit______________  #. Bone health:  Given Ms. Neas's age/history of breast cancer and her current treatment regimen including anti-estrogen therapy with letrozole, she is at risk for bone demineralization.  She was given education on specific activities to promote bone health.  #. Cancer screening:  Due to Ms. Lins's history and her age, she should receive screening for skin cancers, colon cancer, and gynecologic cancers.  The information and recommendations are listed on the patient's comprehensive care plan/treatment summary and were reviewed in detail with the patient.    #. Health maintenance and wellness promotion: Ms. Dagraca was encouraged to consume 5-7 servings of fruits and vegetables per day. We reviewed the "Nutrition Rainbow" handout.  She was also  encouraged to engage in moderate to vigorous exercise for 30 minutes per day most days of the week. We discussed the [employer] fitness program, which is designed for cancer survivors to help them become more physically fit after cancer treatments.  She was instructed to limit her alcohol consumption and continue to abstain from tobacco use.     #. Support services/counseling: It is not uncommon for this period of the patient's cancer care trajectory to be one of many emotions and stressors.  She was given information regarding our available services and encouraged to contact me with any questions or for help enrolling in any of our support group/programs.    Follow up instructions:    -Return to cancer center  6 months for follow-up with Dr. Pamelia Hoit -Mammogram due in June 2023 -Follow up with surgery 1 year -She is welcome to return back to the Survivorship Clinic at any time; no additional follow-up needed at this time.  -Consider referral back to survivorship as a long-term survivor for continued surveillance  The patient was provided an opportunity to ask questions and all were answered. The patient agreed with the plan and demonstrated an understanding of the instructions.   Total encounter time: *** minutes in face-to-face visit time, chart review, lab review, care coordination, order entry, and documentation of the encounter.  Lillard Anes, NP 05/01/21 8:22 AM Medical Oncology and Hematology [facility] [address] [place], [place] [address] Tel. [phone]    Fax. [phone]  *Total Encounter Time as defined by the Centers for Medicare and Medicaid Services includes, in addition to the face-to-face time of a patient visit (documented in the note above) non-face-to-face time: obtaining and reviewing outside history, ordering and reviewing medications, tests or procedures, care coordination (communications with other health care professionals or caregivers) and  documentation in the medical record.

## 2021-05-06 ENCOUNTER — Telehealth: Payer: Self-pay | Admitting: *Deleted

## 2021-05-10 ENCOUNTER — Telehealth: Payer: Self-pay | Admitting: *Deleted

## 2021-05-13 ENCOUNTER — Telehealth: Payer: Self-pay | Admitting: Adult Health

## 2021-05-13 NOTE — Telephone Encounter (Signed)
.  Called pt per 4/3 inbasket , Patient was unavailable, a message with appt time and date was left with number on file.   ?

## 2021-05-21 ENCOUNTER — Telehealth: Payer: Self-pay | Admitting: Adult Health

## 2021-05-21 NOTE — Telephone Encounter (Signed)
.  Called patient to schedule appointment per 4/11 inbasket, patient is aware of date and time.   ?

## 2021-05-22 ENCOUNTER — Encounter: Payer: Self-pay | Admitting: Family Medicine

## 2021-05-22 ENCOUNTER — Ambulatory Visit (INDEPENDENT_AMBULATORY_CARE_PROVIDER_SITE_OTHER): Payer: Medicare HMO | Admitting: Family Medicine

## 2021-05-22 VITALS — BP 118/74 | HR 64 | Temp 97.8°F | Ht 62.6 in | Wt 191.4 lb

## 2021-05-22 DIAGNOSIS — E66811 Obesity, class 1: Secondary | ICD-10-CM

## 2021-05-22 DIAGNOSIS — Z170 Estrogen receptor positive status [ER+]: Secondary | ICD-10-CM

## 2021-05-22 DIAGNOSIS — C50412 Malignant neoplasm of upper-outer quadrant of left female breast: Secondary | ICD-10-CM | POA: Diagnosis not present

## 2021-05-22 DIAGNOSIS — E119 Type 2 diabetes mellitus without complications: Secondary | ICD-10-CM

## 2021-05-22 DIAGNOSIS — Z7189 Other specified counseling: Secondary | ICD-10-CM

## 2021-05-22 DIAGNOSIS — E1169 Type 2 diabetes mellitus with other specified complication: Secondary | ICD-10-CM

## 2021-05-22 DIAGNOSIS — E785 Hyperlipidemia, unspecified: Secondary | ICD-10-CM

## 2021-05-22 DIAGNOSIS — E669 Obesity, unspecified: Secondary | ICD-10-CM

## 2021-05-22 DIAGNOSIS — Z0000 Encounter for general adult medical examination without abnormal findings: Secondary | ICD-10-CM

## 2021-05-22 LAB — COMPREHENSIVE METABOLIC PANEL
ALT: 13 U/L (ref 0–35)
AST: 13 U/L (ref 0–37)
Albumin: 4.1 g/dL (ref 3.5–5.2)
Alkaline Phosphatase: 140 U/L — ABNORMAL HIGH (ref 39–117)
BUN: 15 mg/dL (ref 6–23)
CO2: 30 mEq/L (ref 19–32)
Calcium: 9.3 mg/dL (ref 8.4–10.5)
Chloride: 103 mEq/L (ref 96–112)
Creatinine, Ser: 0.75 mg/dL (ref 0.40–1.20)
GFR: 81.18 mL/min (ref >60.00)
Glucose, Bld: 196 mg/dL — ABNORMAL HIGH (ref 70–99)
Potassium: 4 mEq/L (ref 3.5–5.1)
Sodium: 139 mEq/L (ref 135–145)
Total Bilirubin: 0.7 mg/dL (ref 0.2–1.2)
Total Protein: 6.9 g/dL (ref 6.0–8.3)

## 2021-05-22 LAB — CBC WITH DIFFERENTIAL/PLATELET
Basophils Absolute: 0.1 10*3/uL (ref 0.0–0.1)
Basophils Relative: 1.2 % (ref 0.0–3.0)
Eosinophils Absolute: 0.1 10*3/uL (ref 0.0–0.7)
Eosinophils Relative: 2.4 % (ref 0.0–5.0)
HCT: 36.1 % (ref 36.0–46.0)
Hemoglobin: 12 g/dL (ref 12.0–15.0)
Lymphocytes Relative: 22.2 % (ref 12.0–46.0)
Lymphs Abs: 1.3 10*3/uL (ref 0.7–4.0)
MCHC: 33.3 g/dL (ref 30.0–36.0)
MCV: 91 fl (ref 78.0–100.0)
Monocytes Absolute: 0.4 10*3/uL (ref 0.1–1.0)
Monocytes Relative: 7.2 % (ref 3.0–12.0)
Neutro Abs: 3.8 10*3/uL (ref 1.4–7.7)
Neutrophils Relative %: 67 % (ref 43.0–77.0)
Platelets: 254 10*3/uL (ref 150.0–400.0)
RBC: 3.97 Mil/uL (ref 3.87–5.11)
RDW: 13 % (ref 11.5–15.5)
WBC: 5.6 10*3/uL (ref 4.0–10.5)

## 2021-05-22 LAB — LIPID PANEL
Cholesterol: 175 mg/dL (ref 0–200)
HDL: 45.3 mg/dL (ref >39.00)
LDL Cholesterol: 117 mg/dL — ABNORMAL HIGH (ref 0–99)
NonHDL: 129.23
Total CHOL/HDL Ratio: 4
Triglycerides: 59 mg/dL (ref 0.0–149.0)
VLDL: 11.8 mg/dL (ref 0.0–40.0)

## 2021-05-22 LAB — MICROALBUMIN / CREATININE URINE RATIO
Creatinine,U: 114.1 mg/dL
Microalb Creat Ratio: 0.7 mg/g (ref 0.0–30.0)
Microalb, Ur: 0.8 mg/dL (ref 0.0–1.9)

## 2021-05-22 LAB — HEMOGLOBIN A1C: Hgb A1c MFr Bld: 9.1 % — ABNORMAL HIGH (ref 4.6–6.5)

## 2021-05-22 MED ORDER — VITAMIN D3 25 MCG (1000 UT) PO CAPS: 2.0000 | ORAL_CAPSULE | Freq: Every day | ORAL | Status: DC

## 2021-05-22 MED ORDER — ATORVASTATIN CALCIUM 20 MG PO TABS
20.0000 mg | ORAL_TABLET | Freq: Every day | ORAL | 3 refills | Status: DC
Start: 2021-05-22 — End: 2022-06-06

## 2021-05-22 NOTE — Assessment & Plan Note (Addendum)

## 2021-05-22 NOTE — Patient Instructions (Addendum)
Labs today  ?Hearing screen today.  ?We will request latest diabetic eye exam from [facility].  ?Touch base with oncology, but you should be able to call to schedule bone density scan at your convenience: [place] [phone].  ?If interested, check with pharmacy about new 2 shot shingles series (shingrix).  ?Advanced directive packet provided today.  ?Return in 6 months for diabetes check.  ? ?Health Maintenance After Age 70 ?After age 67, you are at a higher risk for certain long-term diseases and infections as well as injuries from falls. Falls are a major cause of broken bones and head injuries in people who are older than age 50. Getting regular preventive care can help to keep you healthy and well. Preventive care includes getting regular testing and making lifestyle changes as recommended by your health care provider. Talk with your health care provider about: ?Which screenings and tests you should have. A screening is a test that checks for a disease when you have no symptoms. ?A diet and exercise plan that is right for you. ?What should I know about screenings and tests to prevent falls? ?Screening and testing are the best ways to find a health problem early. Early diagnosis and treatment give you the best chance of managing medical conditions that are common after age 37. Certain conditions and lifestyle choices may make you more likely to have a fall. Your health care provider may recommend: ?Regular vision checks. Poor vision and conditions such as cataracts can make you more likely to have a fall. If you wear glasses, make sure to get your prescription updated if your vision changes. ?Medicine review. Work with your health care provider to regularly review all of the medicines you are taking, including over-the-counter medicines. Ask your health care provider about any side effects that may make you more likely to have a fall. Tell your health care provider if any  medicines that you take make you feel dizzy or sleepy. ?Strength and balance checks. Your health care provider may recommend certain tests to check your strength and balance while standing, walking, or changing positions. ?Foot health exam. Foot pain and numbness, as well as not wearing proper footwear, can make you more likely to have a fall. ?Screenings, including: ?Osteoporosis screening. Osteoporosis is a condition that causes the bones to get weaker and break more easily. ?Blood pressure screening. Blood pressure changes and medicines to control blood pressure can make you feel dizzy. ?Depression screening. You may be more likely to have a fall if you have a fear of falling, feel depressed, or feel unable to do activities that you used to do. ?Alcohol use screening. Using too much alcohol can affect your balance and may make you more likely to have a fall. ?Follow these instructions at home: ?Lifestyle ?Do not drink alcohol if: ?Your health care provider tells you not to drink. ?If you drink alcohol: ?Limit how much you have to: ?0-1 drink a day for women. ?0-2 drinks a day for men. ?Know how much alcohol is in your drink. In the [place] one drink equals one 12 oz bottle of beer (355 mL), one 5 oz glass of wine (148 mL), or one 1? oz glass of hard liquor (44 mL). ?Do not use any products that contain nicotine or tobacco. These products include cigarettes, chewing tobacco, and vaping devices, such as e-cigarettes. If you need help quitting, ask your health care provider. ?Activity ? ?Follow a regular exercise program to stay fit.  This will help you maintain your balance. Ask your health care provider what types of exercise are appropriate for you. ?If you need a cane or walker, use it as recommended by your health care provider. ?Wear supportive shoes that have nonskid soles. ?Safety ? ?Remove any tripping hazards, such as rugs, cords, and clutter. ?Install safety equipment such as grab bars in bathrooms and  safety rails on stairs. ?Keep rooms and walkways well-lit. ?General instructions ?Talk with your health care provider about your risks for falling. Tell your health care provider if: ?You fall. Be sure to tell your health care provider about all falls, even ones that seem minor. ?You feel dizzy, tiredness (fatigue), or off-balance. ?Take over-the-counter and prescription medicines only as told by your health care provider. These include supplements. ?Eat a healthy diet and maintain a healthy weight. A healthy diet includes low-fat dairy products, low-fat (lean) meats, and fiber from whole grains, beans, and lots of fruits and vegetables. ?Stay current with your vaccines. ?Schedule regular health, dental, and eye exams. ?Summary ?Having a healthy lifestyle and getting preventive care can help to protect your health and wellness after age 62. ?Screening and testing are the best way to find a health problem early and help you avoid having a fall. Early diagnosis and treatment give you the best chance for managing medical conditions that are more common for people who are older than age 77. ?Falls are a major cause of broken bones and head injuries in people who are older than age 105. Take precautions to prevent a fall at home. ?Work with your health care provider to learn what changes you can make to improve your health and wellness and to prevent falls. ?This information is not intended to replace advice given to you by your health care provider. Make sure you discuss any questions you have with your health care provider. ?Document Revised: 06/18/2020 Document Reviewed: 06/18/2020 ?Elsevier Patient Education ? [place]. ? ?

## 2021-05-22 NOTE — Assessment & Plan Note (Addendum)
Chronic, update A1c on metformin '500mg'$  BID. ?

## 2021-05-22 NOTE — Assessment & Plan Note (Addendum)
Chronic, update FLP on atorvastatin ?The 10-year ASCVD risk score (Arnett DK, et al., 2019) is: 17% ?  Values used to calculate the score: ?    Age: 71 years ?    Sex: Female ?    Is Non-Hispanic African American: Yes ?    Diabetic: Yes ?    Tobacco smoker: No ?    Systolic Blood Pressure: 410 mmHg ?    Is BP treated: No ?    HDL Cholesterol: 48.5 mg/dL ?    Total Cholesterol: 158 mg/dL  ?

## 2021-05-22 NOTE — Assessment & Plan Note (Signed)
S/p lumpectomy with SNL biopsy 09/2020 followed by whole breast radiation therapy and now on aromatase inhibitor Femara x5 yrs.  ?Appreciate surgery, oncology, rad/onc care.  ?

## 2021-05-22 NOTE — Assessment & Plan Note (Addendum)
Advanced planning - does not have set up. Doesn't know who HCPOA would be - probably daughter [name], but ideally husband and 2 daughters together. Packet provided today. Would want to be full code but no prolonged care if terminal condition.  ?

## 2021-05-22 NOTE — Assessment & Plan Note (Signed)
Preventative protocols reviewed and updated unless pt declined. Discussed healthy diet and lifestyle.  

## 2021-05-22 NOTE — Progress Notes (Signed)
? ? Patient ID: Brandy Guerrero, female    DOB: [birth year], 71 y.o.   MRN: [id number] ? ?This visit was conducted in person. ? ?BP 118/74 (BP Location: Left Arm, Patient Position: Sitting)   Pulse 64   Temp 97.8 ?F (36.6 ?C) (Skin)   Ht 5' 2.6" (1.59 m)   Wt 191 lb 6 oz (86.8 kg)   BMI 34.34 kg/m?   ? ?CC: CPE/AMW ?Subjective:  ? ?HPI: ?Brandy Guerrero is a 71 y.o. female presenting on 05/22/2021 for Annual Exam ? ? ?Did not see health advisor this year.  ?H/o needle phobia.  ? ?Hearing Screening  ? '500Hz'$  '1000Hz'$  '2000Hz'$  '4000Hz'$   ?Right ear '20 25 20 20  '$ ?Left ear '20 20 20 25  '$ ?Vision Screening - Comments:: Had eye exam recently on drops  ?[place] Office Visit from 05/22/2021 in [place] [facility]  ?PHQ-2 Total Score 0  ? ?  ?  ? ?  05/22/2021  ? 10:19 AM 04/23/2020  ?  3:02 PM 09/22/2018  ?  9:30 AM 04/28/2017  ?  2:08 PM  ?Fall Risk   ?Falls in the past year? 0 0 0 No  ?Follow up   Falls evaluation completed   ? ?Recent dx L stage Ia breast cancer (ER/PR positive) s/p wide local excision Brandy Guerrero) and SNL biopsy s/p adjuvant whole breast radiation - saw onc and rad/onc. Now on Femara anti-estrogen aromatase inhibitor planned for 5 yrs.  ? ?DM - she continues metformin '500mg'$  BID.  ? ?Preventative: ?Colon cancer screening - iFOB positive, s/p colonoscopy 01/2012 Brandy Guerrero) with diverticulosis, but no reason for bleed found. Rpt stool cards negative. Rec rpt colonoscopy 10 yrs.  ?Well woman with [name] - s/p TAH for fibroids, one ovary remains. Established with [name] 2020. Has not returned to see him.  ?Breast cancer - see above. Last mammogram 09/2020.  ?DEXA 2013 WNL - due to repeat this per onc ?Lung cancer screening - not eligible  ?Flu shot yearly  ?COVID vaccine Moderna 03/2019, 04/2019, booster 12/2019, bivalent 11/2020 ?Prevnar-13 - 04/2017, pneumovax 12/2018 ?Tdap 2013  ?Shingrix - discussed  ?Advanced planning - does not have set up. Doesn't know who HCPOA would be - probably daughter  Brandy Guerrero, but ideally husband and 2 daughters together. Packet provided today. Would want to be full code but no prolonged care if terminal condition.  ?Seat belt use discussed.  ?Sunscreen use discussed. No changing moles on skin.  ?Non smoker  ?Alcohol - rare  ?Dentist Q6 mo ?Eye exam - yearly ?Bowel - chronic constipation - herbalife supplement helps, actually better with current medicines ?Bladder - no incontinence  ?  ?Caffeine: none   ?Lives with husband, has grown children, outside pets (husband is Biomedical engineer) ?Occupation: CNA but currently working at [employer] ?Activity: starting regular walking - 3 mi about 2d/wk   ?Diet: good water, fruits/vegetables daily  ?   ? ?Relevant past medical, surgical, family and social history reviewed and updated as indicated. Interim medical history since our last visit reviewed. ?Allergies and medications reviewed and updated. ?Outpatient Medications Prior to Visit  ?Medication Sig Dispense Refill  ? guaiFENesin-codeine (ROBITUSSIN AC) 100-10 MG/5ML syrup Take 5-10 mLs by mouth at bedtime as needed for cough. 180 mL 0  ? latanoprost (XALATAN) 0.005 % ophthalmic solution Place 1 drop into the left eye at bedtime.    ? letrozole (FEMARA) 2.5 MG tablet Take 1 tablet (2.5 mg total) by mouth daily. [place]  tablet 3  ? metFORMIN (GLUCOPHAGE) 500 MG tablet Take 1 tablet (500 mg total) by mouth 2 (two) times daily with a meal. 180 tablet 3  ? metoprolol tartrate (LOPRESSOR) 25 MG tablet Take 1 tablet (25 mg total) by mouth 2 (two) times daily as needed (heart fluttering). 30 tablet 1  ? vitamin E (VITAMIN E-400) 180 MG (400 UNITS) capsule Take 400 Units by mouth daily. Take two by mouth daily    ? atorvastatin (LIPITOR) 20 MG tablet Take 1 tablet by mouth once daily 90 tablet 3  ? Fish Oil-Cholecalciferol (OMEGA-3 & VITAMIN D3 GUMMIES PO) Take by mouth. Take two by mouth daily    ? predniSONE (DELTASONE) 20 MG tablet 3 tabs by mouth daily x 3 days, then 2 tabs by mouth daily  x 2 days then 1 tab by mouth daily x 2 days (Patient not taking: Reported on 05/22/2021) 15 tablet 0  ? ?No facility-administered medications prior to visit.  ?  ? ?Per HPI unless specifically indicated in ROS section below ?Review of Systems  ?Constitutional:  Positive for fatigue (intermittent). Negative for activity change, appetite change, chills, fever and unexpected weight change.  ?HENT:  Negative for hearing loss.   ?Eyes:  Negative for visual disturbance.  ?Respiratory:  Negative for cough, chest tightness, shortness of breath and wheezing.   ?Cardiovascular:  Negative for chest pain, palpitations and leg swelling.  ?Gastrointestinal:  Negative for abdominal distention, abdominal pain, blood in stool, constipation, diarrhea, nausea and vomiting.  ?Genitourinary:  Negative for difficulty urinating and hematuria.  ?Musculoskeletal:  Negative for arthralgias, myalgias and neck pain.  ?Skin:  Negative for rash.  ?Neurological:  Negative for dizziness, seizures, syncope and headaches.  ?Hematological:  Negative for adenopathy. Does not bruise/bleed easily.  ?Psychiatric/Behavioral:  Negative for dysphoric mood. The patient is not nervous/anxious.   ? ?Objective:  ?BP 118/74 (BP Location: Left Arm, Patient Position: Sitting)   Pulse 64   Temp 97.8 ?F (36.6 ?C) (Skin)   Ht 5' 2.6" (1.59 m)   Wt 191 lb 6 oz (86.8 kg)   BMI 34.34 kg/m?   ?Wt Readings from Last 3 Encounters:  ?05/22/21 191 lb 6 oz (86.8 kg)  ?12/26/20 195 lb 14.4 oz (88.9 kg)  ?12/24/20 195 lb 4.8 oz (88.6 kg)  ?  ?  ?Physical Exam ?Vitals and nursing note reviewed.  ?Constitutional:   ?   Appearance: Normal appearance. She is not ill-appearing.  ?HENT:  ?   Head: Normocephalic and atraumatic.  ?   Right Ear: Tympanic membrane, ear canal and external ear normal. There is no impacted cerumen.  ?   Left Ear: Tympanic membrane, ear canal and external ear normal. There is no impacted cerumen.  ?Eyes:  ?   General:     ?   Right eye: No discharge.      ?   Left eye: No discharge.  ?   Extraocular Movements: Extraocular movements intact.  ?   Conjunctiva/sclera: Conjunctivae normal.  ?   Pupils: Pupils are equal, round, and reactive to light.  ?Neck:  ?   Thyroid: No thyroid mass or thyromegaly.  ?   Vascular: No carotid bruit.  ?Cardiovascular:  ?   Rate and Rhythm: Normal rate and regular rhythm.  ?   Pulses: Normal pulses.  ?   Heart sounds: Murmur (2/6 systolic) heard.  ?Pulmonary:  ?   Effort: Pulmonary effort is normal. No respiratory distress.  ?   Breath sounds: Normal breath  sounds. No wheezing, rhonchi or rales.  ?Abdominal:  ?   General: Bowel sounds are normal. There is no distension.  ?   Palpations: Abdomen is soft. There is no mass.  ?   Tenderness: There is no abdominal tenderness. There is no guarding or rebound.  ?   Hernia: No hernia is present.  ?Musculoskeletal:  ?   Cervical back: Normal range of motion and neck supple. No rigidity.  ?   Right lower leg: No edema.  ?   Left lower leg: No edema.  ?Lymphadenopathy:  ?   Cervical: No cervical adenopathy.  ?Skin: ?   General: Skin is warm and dry.  ?   Findings: No rash.  ?Neurological:  ?   General: No focal deficit present.  ?   Mental Status: She is alert. Mental status is at baseline.  ?   Comments:  ?Recall 3/3 ?Calculation 5/5 DLROW  ?Psychiatric:     ?   Mood and Affect: Mood normal.     ?   Behavior: Behavior normal.  ? ?   ?Results for orders placed or performed in visit on 12/05/20  ?Fructosamine  ?Result Value Ref Range  ? Fructosamine 297 (H) 205 - 285 umol/L  ?Basic metabolic panel  ?Result Value Ref Range  ? Sodium 139 135 - 145 mEq/L  ? Potassium 4.1 3.5 - 5.1 mEq/L  ? Chloride 103 96 - 112 mEq/L  ? CO2 30 19 - 32 mEq/L  ? Glucose, Bld 166 (H) 70 - 99 mg/dL  ? BUN 12 6 - 23 mg/dL  ? Creatinine, Ser 0.69 0.40 - 1.20 mg/dL  ? GFR 88.78 >60.00 mL/min  ? Calcium 9.3 8.4 - 10.5 mg/dL  ? ? ?Assessment & Plan:  ? ?Problem List Items Addressed This Visit   ? ? Health maintenance  examination (Chronic)  ?  Preventative protocols reviewed and updated unless pt declined. ?Discussed healthy diet and lifestyle.  ?  ?  ? Advanced care planning/counseling discussion (Chronic)  ?  Advanced planning - does no

## 2021-05-22 NOTE — Assessment & Plan Note (Signed)
Encouraged healthy diet and lifestyle choices. 

## 2021-05-29 ENCOUNTER — Encounter (HOSPITAL_COMMUNITY): Payer: Self-pay

## 2021-05-29 ENCOUNTER — Ambulatory Visit
Admission: RE | Admit: 2021-05-29 | Discharge: 2021-05-29 | Disposition: A | Discharge status: Home / Self Care | Payer: Medicare HMO | Source: Ambulatory Visit | Attending: Radiation Oncology | Admitting: Radiation Oncology

## 2021-05-29 ENCOUNTER — Encounter: Payer: Self-pay | Admitting: Radiation Oncology

## 2021-05-29 ENCOUNTER — Other Ambulatory Visit: Payer: Self-pay | Admitting: Family Medicine

## 2021-05-29 VITALS — BP 134/75 | HR 77 | Temp 98.4°F | Resp 18 | Ht 62.5 in | Wt 190.2 lb

## 2021-05-29 DIAGNOSIS — C50412 Malignant neoplasm of upper-outer quadrant of left female breast: Secondary | ICD-10-CM | POA: Insufficient documentation

## 2021-05-29 DIAGNOSIS — Z923 Personal history of irradiation: Secondary | ICD-10-CM | POA: Insufficient documentation

## 2021-05-29 DIAGNOSIS — Z170 Estrogen receptor positive status [ER+]: Secondary | ICD-10-CM | POA: Diagnosis not present

## 2021-05-29 DIAGNOSIS — Z79811 Long term (current) use of aromatase inhibitors: Secondary | ICD-10-CM | POA: Insufficient documentation

## 2021-05-29 DIAGNOSIS — Z08 Encounter for follow-up examination after completed treatment for malignant neoplasm: Secondary | ICD-10-CM | POA: Diagnosis not present

## 2021-05-29 MED ORDER — METFORMIN HCL 500 MG PO TABS: 500.0000 mg | ORAL_TABLET | Freq: Three times a day (TID) | ORAL | 3 refills | Status: DC

## 2021-05-29 NOTE — Progress Notes (Signed)
Radiation Oncology ?Follow up Note ? ?Name: Brandy Guerrero   ?Date:   05/29/2021 ?MRN:  [id number] ?DOB: Jan 22, 1951  ? ? ?This 71 y.o. female presents to the clinic today for 80-monthfollow-up status post whole breast radiation to her left breast for a T1 invasive mammary carcinoma ER positive. ? ?REFERRING PROVIDER: GRia Bush MD ? ?HPI: Patient is a 71year old female now out 6 months having completed whole breast radiation to her left breast for stage I invasive mammary carcinoma ER positive seen today in routine follow-up she is doing well she specifically denies breast tenderness cough or bone pain.  She feels like she has another UTI and I am refilling her antibiotics..  She has been on Femara although she is discussing with medical oncology possibly changing that prescription.  No mammograms have been performed ? ?COMPLICATIONS OF TREATMENT: none ? ?FOLLOW UP COMPLIANCE: keeps appointments  ? ?PHYSICAL EXAM:  ?BP 134/75   Pulse 77   Temp 98.4 ?F (36.9 ?C)   Resp 18   Ht 5' 2.5" (1.588 m)   Wt 190 lb 3.2 oz (86.3 kg)   BMI 34.23 kg/m?  ?Lungs are clear to A&P cardiac examination essentially unremarkable with regular rate and rhythm. No dominant mass or nodularity is noted in either breast in 2 positions examined. Incision is well-healed. No axillary or supraclavicular adenopathy is appreciated. Cosmetic result is excellent.  Well-developed well-nourished patient in NAD. HEENT reveals PERLA, EOMI, discs not visualized.  Oral cavity is clear. No oral mucosal lesions are identified. Neck is clear without evidence of cervical or supraclavicular adenopathy. Lungs are clear to A&P. Cardiac examination is essentially unremarkable with regular rate and rhythm without murmur rub or thrill. Abdomen is benign with no organomegaly or masses noted. Motor sensory and DTR levels are equal and symmetric in the upper and lower extremities. Cranial nerves II through XII are grossly intact. Proprioception is intact.  No peripheral adenopathy or edema is identified. No motor or sensory levels are noted. Crude visual fields are within normal range. ? ?RADIOLOGY RESULTS: No current films for review ? ?PLAN: Present time patient is doing well 6 months out from whole breast radiation.  She will be discussing her antiestrogen therapy with medical oncology she is having some side effects from her current Femara.  I h am also refilling her antibiotics for her UTI empirically.  I have asked to see her back in 6 months for follow-up.  Patient knows to call with any concerns. ? ?I would like to take this opportunity to thank you for allowing me to participate in the care of your patient.. ?  ? GNoreene Filbert MD ? ?

## 2021-06-21 ENCOUNTER — Telehealth: Payer: Self-pay | Admitting: *Deleted

## 2021-06-24 ENCOUNTER — Other Ambulatory Visit: Payer: Self-pay

## 2021-06-24 ENCOUNTER — Inpatient Hospital Stay: Payer: Medicare HMO | Attending: Adult Health | Admitting: Adult Health

## 2021-06-24 VITALS — BP 135/73 | HR 80 | Temp 97.8°F | Resp 16 | Ht 62.0 in | Wt 189.1 lb

## 2021-06-24 DIAGNOSIS — Z170 Estrogen receptor positive status [ER+]: Secondary | ICD-10-CM | POA: Diagnosis not present

## 2021-06-24 DIAGNOSIS — Z79811 Long term (current) use of aromatase inhibitors: Secondary | ICD-10-CM | POA: Diagnosis not present

## 2021-06-24 DIAGNOSIS — Z923 Personal history of irradiation: Secondary | ICD-10-CM | POA: Insufficient documentation

## 2021-06-24 DIAGNOSIS — Z79899 Other long term (current) drug therapy: Secondary | ICD-10-CM | POA: Insufficient documentation

## 2021-06-24 DIAGNOSIS — C50412 Malignant neoplasm of upper-outer quadrant of left female breast: Secondary | ICD-10-CM | POA: Diagnosis not present

## 2021-06-24 NOTE — Progress Notes (Signed)
SURVIVORSHIP  VISIT: ? ? ?BRIEF ONCOLOGIC HISTORY:  ?Oncology History  ?Atypical ductal hyperplasia of left breast  ?08/02/2020 Initial Diagnosis  ? Invasive ductal carcinoma of the left breast ? ?Biopsy of left breast calcifications on 05/23/2016 showed atypical ductal hyperplasia. Diagnostic mammogram and [place] on 07/13/20 showed a 7 mm mass in the 10 o'clock position of the left breast. 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 ?  ?Malignant neoplasm of upper-outer quadrant of left breast in female, estrogen receptor positive ([place])  ?08/02/2020 Initial Diagnosis  ? Screening mammogram detected left breast mass measuring 7 mm at 10 o'clock position.  Biopsy revealed grade 2 IDC with DCIS with calcifications, ER 90%, PR 100%, Ki67 10%, HER2 negative ?  ?09/26/2020 Surgery  ? Left lumpectomy: Grade 1 IDC 0.8 cm, with DCIS low-grade, 0/5 lymph nodes, ER 90%, PR 100%, HER2 negative, Ki-67 10% ?  ?10/30/2020 - 11/28/2020 Radiation Therapy  ? Adj XRT at [facility] ?  ?12/24/2020 -  Anti-estrogen oral therapy  ? Letrozole 2.5 mg daily ?  ?03/23/2021 Cancer Staging  ? Staging form: Breast, AJCC 8th Edition ?- Pathologic: Stage IA (pT1b, pN0, cM0, G1, ER+, PR+, HER2-) - Signed by Gardenia Phlegm, NP on 03/23/2021 ?Histologic grading system: 3 grade system ? ?  ? ? ?INTERVAL HISTORY:  ?Brandy Guerrero to review her survivorship care plan detailing her treatment course for breast cancer, as well as monitoring long-term side effects of that treatment, education regarding health maintenance, screening, and overall wellness and health promotion.    ? ?Overall, Brandy Guerrero reports feeling quite well.  She is taking letrozole daily.  She tolerates this well and denies any arthralgias, hot flashes, or vaginal dryness. ? ?REVIEW OF SYSTEMS:  ?Review of Systems  ?Constitutional:  Negative for appetite change, chills, fatigue, fever and unexpected weight change.   ?HENT:   Negative for hearing loss, lump/mass and trouble swallowing.   ?Eyes:  Negative for eye problems and icterus.  ?Respiratory:  Negative for chest tightness, cough and shortness of breath.   ?Cardiovascular:  Negative for chest pain, leg swelling and palpitations.  ?Gastrointestinal:  Negative for abdominal distention, abdominal pain, constipation, diarrhea, nausea and vomiting.  ?Endocrine: Negative for hot flashes.  ?Genitourinary:  Negative for difficulty urinating.   ?Musculoskeletal:  Negative for arthralgias.  ?Skin:  Negative for itching and rash.  ?Neurological:  Negative for dizziness, extremity weakness, headaches and numbness.  ?Hematological:  Negative for adenopathy. Does not bruise/bleed easily.  ?Psychiatric/Behavioral:  Negative for depression. The patient is not nervous/anxious.   ?Breast: Denies any new nodularity, masses, tenderness, nipple changes, or nipple discharge.  ? ? ? ? ?ONCOLOGY TREATMENT TEAM:  ?1. Surgeon:  Dr. [name] at [facility] ?2. Medical Oncologist: Dr. Lindi Adie  ?3. Radiation Oncologist: Dr. Baruch Gouty ?  ? ?PAST MEDICAL/SURGICAL HISTORY:  ?Past Medical History:  ?Diagnosis Date  ? Acne   ? dermatology - on doxy 166m daily  ? Anemia   ? Bursitis of right shoulder   ? History of atrial fibrillation 05/11/2005  ? h/o parox Afib, nl cardiolite/echo, no prolonged episodes.  metoprolol prn ([facility]  ? HLD (hyperlipidemia)   ? Obesity   ? Panic attacks   ? Positive occult stool blood test 02/11/2012  ? s/p normal colonoscopy, rpt stool cards negative  ? ?Past Surgical History:  ?Procedure Laterality Date  ? ABDOMINAL HYSTERECTOMY  1990s  ? for fibroids, has one ovary remaining ([place]). partial  ?  BREAST BIOPSY    ? BREAST LUMPECTOMY WITH RADIOACTIVE SEED AND SENTINEL LYMPH NODE BIOPSY Left 09/26/2020  ? Procedure: LEFT BREAST LUMPECTOMY WITH RADIOACTIVE SEED AND SENTINEL LYMPH NODE BIOPSY;  Surgeon: Jovita Kussmaul, MD;  Location: [facility];  Service: General;   Laterality: Left;  ? COLONOSCOPY  01/2012  ? mod diverticulosis, rec rpt stool cards and if + rec EGD (returned negative), rpt colonoscopy 10 yrs  ? ? ? ?ALLERGIES:  ?Allergies  ?Allergen Reactions  ? Sulfa Antibiotics Rash  ? ? ? ?CURRENT MEDICATIONS:  ?Outpatient Encounter Medications as of 06/24/2021  ?Medication Sig  ? atorvastatin (LIPITOR) 20 MG tablet Take 1 tablet (20 mg total) by mouth daily.  ? Cholecalciferol (VITAMIN D3) 25 MCG (1000 UT) CAPS Take 2 capsules (2,000 Units total) by mouth daily.  ? latanoprost (XALATAN) 0.005 % ophthalmic solution Place 1 drop into the left eye at bedtime.  ? letrozole (FEMARA) 2.5 MG tablet Take 1 tablet (2.5 mg total) by mouth daily.  ? metFORMIN (GLUCOPHAGE) 500 MG tablet Take 1 tablet (500 mg total) by mouth in the morning, at noon, and at bedtime.  ? metoprolol tartrate (LOPRESSOR) 25 MG tablet Take 1 tablet (25 mg total) by mouth 2 (two) times daily as needed (heart fluttering).  ? vitamin E 180 MG (400 UNITS) capsule Take 400 Units by mouth daily. Take two by mouth daily  ? [DISCONTINUED] guaiFENesin-codeine (ROBITUSSIN AC) 100-10 MG/5ML syrup Take 5-10 mLs by mouth at bedtime as needed for cough.  ? ?No facility-administered encounter medications on file as of 06/24/2021.  ? ? ? ?ONCOLOGIC FAMILY HISTORY:  ?Family History  ?Problem Relation Age of Onset  ? Hypertension Mother   ? Alcohol abuse Brother   ? Alcohol abuse Father   ? Diabetes Daughter   ? Cancer Neg Hx   ? Coronary artery disease Neg Hx   ? Stroke Neg Hx   ? ? ?SOCIAL HISTORY:  ?Social History  ? ?Socioeconomic History  ? Marital status: Married  ?  Spouse name: Not on file  ? Number of children: Not on file  ? Years of education: Not on file  ? Highest education level: Not on file  ?Occupational History  ? Not on file  ?Tobacco Use  ? Smoking status: Never  ? Smokeless tobacco: Never  ?Vaping Use  ? Vaping Use: Never used  ?Substance and Sexual Activity  ? Alcohol use: Yes  ?  Alcohol/week: 0.0 standard  drinks  ?  Comment: Rare  ? Drug use: No  ? Sexual activity: Yes  ?  Comment: 1st intercourse 71 yo-More than 5 partners  ?Other Topics Concern  ? Not on file  ?Social History Narrative  ? Caffeine: none   ? Lives with husband, has grown children, outside pets (husband is Biomedical engineer)   ? Occupation: CNA at [employer], works 2nd shift, [employer] on weekends   ? Activity: starting regular walking - 3 mi about 2d/wk   ? Diet: good water, fruits/vegetables daily   ? ?Social Determinants of Health  ? ?Financial Resource Strain: Not on file  ?Food Insecurity: Not on file  ?Transportation Needs: Not on file  ?Physical Activity: Not on file  ?Stress: Not on file  ?Social Connections: Not on file  ?Intimate Partner Violence: Not on file  ? ? ? ?OBSERVATIONS/OBJECTIVE:  ?BP 135/73 (BP Location: Left Arm, Patient Position: Sitting)   Pulse 80   Temp 97.8 ?F (36.6 ?C) (Temporal)   Resp 16  Ht _0  (1.575 m)   Wt 189 lb 1.6 oz (85.8 kg)   SpO2 98%   BMI 34.59 kg/m?  ?GENERAL: Patient is a well appearing female in no acute distress ?HEENT:  Sclerae anicteric.  Oropharynx clear and moist. No ulcerations or evidence of oropharyngeal candidiasis. Neck is supple.  ?NODES:  No cervical, supraclavicular, or axillary lymphadenopathy palpated.  ?BREAST EXAM: Left breast status postlumpectomy and radiation no sign of local recurrence right breast is benign. ?LUNGS:  Clear to auscultation bilaterally.  No wheezes or rhonchi. ?HEART:  Regular rate and rhythm. No murmur appreciated. ?ABDOMEN:  Soft, nontender.  Positive, normoactive bowel sounds. No organomegaly palpated. ?MSK:  No focal spinal tenderness to palpation. Full range of motion bilaterally in the upper extremities. ?EXTREMITIES:  No peripheral edema.   ?SKIN:  Clear with no obvious rashes or skin changes. No nail dyscrasia. ?NEURO:  Nonfocal. Well oriented.  Appropriate affect. ? ?LABORATORY DATA:  ?None for this visit. ? ?DIAGNOSTIC IMAGING:  ?None for this visit.   ? ?  ? ?ASSESSMENT AND PLAN:  ?Brandy Guerrero is a pleasant 71 y.o. female with Stage 1A left breast invasive ductal carcinoma, ER+/PR+/HER2-, diagnosed in June 2022, treated with lumpectomy, adjuvant radiation therapy, and

## 2021-06-25 ENCOUNTER — Telehealth: Payer: Self-pay | Admitting: Hematology and Oncology

## 2021-06-25 NOTE — Telephone Encounter (Signed)
Scheduled appointment per 5/15 los. Left message. ?

## 2021-07-23 IMAGING — MG DIGITAL DIAGNOSTIC BILAT W/ TOMO W/ CAD
8 series · 8 of 24 positions shown · non-contrast
Comparison: Previous exam(s).

CLINICAL DATA: Follow-up atypical ductal hyperplasia found on
stereotactic guided core needle biopsy of left breast calcifications
on 05/23/2016. This was not excised.

EXAM:
DIGITAL DIAGNOSTIC BILATERAL MAMMOGRAM WITH TOMOSYNTHESIS AND CAD;
ULTRASOUND LEFT BREAST LIMITED
TECHNIQUE: Bilateral digital diagnostic mammography and breast tomosynthesis
was performed. The images were evaluated with computer-aided
detection.; Targeted ultrasound examination of the left breast was
performed

[R MLO synth-2D]
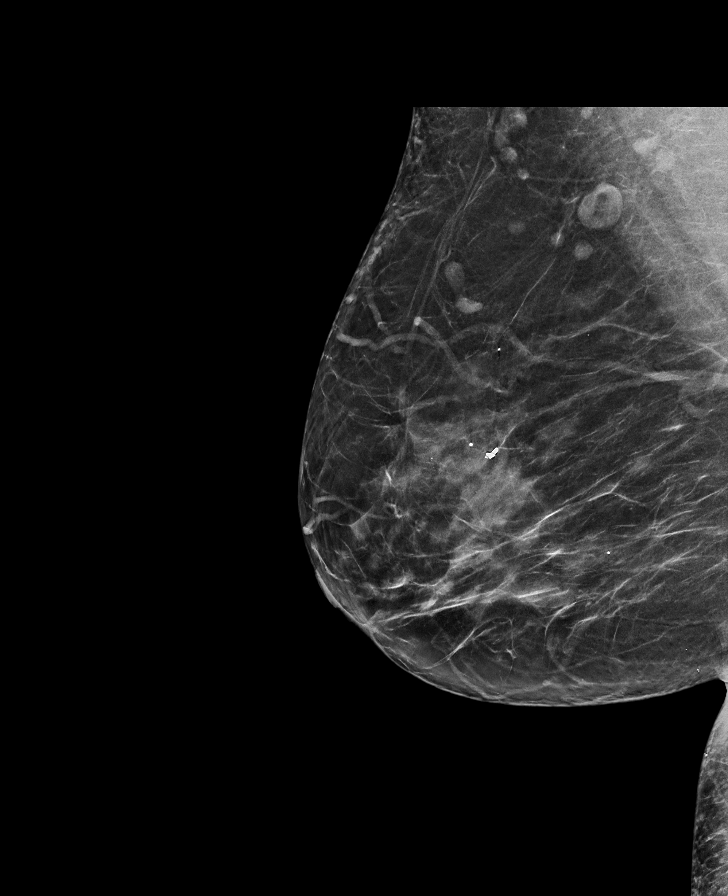

[R CC synth-2D]
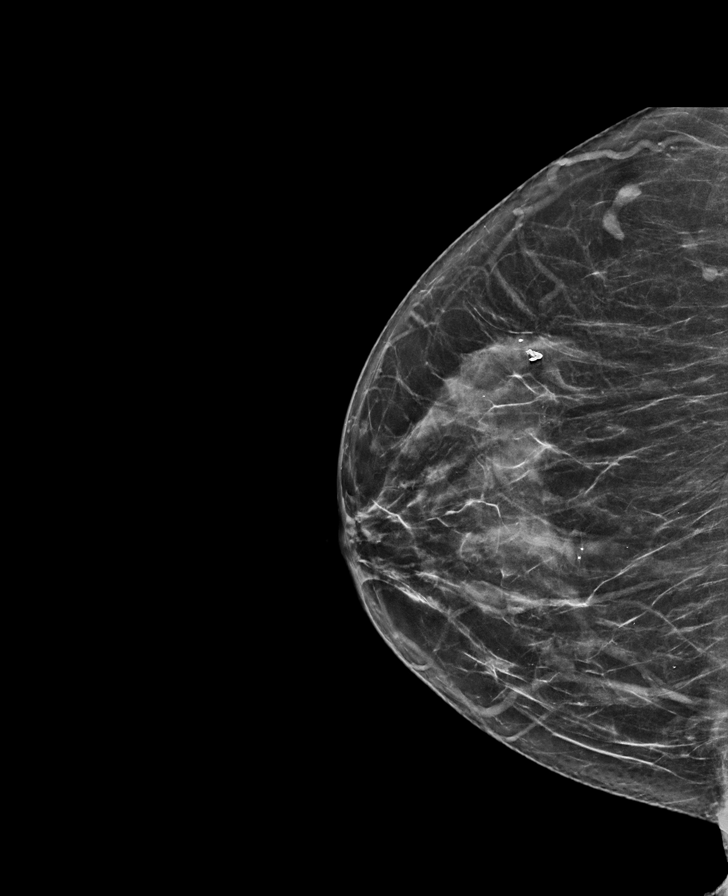

[L MLO synth-2D]
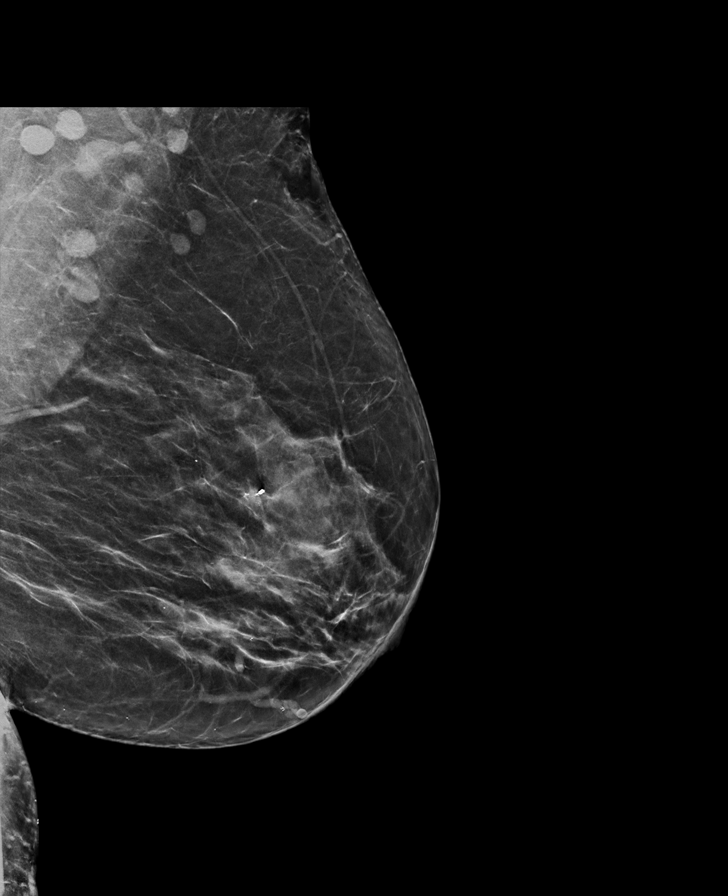

[L CC synth-2D]
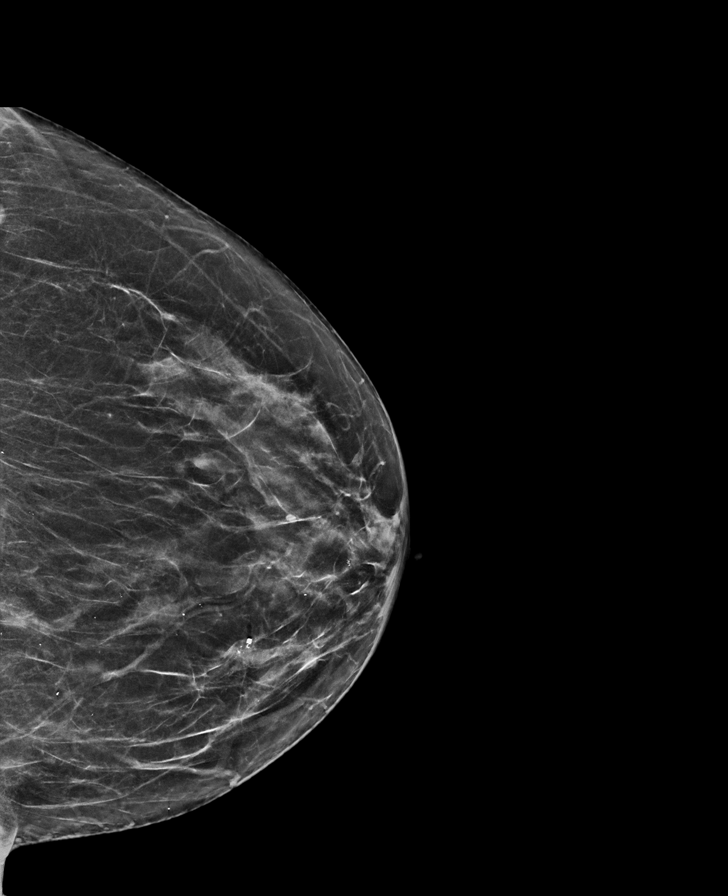

[R MLO tomo · tomo slice 37/73.0]
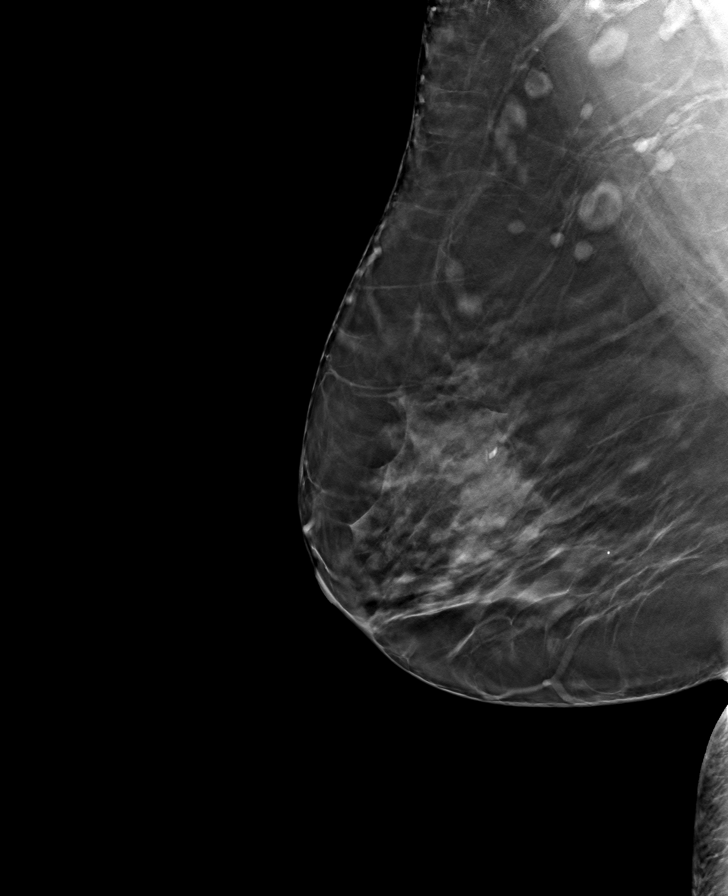

[R CC tomo · tomo slice 33/64.0]
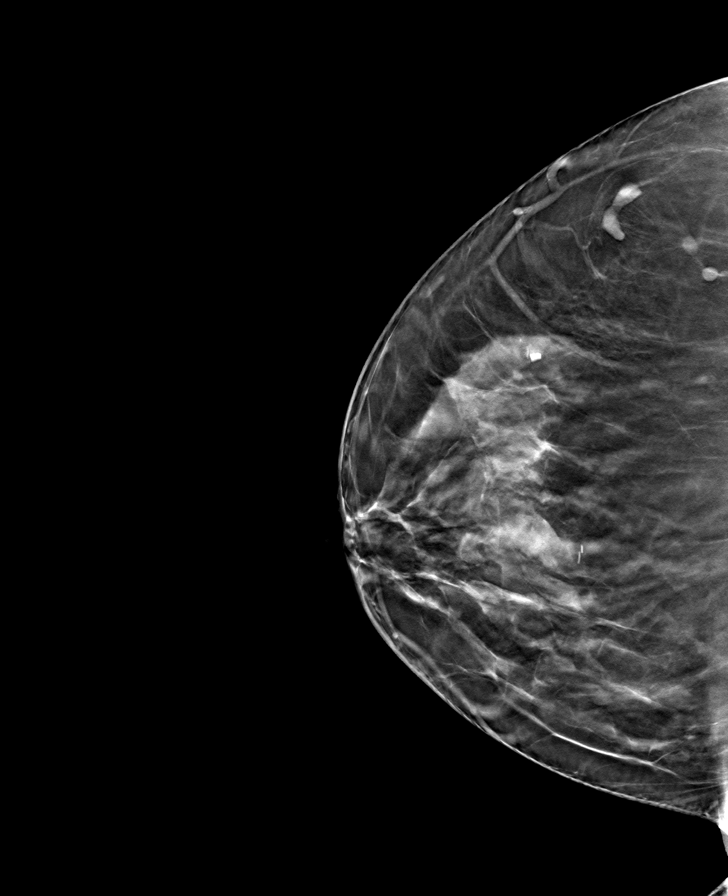

[L CC tomo · tomo slice 31/61.0]
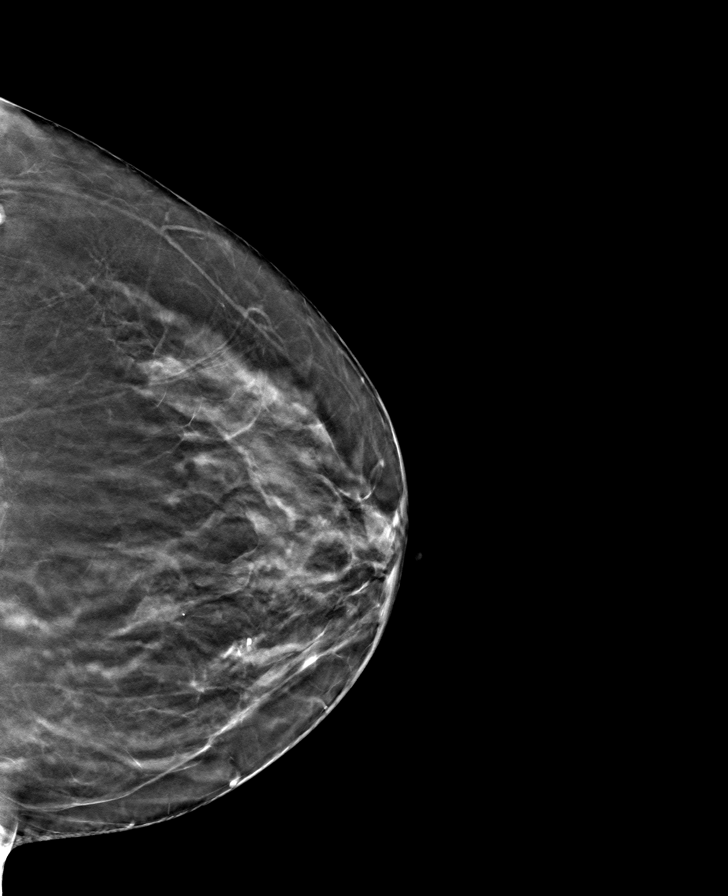

[L MLO tomo · tomo slice 33/66.0]
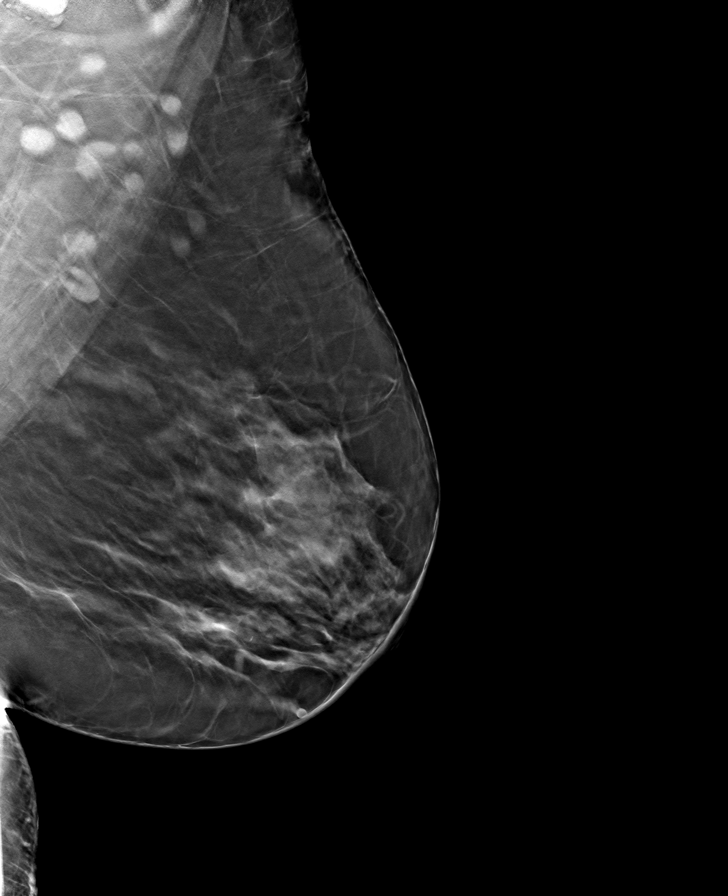

[8 of 24 positions shown; findings below may reference images not displayed]

ACR Breast Density Category c: The breast tissue is heterogeneously
dense, which may obscure small masses.
FINDINGS: There is an interval irregular, mass-like density with associated
distortion in the upper inner left breast at the location of the
previously biopsied calcifications containing a coil shaped biopsy
marker clip. No findings elsewhere in either breast suspicious for
malignancy.

On physical exam, no mass is palpable in the upper inner left breast
or left axilla.

Targeted ultrasound is performed, showing a 7 x 7 x 6 mm irregular,
hypoechoic mass containing internal calcifications in the 10 o'clock
position of the left breast, 5 cm from the nipple. This has
indistinct margins and corresponds to the mammographic mass.

Ultrasound of the left axilla demonstrated normal appearing left
axillary lymph nodes.
IMPRESSION: 7 mm mass in the 10 o'clock position of the left breast with imaging
features suspicious for malignancy.

RECOMMENDATION:
Ultrasound-guided core needle biopsy of the 7 mm mass in the 10
o'clock position of the left breast. This has been discussed with
the patient and scheduled at [DATE] p.m. on 08/02/2020.

I have discussed the findings and recommendations with the patient.
If applicable, a reminder letter will be sent to the patient
regarding the next appointment.

BI-RADS CATEGORY  4: Suspicious.

## 2021-07-24 ENCOUNTER — Ambulatory Visit
Admission: RE | Admit: 2021-07-24 | Discharge: 2021-07-24 | Disposition: A | Discharge status: Home / Self Care | Payer: Medicare HMO | Source: Ambulatory Visit | Attending: Hematology and Oncology | Admitting: Hematology and Oncology

## 2021-07-24 DIAGNOSIS — Z780 Asymptomatic menopausal state: Secondary | ICD-10-CM | POA: Insufficient documentation

## 2021-07-24 DIAGNOSIS — Z781 Physical restraint status: Secondary | ICD-10-CM | POA: Diagnosis not present

## 2021-07-25 ENCOUNTER — Telehealth: Payer: Self-pay | Admitting: *Deleted

## 2021-07-25 NOTE — Telephone Encounter (Signed)
Per MD request RN attempt x1 to contact pt regarding normal recent bone density scan.  No answer, LVM for pt to return call to the office.

## 2021-07-26 ENCOUNTER — Encounter: Payer: Self-pay | Admitting: Obstetrics and Gynecology

## 2021-08-21 ENCOUNTER — Encounter: Payer: Medicare HMO | Admitting: Obstetrics and Gynecology

## 2021-08-26 DIAGNOSIS — H2513 Age-related nuclear cataract, bilateral: Secondary | ICD-10-CM | POA: Diagnosis not present

## 2021-08-26 DIAGNOSIS — H40052 Ocular hypertension, left eye: Secondary | ICD-10-CM | POA: Diagnosis not present

## 2021-08-26 DIAGNOSIS — E119 Type 2 diabetes mellitus without complications: Secondary | ICD-10-CM | POA: Diagnosis not present

## 2021-08-26 DIAGNOSIS — H43813 Vitreous degeneration, bilateral: Secondary | ICD-10-CM | POA: Diagnosis not present

## 2021-08-26 LAB — HM DIABETES EYE EXAM

## 2021-09-18 ENCOUNTER — Ambulatory Visit: Payer: Medicare HMO | Admitting: Dermatology

## 2021-09-19 ENCOUNTER — Encounter: Payer: Self-pay | Admitting: Family Medicine

## 2021-09-25 ENCOUNTER — Ambulatory Visit: Payer: Medicare HMO | Admitting: Dermatology

## 2021-09-26 ENCOUNTER — Other Ambulatory Visit: Payer: Self-pay

## 2021-09-26 NOTE — Patient Outreach (Signed)
[facility]) Care Management  09/26/2021  Brandy Guerrero [birth year] [id number]   Telephone Screen    Outreach call to patient to introduce [facility] services and assess care needs as part of benefit of PCP office and insurance plan. Spoke briefly with patient as she states she was currently at work.    Main healthcare issue/concern today: None per patient. States she is doing well. She is still able to work everyday. Discussed and reviewed DM mgmt with patient. She admits that she refuses to check blood sugars and monitor them. She goes for A1C level testing every couple of months. Patient does not like to prick fingers and states she will not ever do-not interested in obtaining meter.    Health Maintenance/Care Gaps Addressed & Education Provided:  -Last AWV: 05/22/21 per EMR and KPN -Mammogram-pt has an upcoming appt    Enzo Montgomery, RN,BSN,CCM [place] [facility] Telephonic Care Management Coordinator Direct Phone: [phone] Toll Free: [phone] Fax: [phone]

## 2021-10-02 ENCOUNTER — Ambulatory Visit
Admission: RE | Admit: 2021-10-02 | Discharge: 2021-10-02 | Disposition: A | Discharge status: Home / Self Care | Payer: Medicare HMO | Source: Ambulatory Visit | Attending: Adult Health | Admitting: Adult Health

## 2021-10-02 DIAGNOSIS — R922 Inconclusive mammogram: Secondary | ICD-10-CM | POA: Diagnosis not present

## 2021-10-02 DIAGNOSIS — Z853 Personal history of malignant neoplasm of breast: Secondary | ICD-10-CM | POA: Diagnosis not present

## 2021-10-02 DIAGNOSIS — Z170 Estrogen receptor positive status [ER+]: Secondary | ICD-10-CM

## 2021-10-10 ENCOUNTER — Encounter: Payer: Medicare HMO | Admitting: Obstetrics and Gynecology

## 2021-10-16 ENCOUNTER — Encounter: Payer: Self-pay | Admitting: Dermatology

## 2021-10-16 ENCOUNTER — Ambulatory Visit: Payer: Medicare HMO | Admitting: Dermatology

## 2021-10-16 DIAGNOSIS — L219 Seborrheic dermatitis, unspecified: Secondary | ICD-10-CM | POA: Diagnosis not present

## 2021-10-16 DIAGNOSIS — L649 Androgenic alopecia, unspecified: Secondary | ICD-10-CM | POA: Diagnosis not present

## 2021-10-16 MED ORDER — CLINDAMYCIN PHOSPHATE 1 % EX SOLN: CUTANEOUS | 5 refills | Status: DC

## 2021-10-16 MED ORDER — MINOXIDIL 2.5 MG PO TABS: ORAL_TABLET | ORAL | 2 refills | Status: DC

## 2021-10-16 MED ORDER — FLUOCINONIDE 0.05 % EX SOLN: CUTANEOUS | 2 refills | Status: DC

## 2021-10-16 MED ORDER — KETOCONAZOLE 2 % EX SHAM
MEDICATED_SHAMPOO | CUTANEOUS | 5 refills | Status: AC
Start: 2021-10-16 — End: ?

## 2021-10-16 NOTE — Patient Instructions (Addendum)
Hair Loss: Start Minoxidil 2.5 mg 1/2 tablet once daily. Prescription will say to take 1 tablet daily. ONLY take 1/2 tablet daily  Doses of minoxidil for hair loss are considered 'low dose'. This is because the doses used for hair loss are a lot lower than the doses which are used for conditions such as high blood pressure (hypertension). The doses used for hypertension are 10-'40mg'$  per day.  Side effects are uncommon at the low doses (up to 2.5 mg/day) used to treat hair loss. Potential side effects, more commonly seen at higher doses, include: Increase in hair growth (hypertrichosis) elsewhere on face and body Temporary hair shedding upon starting medication which may last up to 4 weeks Ankle swelling, fluid retention, rapid weight gain more than 5 pounds Low blood pressure and feeling lightheaded or dizzy when standing up quickly Fast or irregular heartbeat Headaches  Folliculitis/Rash on scalp:  Start Ketoconazole 2% shampoo 1-2 times a week lather on scalp, leave on 8-10 minutes  Clindamycin solution 1-2 times daily to bumps in scalp a needed   Due to recent changes in healthcare laws, you may see results of your pathology and/or laboratory studies on MyChart before the doctors have had a chance to review them. We understand that in some cases there may be results that are confusing or concerning to you. Please understand that not all results are received at the same time and often the doctors may need to interpret multiple results in order to provide you with the best plan of care or course of treatment. Therefore, we ask that you please give [place] 2 business days to thoroughly review all your results before contacting the office for clarification. Should we see a critical lab result, you will be contacted sooner.   If You Need Anything After Your Visit  If you have any questions or concerns for your doctor, please call our main line at [phone] and press option 4 to reach your doctor's  medical assistant. If no one answers, please leave a voicemail as directed and we will return your call as soon as possible. Messages left after 4 pm will be answered the following business day.   You may also send [place] a message via [place]. We typically respond to MyChart messages within 1-2 business days.  For prescription refills, please ask your pharmacy to contact our office. Our fax number is [phone].  If you have an urgent issue when the clinic is closed that cannot wait until the next business day, you can page your doctor at the number below.    Please note that while we do our best to be available for urgent issues outside of office hours, we are not available 24/7.   If you have an urgent issue and are unable to reach [place], you may choose to seek medical care at your doctor's office, retail clinic, urgent care center, or emergency room.  If you have a medical emergency, please immediately call 911 or go to the emergency department.  Pager Numbers  - Dr. Nehemiah Massed: [phone]  - Dr. Laurence Ferrari: [phone]  - Dr. Nicole Kindred: [phone]  In the event of inclement weather, please call our main line at [phone] for an update on the status of any delays or closures.  Dermatology Medication Tips: Please keep the boxes that topical medications come in in order to help keep track of the instructions about where and how to use these. Pharmacies typically print the medication instructions only on the boxes and not directly on  the medication tubes.   If your medication is too expensive, please contact our office at [phone] option 4 or send [place] a message through [place].   We are unable to tell what your co-pay for medications will be in advance as this is different depending on your insurance coverage. However, we may be able to find a substitute medication at lower cost or fill out paperwork to get insurance to cover a needed medication.   If a prior authorization is required to  get your medication covered by your insurance company, please allow [place] 1-2 business days to complete this process.  Drug prices often vary depending on where the prescription is filled and some pharmacies may offer cheaper prices.  The website www.goodrx.com contains coupons for medications through different pharmacies. The prices here do not account for what the cost may be with help from insurance (it may be cheaper with your insurance), but the website can give you the price if you did not use any insurance.  - You can print the associated coupon and take it with your prescription to the pharmacy.  - You may also stop by our office during regular business hours and pick up a GoodRx coupon card.  - If you need your prescription sent electronically to a different pharmacy, notify our office through [facility] or by phone at [phone] option 4.     Si Usted Necesita Algo Despus de Su Visita  Tambin puede enviarnos un mensaje a travs de Pharmacist, community. Por lo general respondemos a los mensajes de MyChart en el transcurso de 1 a 2 das hbiles.  Para renovar recetas, por favor pida a su farmacia que se ponga en contacto con nuestra oficina. Harland Dingwall de fax es [place] [phone].  Si tiene un asunto urgente cuando la clnica est cerrada y que no puede esperar hasta el siguiente da hbil, puede llamar/localizar a su doctor(a) al nmero que aparece a continuacin.   Por favor, tenga en cuenta que aunque hacemos todo lo posible para estar disponibles para asuntos urgentes fuera del horario de [place], no estamos disponibles las 24 horas del da, los 7 das de la [place].   Si tiene un problema urgente y no puede comunicarse con nosotros, puede optar por buscar atencin mdica  en el consultorio de su doctor(a), en una clnica privada, en un centro de atencin urgente o en una sala de emergencias.  Si tiene Engineering geologist, por favor llame inmediatamente al 911 o vaya a la sala de  emergencias.  Nmeros de bper  - Dr. Nehemiah Massed: [phone]  - Dra. Moye: [phone]  - Dra. Nicole Kindred: [phone]  En caso de inclemencias del [place], por favor llame a Johnsie Kindred principal al [phone] para una actualizacin sobre el [place] de cualquier retraso o cierre.  Consejos para la medicacin en dermatologa: Por favor, guarde las cajas en las que vienen los medicamentos de uso tpico para ayudarle a seguir las instrucciones sobre dnde y cmo usarlos. Las farmacias generalmente imprimen las instrucciones del medicamento slo en las cajas y no directamente en los tubos del [place].   Si su medicamento es muy caro, por favor, pngase en contacto con Zigmund Daniel llamando al [phone] y presione la opcin 4 o envenos un mensaje a travs de Pharmacist, community.   No podemos decirle cul ser su copago por los medicamentos por adelantado ya que esto es diferente dependiendo de la cobertura de su seguro. Sin embargo, es posible que podamos encontrar un medicamento sustituto a  menor costo o llenar un formulario para que el seguro cubra el medicamento que se considera necesario.   Si se requiere una autorizacin previa para que su compaa de seguros [place] su medicamento, por favor permtanos de 1 a 2 das hbiles para completar este proceso.  Los precios de los medicamentos varan con frecuencia dependiendo del Environmental consultant de dnde se surte la receta y alguna farmacias pueden ofrecer precios ms baratos.  El sitio web www.goodrx.com tiene cupones para medicamentos de Airline pilot. Los precios aqu no tienen en cuenta lo que podra costar con la ayuda del seguro (puede ser ms barato con su seguro), pero el sitio web puede darle el precio si no utiliz Research scientist (physical sciences).  - Puede imprimir el cupn correspondiente y llevarlo con su receta a la farmacia.  - Tambin puede pasar por nuestra oficina durante el horario de atencin regular y Charity fundraiser una tarjeta de cupones de GoodRx.  - Si  necesita que su receta se enve electrnicamente a una farmacia diferente, informe a nuestra oficina a travs de MyChart de [place] o por telfono llamando al [phone] y presione la opcin 4.

## 2021-10-16 NOTE — Progress Notes (Signed)
Follow-Up Visit   Subjective  Brandy Guerrero is a 71 y.o. female who presents for the following: Acne (Face. Has taken Doxycycline in the past and used Clindamycin solution. Out of medication) and Alopecia (C/O alopecia on scalp. Has not been given any Rx Tx. No Hx of ILK injections. Recently had sore areas, similar to boils in scalp. Would like to discuss [place]. No family Hx of hair thinning. +hx of breast cancer).  She has not has any perms in years, and hair continues to gradually get thinner.  She wears a wig.    The following portions of the chart were reviewed this encounter and updated as appropriate:      Review of Systems: No other skin or systemic complaints except as noted in HPI or Assessment and Plan.   Objective  Well appearing patient in no apparent distress; mood and affect are within normal limits.  A focused examination was performed including head, including the scalp, face, neck, nose, ears, eyelids, and lips. Relevant physical exam findings are noted in the Assessment and Plan.  Scalp Diffuse thinning of the crown and BL temporal scalp with widening of the midline part with retention of the frontal hairline - Reviewed progressive nature and prognosis.            Scalp Focal areas of greasy scale. 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  In women androgenetic alopecia is commonly associated with menopause but may occur any time after puberty.  It causes hair thinning primarily on the crown with widening of the part and temporal hairline recession.  Can use OTC Rogaine (minoxidil) 5% solution/foam as directed.  Oral treatments in female patients who have no contraindication may include : - Low dose oral minoxidil 1.25 - '5mg'$  daily - Spironolactone 50 - '100mg'$  bid - Finasteride 2.5 - 5 mg  daily Adjunctive therapies include: - Low Level Laser Light Therapy (LLLT) - Platelet-rich plasma injections (PRP) - Hair Transplants or scalp reduction    +hx of recent breast cancer, treated and cleared- will not prescribe Finasteride Previous labs reviewed, nl Hb, nl TSH Advised to avoid traction, tight braids, etc with hair styling Continue to avoid perms/heat  BP 131/76  Start Minoxidil 2.5 mg 1/2 tablet once daily. Prescription will say to take 1 tablet daily. ONLY take 1/2 tablet daily to start  Doses of minoxidil for hair loss are considered 'low dose'. This is because the doses used for hair loss are a lot lower than the doses which are used for conditions such as high blood pressure (hypertension). The doses used for hypertension are 10-'40mg'$  per day.  Side effects are uncommon at the low doses (up to 2.5 mg/day) used to treat hair loss. Potential side effects, more commonly seen at higher doses, include: Increase in hair growth (hypertrichosis) elsewhere on face and body Temporary hair shedding upon starting medication which may last up to 4 weeks Ankle swelling, fluid retention, rapid weight gain more than 5 pounds Low blood pressure and feeling lightheaded or dizzy when standing up quickly Fast or irregular heartbeat Headaches   minoxidil (LONITEN) 2.5 MG tablet - Scalp Take 1 tablet once daily  Seborrheic dermatitis Scalp  With probable scalp folliculitis  Start Ketoconazole 2% shampoo 1 times a week lather on scalp, leave on 8-10 minutes  Clindamycin solution 1-2 times daily to  bumps in scalp as needed  Lidex scalp solution qd/bid to itchy bumps prn  Seborrheic Dermatitis  -  is a chronic persistent rash characterized by pinkness and scaling most commonly of the mid face but also can occur on the scalp (dandruff), ears; mid chest, mid back and groin.  It tends to be exacerbated by stress and cooler weather.  People who have neurologic disease may experience new  onset or exacerbation of existing seborrheic dermatitis.  The condition is not curable but treatable and can be controlled.   ketoconazole (NIZORAL) 2 % shampoo - Scalp 1-2 times per week lather on scalp, leave on 8-10 minutes, rinse well  clindamycin (CLEOCIN T) 1 % external solution - Scalp Apply 1-2 times daily to scalp as needed for breakouts  fluocinonide (LIDEX) 0.05 % external solution - Scalp Apply 1-2 drops once or twice daily to affected areas on scalp as needed for itching   Return in about 2 months (around 12/16/2021) for Alopecia Follow Up, Seborrheic Dermatitis Follow Up.  I, Emelia Salisbury, CMA, am acting as scribe for Brendolyn Patty, MD.  Documentation: I have reviewed the above documentation for accuracy and completeness, and I agree with the above.  Brendolyn Patty MD

## 2021-10-30 ENCOUNTER — Ambulatory Visit (INDEPENDENT_AMBULATORY_CARE_PROVIDER_SITE_OTHER): Payer: Medicare HMO | Admitting: Obstetrics and Gynecology

## 2021-10-30 ENCOUNTER — Encounter: Payer: Self-pay | Admitting: Obstetrics and Gynecology

## 2021-10-30 VITALS — BP 112/71 | HR 77 | Ht 62.0 in | Wt 191.6 lb

## 2021-10-30 DIAGNOSIS — R1900 Intra-abdominal and pelvic swelling, mass and lump, unspecified site: Secondary | ICD-10-CM

## 2021-10-30 DIAGNOSIS — Z01419 Encounter for gynecological examination (general) (routine) without abnormal findings: Secondary | ICD-10-CM

## 2021-10-30 NOTE — Progress Notes (Signed)
Patients presents for annual exam today. She states doing well today. History of hysterectomy in 1990's due to fibroids. Patient is up to date with mammogram, being followed due to history of breast cancer. She states no other questions or concerns at this time.

## 2021-10-30 NOTE — Progress Notes (Signed)
HPI:      [name] is a 71 y.o. G2P2 who LMP was No LMP recorded. Patient has had a hysterectomy.  Subjective:   She presents today for her annual examination.  Since her last visit she was diagnosed with breast cancer.  She has undergone surgery and radiation and is currently on letrozole.  She reports that she is doing very well and has no issues. She has a history of a solid pelvic mass which she states has caused her no problems.  She reports that her other OB/GYN many years ago wanted to remove it at the same time of her ventral hernia surgery.  She is not sure if she wants either surgery at this time as she is completely asymptomatic.    Hx: The following portions of the patient's history were reviewed and updated as appropriate:             She  has a past medical history of Acne, Anemia, Breast cancer ([place]), Bursitis of right shoulder, History of atrial fibrillation (05/11/2005), HLD (hyperlipidemia), Obesity, Panic attacks, and Positive occult stool blood test (02/11/2012). She does not have any pertinent problems on file. She  has a past surgical history that includes Colonoscopy (01/2012); Breast biopsy; Abdominal hysterectomy (1990s); and Breast lumpectomy with radioactive seed and sentinel lymph node biopsy (Left, 09/26/2020). Her family history includes Alcohol abuse in her brother and father; Diabetes in her daughter; Hypertension in her mother. She  reports that she has never smoked. She has never used smokeless tobacco. She reports current alcohol use. She reports that she does not use drugs. She has a current medication list which includes the following prescription(s): atorvastatin, vitamin d3, clindamycin, ketoconazole, latanoprost, letrozole, metformin, metoprolol tartrate, minoxidil, vitamin e, and fluocinonide. She is allergic to sulfa antibiotics.       Review of Systems:  Review of Systems  Constitutional: Denied constitutional symptoms, night sweats, recent  illness, fatigue, fever, insomnia and weight loss.  Eyes: Denied eye symptoms, eye pain, photophobia, vision change and visual disturbance.  Ears/Nose/Throat/Neck: Denied ear, nose, throat or neck symptoms, hearing loss, nasal discharge, sinus congestion and sore throat.  Cardiovascular: Denied cardiovascular symptoms, arrhythmia, chest pain/pressure, edema, exercise intolerance, orthopnea and palpitations.  Respiratory: Denied pulmonary symptoms, asthma, pleuritic pain, productive sputum, cough, dyspnea and wheezing.  Gastrointestinal: Denied, gastro-esophageal reflux, melena, nausea and vomiting.  Genitourinary: See HPI for additional information.  Musculoskeletal: Denied musculoskeletal symptoms, stiffness, swelling, muscle weakness and myalgia.  Dermatologic: Denied dermatology symptoms, rash and scar.  Neurologic: Denied neurology symptoms, dizziness, headache, neck pain and syncope.  Psychiatric: Denied psychiatric symptoms, anxiety and depression.  Endocrine: Denied endocrine symptoms including hot flashes and night sweats.   Meds:   Current Outpatient Medications on File Prior to Visit  Medication Sig Dispense Refill   atorvastatin (LIPITOR) 20 MG tablet Take 1 tablet (20 mg total) by mouth daily. 90 tablet 3   Cholecalciferol (VITAMIN D3) 25 MCG (1000 UT) CAPS Take 2 capsules (2,000 Units total) by mouth daily. 30 capsule    clindamycin (CLEOCIN T) 1 % external solution Apply 1-2 times daily to scalp as needed for breakouts 60 mL 5   ketoconazole (NIZORAL) 2 % shampoo 1-2 times per week lather on scalp, leave on 8-10 minutes, rinse well 120 mL 5   latanoprost (XALATAN) 0.005 % ophthalmic solution Place 1 drop into the left eye at bedtime.     letrozole (FEMARA) 2.5 MG tablet Take 1 tablet (2.5 mg total) by  mouth daily. 90 tablet 3   metFORMIN (GLUCOPHAGE) 500 MG tablet Take 1 tablet (500 mg total) by mouth in the morning, at noon, and at bedtime. 270 tablet 3   metoprolol tartrate  (LOPRESSOR) 25 MG tablet Take 1 tablet (25 mg total) by mouth 2 (two) times daily as needed (heart fluttering). 30 tablet 1   minoxidil (LONITEN) 2.5 MG tablet Take 1 tablet once daily 30 tablet 2   vitamin E 180 MG (400 UNITS) capsule Take 400 Units by mouth daily. Take two by mouth daily     fluocinonide (LIDEX) 0.05 % external solution Apply 1-2 drops once or twice daily to affected areas on scalp as needed for itching 60 mL 2   No current facility-administered medications on file prior to visit.     Objective:     Vitals:   10/30/21 1115  BP: 112/71  Pulse: 77    Filed Weights   10/30/21 1115  Weight: 191 lb 9.6 oz (86.9 kg)              Physical examination   Pelvic:   Vulva: Normal appearance.  No lesions.  Vagina: No lesions or abnormalities noted.  Support: Normal pelvic support.  Urethra No masses tenderness or scarring.  Meatus Normal size without lesions or prolapse.  Cervix: Surgically absent  Anus: Normal exam.  No lesions.  Perineum: Normal exam.  No lesions.        Bimanual   Uterus: Surgically absent  Adnexae: Midline mass palpated-nontender  Cul-de-sac: Negative for abnormality.     Assessment:    G2P2 Patient Active Problem List   Diagnosis Date Noted   Malignant neoplasm of upper-outer quadrant of left breast in female, estrogen receptor positive ([place]) 08/31/2020   Medicare annual wellness visit, subsequent 04/24/2020   Insomnia 09/24/2018   Advanced care planning/counseling discussion 10/28/2017   Atypical ductal hyperplasia of left breast 06/17/2016   Persistent cough for 3 weeks or longer 05/29/2015   Health maintenance examination 10/21/2011   Controlled type 2 diabetes mellitus without complication, without long-term current use of insulin ([employer]) 10/21/2011   Palpitations 02/77/4128   Umbilical hernia 78/67/6720   Obesity, Class I, BMI 30-34.9 09/17/2011   Panic attacks    Hyperlipidemia associated with type 2 diabetes mellitus ([place])     Acne    Pelvic mass in female      1. Well woman exam with routine gynecological exam   2. Pelvic mass in female     She has a solid pelvic mass which has been stable for at least 7 years.  2 prior CT scans showing no change over the course of 7 years.  She has not had it evaluated in more than 3 years but is not currently having any symptoms.   Plan:            1.  Ultrasound to reevaluate pelvic mass.  Nothing further to do if patient remains asymptomatic and does not want surgery and the mass shows no change as it is most likely.  2.  She has previously considered ventral hernia surgery and I have advised her that if she does have the surgery consideration should be given to removal of her pelvic mass. Orders Orders Placed This Encounter  Procedures   US PELVIS (TRANSABDOMINAL ONLY)   US PELVIS TRANSVAGINAL NON-OB (TV ONLY)    No orders of the defined types were placed in this encounter.         F/U  Return for We will contact her with any abnormal test results.  Finis Bud, M.D. 10/30/2021 11:52 AM

## 2021-11-06 ENCOUNTER — Other Ambulatory Visit: Payer: Medicare HMO

## 2021-11-20 ENCOUNTER — Other Ambulatory Visit: Payer: Medicare HMO

## 2021-11-27 ENCOUNTER — Ambulatory Visit
Admission: RE | Admit: 2021-11-27 | Discharge: 2021-11-27 | Disposition: A | Discharge status: Home / Self Care | Payer: Medicare HMO | Source: Ambulatory Visit | Attending: Radiation Oncology | Admitting: Radiation Oncology

## 2021-11-27 ENCOUNTER — Encounter: Payer: Self-pay | Admitting: Radiation Oncology

## 2021-11-27 VITALS — BP 137/86 | HR 80 | Resp 18 | Wt 189.0 lb

## 2021-11-27 DIAGNOSIS — Z170 Estrogen receptor positive status [ER+]: Secondary | ICD-10-CM | POA: Insufficient documentation

## 2021-11-27 DIAGNOSIS — Z79811 Long term (current) use of aromatase inhibitors: Secondary | ICD-10-CM | POA: Diagnosis not present

## 2021-11-27 DIAGNOSIS — Z923 Personal history of irradiation: Secondary | ICD-10-CM | POA: Insufficient documentation

## 2021-11-27 DIAGNOSIS — C50412 Malignant neoplasm of upper-outer quadrant of left female breast: Secondary | ICD-10-CM | POA: Diagnosis not present

## 2021-11-27 NOTE — Progress Notes (Signed)
Radiation Oncology Follow up Note  Name: Brandy Guerrero   Date:   11/27/2021 MRN:  [id number] DOB: [birth year]    This 71 y.o. 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  Seen today in routine follow-up she is doing well.  She specifically denies breast tenderness cough or bone pain..  Mammograms which I have reviewed back in August were BI-RADS 2 benign.  She is currently on Femara tolerating it well without side effect.  COMPLICATIONS OF TREATMENT: none  FOLLOW UP COMPLIANCE: keeps appointments   PHYSICAL EXAM:  BP 137/86 (BP Location: Right Arm, Patient Position: Sitting)   Pulse 80   Resp 18   Ht (P) '5\' 2"'$  (1.575 m)   Wt 189 lb (85.7 kg)   BMI (P) 34.57 kg/m  Lungs are clear to A&P cardiac examination essentially unremarkable with regular rate and rhythm. No dominant mass or nodularity is noted in either breast in 2 positions examined. Incision is well-healed. No axillary or supraclavicular adenopathy is appreciated. Cosmetic result is excellent.  Well-developed well-nourished patient in NAD. HEENT reveals PERLA, EOMI, discs not visualized.  Oral cavity is clear. No oral mucosal lesions are identified. Neck is clear without evidence of cervical or supraclavicular adenopathy. Lungs are clear to A&P. Cardiac examination is essentially unremarkable with regular rate and rhythm without murmur rub or thrill. Abdomen is benign with no organomegaly or masses noted. Motor sensory and DTR levels are equal and symmetric in the upper and lower extremities. Cranial nerves II through XII are grossly intact. Proprioception is intact. No peripheral adenopathy or edema is  identified. No motor or sensory levels are noted. Crude visual fields are within normal range.  RADIOLOGY RESULTS: Mammograms reviewed compatible with above-stated findings  PLAN: The present time patient is doing well with no evidence of disease now out 1 year.  She continues on Femara without side effect.  I have asked to see her back in 1 year for follow-up.  Patient is to call with any concerns at any time.  I would like to take this opportunity to thank you for allowing me to participate in the care of your patient.Noreene Filbert, MD

## 2021-12-04 ENCOUNTER — Ambulatory Visit (INDEPENDENT_AMBULATORY_CARE_PROVIDER_SITE_OTHER): Payer: Medicare HMO | Admitting: Family Medicine

## 2021-12-04 ENCOUNTER — Encounter: Payer: Self-pay | Admitting: Family Medicine

## 2021-12-04 VITALS — BP 134/72 | HR 65 | Temp 97.8°F | Ht 62.0 in | Wt 189.4 lb

## 2021-12-04 DIAGNOSIS — Z170 Estrogen receptor positive status [ER+]: Secondary | ICD-10-CM | POA: Diagnosis not present

## 2021-12-04 DIAGNOSIS — C50412 Malignant neoplasm of upper-outer quadrant of left female breast: Secondary | ICD-10-CM

## 2021-12-04 DIAGNOSIS — E1169 Type 2 diabetes mellitus with other specified complication: Secondary | ICD-10-CM

## 2021-12-04 LAB — POCT GLYCOSYLATED HEMOGLOBIN (HGB A1C): Hemoglobin A1C: 7.5 % — AB (ref 4.0–5.6)

## 2021-12-04 MED ORDER — EMPAGLIFLOZIN 10 MG PO TABS: 10.0000 mg | ORAL_TABLET | Freq: Every day | ORAL | 6 refills | Status: DC

## 2021-12-04 NOTE — Assessment & Plan Note (Addendum)
Chronic, improved control on current metformin regimen however still above ideal. She is interested in trial jardiance - reviewed mechanism of action as well as possible side effects to watch for. Jardiance '10mg'$  sent to pharmacy for patient to price out, she will also check on cost for Farxiga.

## 2021-12-04 NOTE — Progress Notes (Signed)
Patient ID: Brandy Guerrero, female    DOB: [birth year], 71 y.o.   MRN: [id number]  This visit was conducted in person.  BP 134/72   Pulse 65   Temp 97.8 F (36.6 C) (Temporal)   Ht '5\' 2"'$  (1.575 m)   Wt 189 lb 6 oz (85.9 kg)   SpO2 97%   BMI 34.64 kg/m    CC: 6 mo DM f/u visit  Subjective:   HPI: Brandy Guerrero is a 71 y.o. female presenting on 12/04/2021 for Diabetes (Here for 6 mo f/u.)   L stage Ia breast cancer 07/2020 (ER/PR positive) s/p wide local excision Brandy Guerrero) and SNL biopsy s/p adjuvant whole breast radiation - saw onc Brandy Guerrero and Brandy Guerrero) and rad/onc (Brandy Guerrero). Now on letrozole (Femara) anti-estrogen aromatase inhibitor planned for 5 yrs.   Well woman - established with Dr Amalia Hailey GYN. Planned rpt pelvic US to ensure stability of known pelvic mass.   Seeing derm for h/o alopecia - treating with oral minoxidil 2.'5mg'$  1/2 tab.   DM - does not regularly check sugars. Compliant with antihyperglycemic regimen which includes: metformin '500mg'$  TID, occ misses 3rd dose. 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 Asks about jardiance. Denies low sugars or hypoglycemic symptoms. Denies paresthesias, blurry vision. Last diabetic eye exam 08/2021 ([facility]). Glucometer brand: doesn't have one. Last foot exam: 07/2019 - DUE. DSME: declines. Lab Results  Component Value Date   HGBA1C 7.5 (A) 12/04/2021   Diabetic Foot Exam - Simple   Simple Foot Form Diabetic Foot exam was performed with the following findings: Yes 12/04/2021 10:58 AM  Visual Inspection No deformities, no ulcerations, no other skin breakdown bilaterally: Yes Sensation Testing Intact to touch and monofilament testing bilaterally: Yes Pulse Check Posterior Tibialis and Dorsalis pulse intact bilaterally: Yes Comments    Lab Results  Component Value Date   MICROALBUR 0.8 05/22/2021         Relevant past medical, surgical, family and social history reviewed and updated as indicated. Interim medical history  since our last visit reviewed. Allergies and medications reviewed and updated. Outpatient Medications Prior to Visit  Medication Sig Dispense Refill   atorvastatin (LIPITOR) 20 MG tablet Take 1 tablet (20 mg total) by mouth daily. 90 tablet 3   Cholecalciferol (VITAMIN D3) 25 MCG (1000 UT) CAPS Take 2 capsules (2,000 Units total) by mouth daily. 30 capsule    clindamycin (CLEOCIN T) 1 % external solution Apply 1-2 times daily to scalp as needed for breakouts 60 mL 5   fluocinonide (LIDEX) 0.05 % external solution Apply 1-2 drops once or twice daily to affected areas on scalp as needed for itching 60 mL 2   ketoconazole (NIZORAL) 2 % shampoo 1-2 times per week lather on scalp, leave on 8-10 minutes, rinse well 120 mL 5   latanoprost (XALATAN) 0.005 % ophthalmic solution Place 1 drop into the left eye at bedtime.     letrozole (FEMARA) 2.5 MG tablet Take 1 tablet (2.5 mg total) by mouth daily. 90 tablet 3   metFORMIN (GLUCOPHAGE) 500 MG tablet Take 1 tablet (500 mg total) by mouth in the morning, at noon, and at bedtime. 270 tablet 3   metoprolol tartrate (LOPRESSOR) 25 MG tablet Take 1 tablet (25 mg total) by mouth 2 (two) times daily as needed (heart fluttering). 30 tablet 1   minoxidil (LONITEN) 2.5 MG tablet Take 1 tablet once daily 30 tablet 2   vitamin E 180 MG (400 UNITS) capsule Take 400  Units by mouth daily. Take two by mouth daily     No facility-administered medications prior to visit.     Per HPI unless specifically indicated in ROS section below Review of Systems  Objective:  BP 134/72   Pulse 65   Temp 97.8 F (36.6 C) (Temporal)   Ht '5\' 2"'$  (1.575 m)   Wt 189 lb 6 oz (85.9 kg)   SpO2 97%   BMI 34.64 kg/m   Wt Readings from Last 3 Encounters:  12/04/21 189 lb 6 oz (85.9 kg)  11/27/21 189 lb (85.7 kg)  10/30/21 191 lb 9.6 oz (86.9 kg)      Physical Exam Vitals and nursing note reviewed.  Constitutional:      Appearance: Normal appearance. She is not ill-appearing.   HENT:     Mouth/Throat:     Mouth: Mucous membranes are moist.     Pharynx: Oropharynx is clear. No oropharyngeal exudate or posterior oropharyngeal erythema.  Eyes:     Extraocular Movements: Extraocular movements intact.     Conjunctiva/sclera: Conjunctivae normal.     Pupils: Pupils are equal, round, and reactive to light.  Cardiovascular:     Rate and Rhythm: Normal rate and regular rhythm.     Pulses: Normal pulses.     Heart sounds: Normal heart sounds. No murmur heard. Pulmonary:     Effort: Pulmonary effort is normal. No respiratory distress.     Breath sounds: Normal breath sounds. No wheezing, rhonchi or rales.  Musculoskeletal:     Right lower leg: No edema.     Left lower leg: No edema.  Skin:    General: Skin is warm and dry.     Findings: No rash.  Neurological:     Mental Status: She is alert.  Psychiatric:        Mood and Affect: Mood normal.        Behavior: Behavior normal.       Results for orders placed or performed in visit on 12/04/21  POCT glycosylated hemoglobin (Hb A1C)  Result Value Ref Range   Hemoglobin A1C 7.5 (A) 4.0 - 5.6 %   HbA1c POC (<> result, manual entry)     HbA1c, POC (prediabetic range)     HbA1c, POC (controlled diabetic range)      Assessment & Plan:   Problem List Items Addressed This Visit     Type 2 diabetes mellitus with other specified complication ([place]) - Primary    Chronic, improved control on current metformin regimen however still above ideal. She is interested in trial jardiance - reviewed mechanism of action as well as possible side effects to watch for. Jardiance '10mg'$  sent to pharmacy for patient to price out, she will also check on cost for Farxiga.       Relevant Medications   empagliflozin (JARDIANCE) 10 MG TABS tablet   Other Relevant Orders   POCT glycosylated hemoglobin (Hb A1C) (Completed)   Malignant neoplasm of upper-outer quadrant of left breast in female, estrogen receptor positive ([place])    She  continues Femara Appreciate onc/radonc/gen surgery care.         Meds ordered this encounter  Medications   empagliflozin (JARDIANCE) 10 MG TABS tablet    Sig: Take 1 tablet (10 mg total) by mouth daily before breakfast.    Dispense:  30 tablet    Refill:  6   Orders Placed This Encounter  Procedures   POCT glycosylated hemoglobin (Hb A1C)     Patient  Instructions  Price out jardiance (empagliflozin) '10mg'$  daily to take in the morning. If unaffordable, ask insurance about farxiga (dapagliflozin).  You are doing well today Continue metformin 3 tablets daily - if remembered better, may take 1 with breakfast and 2 with dinner.  Return as needed or in 6 months for physical.   Follow up plan: Return in about 6 months (around 06/05/2022), or if symptoms worsen or fail to improve, for annual exam, prior fasting for blood work, medicare wellness visit.  Brandy Bush, MD

## 2021-12-04 NOTE — Patient Instructions (Addendum)
Price out jardiance (empagliflozin) '10mg'$  daily to take in the morning. If unaffordable, ask insurance about farxiga (dapagliflozin).  You are doing well today Continue metformin 3 tablets daily - if remembered better, may take 1 with breakfast and 2 with dinner.  Return as needed or in 6 months for physical.

## 2021-12-04 NOTE — Assessment & Plan Note (Addendum)
She continues Femara Appreciate onc/radonc/gen surgery care.

## 2021-12-11 ENCOUNTER — Other Ambulatory Visit: Payer: Medicare HMO

## 2021-12-17 ENCOUNTER — Ambulatory Visit: Payer: Medicare HMO | Admitting: Dermatology

## 2021-12-17 DIAGNOSIS — L649 Androgenic alopecia, unspecified: Secondary | ICD-10-CM | POA: Diagnosis not present

## 2021-12-17 DIAGNOSIS — L219 Seborrheic dermatitis, unspecified: Secondary | ICD-10-CM

## 2021-12-17 DIAGNOSIS — Z853 Personal history of malignant neoplasm of breast: Secondary | ICD-10-CM

## 2021-12-17 DIAGNOSIS — L659 Nonscarring hair loss, unspecified: Secondary | ICD-10-CM | POA: Diagnosis not present

## 2021-12-17 MED ORDER — FLUOCINONIDE 0.05 % EX SOLN
CUTANEOUS | 2 refills | Status: AC
Start: 2021-12-17 — End: ?

## 2021-12-17 MED ORDER — MINOXIDIL 2.5 MG PO TABS: ORAL_TABLET | ORAL | 2 refills | Status: DC

## 2021-12-17 NOTE — Progress Notes (Signed)
Follow-Up Visit   Subjective  Brandy Guerrero is a 71 y.o. female who presents for the following: Follow-up (2 month alopecia, patient is currently taking minoxidil 2.5 mg 1/2 tab qd. Patient denies any side effects. Also follow up on seb derm, patient using ketoconazole shampoo every 2 weeks, lidex solution as needed for itch at scalp, and clindamycin solution 1 - 2 times daily to bumps prn. ).  Pt doing well.   The following portions of the chart were reviewed this encounter and updated as appropriate:      Review of Systems: No other skin or systemic complaints except as noted in HPI or Assessment and Plan.   Objective  Well appearing patient in no apparent distress; mood and affect are within normal limits.  A focused examination was performed including scalp. Relevant physical exam findings are noted in the Assessment and Plan.  Scalp Round, patchy areas of nonscarring hair loss.  Significant improvement noted when compared to baseline photos with hair regrowth on temporal scalp and crown.   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 Condition is symptomatic/ bothersome to patient. Not currently at goal.  Improving when compared to prior photos.    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  In women androgenetic alopecia is commonly associated with menopause but may occur any time after puberty.  It causes hair thinning primarily on the crown with widening of the part and temporal hairline recession.  Can use OTC Rogaine (minoxidil) 5% solution/foam as directed.  Oral treatments in female patients who have no contraindication may include : - Low dose oral minoxidil 1.25 - '5mg'$  daily - Spironolactone 50 - '100mg'$  bid - Finasteride 2.5 - 5 mg daily Adjunctive therapies include: - Low Level Laser Light  Therapy (LLLT) - Platelet-rich plasma injections (PRP) - Hair Transplants or scalp reduction     +hx of recent breast cancer, treated and cleared- will not prescribe Finasteride Previous labs reviewed, nl Hb, nl TSH Advised to avoid traction, tight braids, etc with hair styling Continue to avoid perms/heat   BP 118/66   Increase  Minoxidil 2.5 mg  1/2 tab to 1 tablet once daily.    Doses of minoxidil for hair loss are considered 'low dose'. This is because the doses used for hair loss are a lot lower than the doses which are used for conditions such as high blood pressure (hypertension). The doses used for hypertension are 10-'40mg'$  per day.  Side effects are uncommon at the low doses (up to 2.5 mg/day) used to treat hair loss. Potential side effects, more commonly seen at higher doses, include: Increase in hair growth (hypertrichosis) elsewhere on face and body Temporary hair shedding upon starting medication which may last up to 4 weeks Ankle swelling, fluid retention, rapid weight gain more than 5 pounds Low blood pressure and feeling lightheaded or dizzy when standing up quickly Fast or irregular heartbeat Headaches  Seborrheic dermatitis Scalp  With probable scalp folliculitis, Chronic condition with duration or expected duration over one year. Currently well-controlled.    Continue  Ketoconazole 2% shampoo 1 times a week lather on scalp, leave on 8-10 minutes   Continue Clindamycin solution 1-2 times daily to bumps in scalp as needed   Continue Lidex scalp solution qd/bid to itchy bumps prn   Seborrheic Dermatitis  -  is  a chronic persistent rash characterized by pinkness and scaling most commonly of the mid face but also can occur on the scalp (dandruff), ears; mid chest, mid back and groin.  It tends to be exacerbated by stress and cooler weather.  People who have neurologic disease may experience new onset or exacerbation of existing seborrheic dermatitis.  The condition is  not curable but treatable and can be controlled.      Related Medications ketoconazole (NIZORAL) 2 % shampoo 1-2 times per week lather on scalp, leave on 8-10 minutes, rinse well  clindamycin (CLEOCIN T) 1 % external solution Apply 1-2 times daily to scalp as needed for breakouts  fluocinonide (LIDEX) 0.05 % external solution Apply 1-2 drops once or twice daily to affected areas on scalp as needed for itching  Androgenic alopecia  Related Medications minoxidil (LONITEN) 2.5 MG tablet Take 1 tablet once daily   Return in 4 months (on 04/17/2022) for 4 month alopecia / seb derm follow up.  I, Ruthell Rummage, CMA, am acting as scribe for Brendolyn Patty, MD.  Documentation: I have reviewed the above documentation for accuracy and completeness, and I agree with the above.  Brendolyn Patty MD

## 2021-12-17 NOTE — Patient Instructions (Addendum)
Start minoxidil 2.5 mg tablet - take 1 whole tab daily  If having any side effects  Doses of minoxidil for hair loss are considered 'low dose'. This is because the doses used for hair loss are a lot lower than the doses which are used for conditions such as high blood pressure (hypertension). The doses used for hypertension are 10-'40mg'$  per day.  Side effects are uncommon at the low doses (up to 2.5 mg/day) used to treat hair loss. Potential side effects, more commonly seen at higher doses, include: Increase in hair growth (hypertrichosis) elsewhere on face and body Temporary hair shedding upon starting medication which may last up to 4 weeks Ankle swelling, fluid retention, rapid weight gain more than 5 pounds Low blood pressure and feeling lightheaded or dizzy when standing up quickly Fast or irregular heartbeat Headaches  Avoid tight braids or hair styles   Avoid hair relaxer's    For Seb Derm at scalp   Continue  Ketoconazole 2% shampoo 1 times a 1 or 2week lather on scalp, leave on 8-10 minutes   Continue Clindamycin solution 1-2 times daily to bumps in scalp as needed   Continue Lidex scalp solution daily or twice to itchy bumps prn      Due to recent changes in healthcare laws, you may see results of your pathology and/or laboratory studies on MyChart before the doctors have had a chance to review them. We understand that in some cases there may be results that are confusing or concerning to you. Please understand that not all results are received at the same time and often the doctors may need to interpret multiple results in order to provide you with the best plan of care or course of treatment. Therefore, we ask that you please give [place] 2 business days to thoroughly review all your results before contacting the office for clarification. Should we see a critical lab result, you will be contacted sooner.   If You Need Anything After Your Visit  If you have any questions or  concerns for your doctor, please call our main line at [phone] and press option 4 to reach your doctor's medical assistant. If no one answers, please leave a voicemail as directed and we will return your call as soon as possible. Messages left after 4 pm will be answered the following business day.   You may also send [place] a message via [place]. We typically respond to MyChart messages within 1-2 business days.  For prescription refills, please ask your pharmacy to contact our office. Our fax number is [phone].  If you have an urgent issue when the clinic is closed that cannot wait until the next business day, you can page your doctor at the number below.    Please note that while we do our best to be available for urgent issues outside of office hours, we are not available 24/7.   If you have an urgent issue and are unable to reach [place], you may choose to seek medical care at your doctor's office, retail clinic, urgent care center, or emergency room.  If you have a medical emergency, please immediately call 911 or go to the emergency department.  Pager Numbers  - Dr. Nehemiah Massed: [phone]  - Dr. Laurence Ferrari: [phone]  - Dr. Nicole Kindred: [phone]  In the event of inclement weather, please call our main line at [phone] for an update on the status of any delays or closures.  Dermatology Medication Tips: Please keep the boxes that topical medications  medications come in in order to help keep track of the instructions about where and how to use these. Pharmacies typically print the medication instructions only on the boxes and not directly on the medication tubes.   If your medication is too expensive, please contact our office at [phone] option 4 or send us a message through MyChart.   We are unable to tell what your co-pay for medications will be in advance as this is different depending on your insurance coverage. However, we may be able to find a substitute medication at lower cost  or fill out paperwork to get insurance to cover a needed medication.   If a prior authorization is required to get your medication covered by your insurance company, please allow us 1-2 business days to complete this process.  Drug prices often vary depending on where the prescription is filled and some pharmacies may offer cheaper prices.  The website www.goodrx.com contains coupons for medications through different pharmacies. The prices here do not account for what the cost may be with help from insurance (it may be cheaper with your insurance), but the website can give you the price if you did not use any insurance.  - You can print the associated coupon and take it with your prescription to the pharmacy.  - You may also stop by our office during regular business hours and pick up a GoodRx coupon card.  - If you need your prescription sent electronically to a different pharmacy, notify our office through [facility] MyChart or by phone at [phone] option 4.     Si Usted Necesita Algo Despus de Su Visita  Tambin puede enviarnos un mensaje a travs de MyChart. Por lo general respondemos a los mensajes de MyChart en el transcurso de 1 a 2 das hbiles.  Para renovar recetas, por favor pida a su farmacia que se ponga en contacto con nuestra oficina. Nuestro nmero de fax es el [phone].  Si tiene un asunto urgente cuando la clnica est cerrada y que no puede esperar hasta el siguiente da hbil, puede llamar/localizar a su doctor(a) al nmero que aparece a continuacin.   Por favor, tenga en cuenta que aunque hacemos todo lo posible para estar disponibles para asuntos urgentes fuera del horario de oficina, no estamos disponibles las 24 horas del da, los 7 das de la semana.   Si tiene un problema urgente y no puede comunicarse con nosotros, puede optar por buscar atencin mdica  en el consultorio de su doctor(a), en una clnica privada, en un centro de atencin urgente o en una  sala de emergencias.  Si tiene una emergencia mdica, por favor llame inmediatamente al 911 o vaya a la sala de emergencias.  Nmeros de bper  - Dr. Kowalski: [phone]  - Dra. Moye: [phone]  - Dra. Stewart: [phone]  En caso de inclemencias del tiempo, por favor llame a nuestra lnea principal al [phone] para una actualizacin sobre el estado de cualquier retraso o cierre.  Consejos para la medicacin en dermatologa: Por favor, guarde las cajas en las que vienen los medicamentos de uso tpico para ayudarle a seguir las instrucciones sobre dnde y cmo usarlos. Las farmacias generalmente imprimen las instrucciones del medicamento slo en las cajas y no directamente en los tubos del medicamento.   Si su medicamento es muy caro, por favor, pngase en contacto con nuestra oficina llamando al [phone] y presione la opcin 4 o envenos un mensaje a travs de MyChart.   No podemos   cul ser su copago por los medicamentos por adelantado ya que esto es diferente dependiendo de la cobertura de su seguro. Sin embargo, es posible que podamos encontrar un medicamento sustituto a Electrical engineer un formulario para que el seguro cubra el medicamento que se considera necesario.   Si se requiere una autorizacin previa para que su compaa de seguros [place] su medicamento, por favor permtanos de 1 a 2 das hbiles para completar este proceso.  Los precios de los medicamentos varan con frecuencia dependiendo del Environmental consultant de dnde se surte la receta y alguna farmacias pueden ofrecer precios ms baratos.  El sitio web www.goodrx.com tiene cupones para medicamentos de Airline pilot. Los precios aqu no tienen en cuenta lo que podra costar con la ayuda del seguro (puede ser ms barato con su seguro), pero el sitio web puede darle el precio si no utiliz Research scientist (physical sciences).  - Puede imprimir el cupn correspondiente y llevarlo con su receta a la farmacia.  - Tambin puede pasar  por nuestra oficina durante el horario de atencin regular y Charity fundraiser una tarjeta de cupones de GoodRx.  - Si necesita que su receta se enve electrnicamente a una farmacia diferente, informe a nuestra oficina a travs de MyChart de [place] o por telfono llamando al [phone] y presione la opcin 4.

## 2021-12-22 NOTE — Progress Notes (Incomplete)
Patient Care Team: Ria Bush, MD as PCP - General (Family Medicine) Jovita Kussmaul, MD as Consulting Physician (General Surgery) Nicholas Lose, MD as Consulting Physician (Hematology and Oncology) Noreene Filbert, MD as Consulting Physician (Radiation Oncology) Elsie Saas, MD as Consulting Physician (Orthopedic Surgery)  DIAGNOSIS: No diagnosis found.  SUMMARY OF ONCOLOGIC HISTORY: Oncology History  Atypical ductal hyperplasia of left breast  08/02/2020 Initial Diagnosis   Invasive ductal carcinoma of the left breast  Biopsy of left breast calcifications on 05/23/2016 showed atypical ductal hyperplasia. Diagnostic mammogram and [place] on 07/13/20 showed a 7 mm mass in the 10 o'clock position of the left breast. 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   Malignant neoplasm of upper-outer quadrant of left breast in female, estrogen receptor positive ([place])  08/02/2020 Initial Diagnosis   Screening mammogram detected left breast mass measuring 7 mm at 10 o'clock position.  Biopsy revealed grade 2 IDC with DCIS with calcifications, ER 90%, PR 100%, Ki67 10%, HER2 negative   09/26/2020 Surgery   Left lumpectomy: Grade 1 IDC 0.8 cm, with DCIS low-grade, 0/5 lymph nodes, ER 90%, PR 100%, HER2 negative, Ki-67 10%   10/30/2020 - 11/28/2020 Radiation Therapy   Adj XRT at [facility]   12/24/2020 -  Anti-estrogen oral therapy   Letrozole 2.5 mg daily   03/23/2021 Cancer Staging   Staging form: Breast, AJCC 8th Edition - Pathologic: Stage IA (pT1b, pN0, cM0, G1, ER+, PR+, HER2-) - Signed by Gardenia Phlegm, NP on 03/23/2021 Histologic grading system: 3 grade system     CHIEF COMPLIANT: history of invasive ductal carcinoma   INTERVAL HISTORY: Brandy Guerrero is a 71 y.o. with above-mentioned history of invasive ductal carcinoma of the left breast having undergone lumpectomy.     ALLERGIES:  is  allergic to sulfa antibiotics.  MEDICATIONS:  Current Outpatient Medications  Medication Sig Dispense Refill   atorvastatin (LIPITOR) 20 MG tablet Take 1 tablet (20 mg total) by mouth daily. 90 tablet 3   Cholecalciferol (VITAMIN D3) 25 MCG (1000 UT) CAPS Take 2 capsules (2,000 Units total) by mouth daily. 30 capsule    clindamycin (CLEOCIN T) 1 % external solution Apply 1-2 times daily to scalp as needed for breakouts 60 mL 5   empagliflozin (JARDIANCE) 10 MG TABS tablet Take 1 tablet (10 mg total) by mouth daily before breakfast. 30 tablet 6   fluocinonide (LIDEX) 0.05 % external solution Apply 1-2 drops once or twice daily to affected areas on scalp as needed for itching 60 mL 2   ketoconazole (NIZORAL) 2 % shampoo 1-2 times per week lather on scalp, leave on 8-10 minutes, rinse well 120 mL 5   latanoprost (XALATAN) 0.005 % ophthalmic solution Place 1 drop into the left eye at bedtime.     letrozole (FEMARA) 2.5 MG tablet Take 1 tablet (2.5 mg total) by mouth daily. 90 tablet 3   metFORMIN (GLUCOPHAGE) 500 MG tablet Take 1 tablet (500 mg total) by mouth in the morning, at noon, and at bedtime. 270 tablet 3   metoprolol tartrate (LOPRESSOR) 25 MG tablet Take 1 tablet (25 mg total) by mouth 2 (two) times daily as needed (heart fluttering). 30 tablet 1   minoxidil (LONITEN) 2.5 MG tablet Take 1 tablet once daily 30 tablet 2   vitamin E 180 MG (400 UNITS) capsule Take 400 Units by mouth daily. Take two by mouth daily     No current facility-administered  medications for this visit.    PHYSICAL EXAMINATION: ECOG PERFORMANCE STATUS: {CHL ONC ECOG [phone]}  There were no vitals filed for this visit. There were no vitals filed for this visit.  BREAST:*** No palpable masses or nodules in either right or left breasts. No palpable axillary supraclavicular or infraclavicular adenopathy no breast tenderness or nipple discharge. (exam performed in the presence of a chaperone)  LABORATORY DATA:   I have reviewed the data as listed    Latest Ref Rng & Units 05/22/2021   10:58 AM 12/05/2020    9:08 AM 09/21/2020   11:45 AM  CMP  Glucose 70 - 99 mg/dL 196  166  149   BUN 6 - 23 mg/dL _0 Creatinine 0.40 - 1.20 mg/dL 0.75  0.69  0.79   Sodium 135 - 145 mEq/L 139  139  140   Potassium 3.5 - 5.1 mEq/L 4.0  4.1  4.2   Chloride 96 - 112 mEq/L 103  103  104   CO2 19 - 32 mEq/L _1 Calcium 8.4 - 10.5 mg/dL 9.3  9.3  9.4   Total Protein 6.0 - 8.3 g/dL 6.9     Total Bilirubin 0.2 - 1.2 mg/dL 0.7     Alkaline Phos 39 - 117 U/L 140     AST 0 - 37 U/L 13     ALT 0 - 35 U/L 13       Lab Results  Component Value Date   WBC 5.6 05/22/2021   HGB 12.0 05/22/2021   HCT 36.1 05/22/2021   MCV 91.0 05/22/2021   PLT 254.0 05/22/2021   NEUTROABS 3.8 05/22/2021    ASSESSMENT & PLAN:  No problem-specific Assessment & Plan notes found for this encounter.    No orders of the defined types were placed in this encounter.  The patient has a good understanding of the overall plan. she agrees with it. she will call with any problems that may develop before the next visit here. Total time spent: 30 mins including face to face time and time spent for planning, charting and co-ordination of care   Suzzette Righter, [place] 12/22/21    sacroiliac

## 2021-12-26 ENCOUNTER — Ambulatory Visit: Payer: Medicare HMO | Admitting: Hematology and Oncology

## 2021-12-26 NOTE — Assessment & Plan Note (Deleted)
Diagnostic mammogram and [place] on 07/13/20 showed a 7 mm mass in the 10 o'clock position of the left breast. 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   09/26/2020:Left lumpectomy: Grade 1 IDC 0.8 cm, with DCIS low-grade, 0/5 lymph nodes, ER 90%, PR 100%, HER2 negative, Ki-67 10%   Treatment plan: 1.  adjuvant radiation at [facility] completed 11/28/20 2.  Followed by adjuvant antiestrogen therapy started 12/24/20  Letrozole toxicities:  Breast cancer surveillance: Breast exam 12/26/2021: Benign Mammogram 10/02/2021: Benign breast density category C Bone density 10/02/2021: T score +0.3: Normal  Return to clinic in 1 year for follow-up

## 2022-01-01 ENCOUNTER — Ambulatory Visit (INDEPENDENT_AMBULATORY_CARE_PROVIDER_SITE_OTHER): Payer: Medicare HMO

## 2022-01-01 VITALS — BP 130/80 | Ht 63.0 in | Wt 183.0 lb

## 2022-01-01 DIAGNOSIS — R1900 Intra-abdominal and pelvic swelling, mass and lump, unspecified site: Secondary | ICD-10-CM

## 2022-01-01 DIAGNOSIS — R300 Dysuria: Secondary | ICD-10-CM | POA: Diagnosis not present

## 2022-01-01 DIAGNOSIS — N898 Other specified noninflammatory disorders of vagina: Secondary | ICD-10-CM

## 2022-01-01 LAB — POCT URINALYSIS DIPSTICK
Bilirubin, UA: NEGATIVE
Blood, UA: NEGATIVE
Glucose, UA: POSITIVE — AB
Ketones, UA: NEGATIVE
Leukocytes, UA: NEGATIVE
Nitrite, UA: NEGATIVE
Protein, UA: NEGATIVE
Spec Grav, UA: 1.01 (ref 1.010–1.025)
Urobilinogen, UA: 1 E.U./dL
pH, UA: 5.5 (ref 5.0–8.0)

## 2022-01-01 MED ORDER — FLUCONAZOLE 150 MG PO TABS: 150.0000 mg | ORAL_TABLET | Freq: Once | ORAL | 0 refills | Status: AC

## 2022-01-01 NOTE — Progress Notes (Signed)
UTI Patient states starting Saturday she has experienced dysuria and genital irritation left side back pain.    PLAN:Diflucan sent for vaginal itching. Urine culture sent to lab.  Advise patient to increase water intake and to add cranberry juice.  Recommended extra strength tylenol and AZO OTC (** if not pregnant)

## 2022-01-04 LAB — URINE CULTURE

## 2022-01-10 ENCOUNTER — Telehealth: Payer: Self-pay | Admitting: Hematology and Oncology

## 2022-01-10 ENCOUNTER — Other Ambulatory Visit: Payer: Self-pay | Admitting: Hematology and Oncology

## 2022-01-10 NOTE — Telephone Encounter (Signed)
Scheduled per 12/1 in basket, message has been left

## 2022-02-12 ENCOUNTER — Encounter: Payer: Self-pay | Admitting: Primary Care

## 2022-02-12 ENCOUNTER — Ambulatory Visit (INDEPENDENT_AMBULATORY_CARE_PROVIDER_SITE_OTHER): Payer: Medicare HMO | Admitting: Primary Care

## 2022-02-12 VITALS — BP 136/80 | HR 88 | Temp 98.6°F | Ht 63.0 in | Wt 179.0 lb

## 2022-02-12 DIAGNOSIS — U071 COVID-19: Secondary | ICD-10-CM | POA: Insufficient documentation

## 2022-02-12 HISTORY — DX: COVID-19: U07.1

## 2022-02-12 LAB — POC COVID19 BINAXNOW: SARS Coronavirus 2 Ag: POSITIVE — AB

## 2022-02-12 MED ORDER — NIRMATRELVIR/RITONAVIR (PAXLOVID)TABLET: 3.0000 | ORAL_TABLET | Freq: Two times a day (BID) | ORAL | 0 refills | Status: AC

## 2022-02-12 NOTE — Addendum Note (Signed)
Addended by: Pat Kocher on: 02/12/2022 11:05 AM   Modules accepted: Orders

## 2022-02-12 NOTE — Patient Instructions (Signed)
Paxolovid prescription has been sent to the pharmacy. Follow the instructions on the pill pack.   As you are on Day 5 of your symptoms, continue to quarantine today and wear your mask for the next five days.   It was a pleasure to see you today!

## 2022-02-12 NOTE — Progress Notes (Signed)
Subjective:    Patient ID: Brandy Guerrero, female    DOB: 08-15-1950, 72 y.o.   MRN: [id number]  Sore Throat  Associated symptoms include coughing. Pertinent negatives include no congestion or headaches.    Brandy Guerrero is a very pleasant 72 y.o. female patient of Dr. Danise Mina with a history of type 2 diabetes, palpitations, ductal hyperplasia of left breast, who presents today to discuss sore throat.   Symptom onset five days ago with itchy throat. She then developed fevers, chest congestion, and cough. Today she reports that all symptoms have resolved except for her cough. She has not tested for Covid-19 infection.   Today she's feeling overall better. Her scratchy throat and fever symptoms have abated. She continues with a dry cough that has caused chest wall muscle aches. She's been taking Mucinex DM and Nyquill with temporary improvement.   BP Readings from Last 3 Encounters:  02/12/22 136/80  01/01/22 130/80  12/04/21 134/72      Review of Systems  Constitutional:  Negative for chills, fatigue and fever.  HENT:  Negative for congestion and sore throat.   Respiratory:  Positive for cough.   Neurological:  Negative for headaches.         Past Medical History:  Diagnosis Date   Acne    dermatology - on doxy '100mg'$  daily   Anemia    Breast cancer ([place])    Bursitis of right shoulder    History of atrial fibrillation 05/11/2005   h/o parox Afib, nl cardiolite/echo, no prolonged episodes.  metoprolol prn Claib[facility] Billings)   HLD (hyperlipidemia)    Obesity    Panic attacks    Positive occult stool blood test 02/11/2012   s/p normal colonoscopy, rpt stool cards negative    Social History   Socioeconomic History   Marital status: Married    Spouse name: Not on file   Number of children: Not on file   Years of education: Not on file   Highest education level: Not on file  Occupational History   Not on file  Tobacco Use   Smoking status: Never   Smokeless tobacco:  Never  Vaping Use   Vaping Use: Never used  Substance and Sexual Activity   Alcohol use: Yes    Alcohol/week: 0.0 standard drinks of alcohol    Comment: Rare   Drug use: No   Sexual activity: Yes    Birth control/protection: Surgical    Comment: hysterectomy  Other Topics Concern   Not on file  Social History Narrative   Caffeine: none    Lives with husband, has grown children, outside pets (husband is Biomedical engineer)    Occupation: Quarry manager at [employer], works 2nd shift, [employer] on weekends    Activity: starting regular walking - 3 mi about 2d/wk    Diet: good water, fruits/vegetables daily    Social Determinants of Radio broadcast assistant Strain: Not on file  Food Insecurity: Not on file  Transportation Needs: Not on file  Physical Activity: Not on file  Stress: Not on file  Social Connections: Not on file  Intimate Partner Violence: Not on file    Past Surgical History:  Procedure Laterality Date   ABDOMINAL HYSTERECTOMY  1990s   for fibroids, has one ovary remaining ([place]). partial   BREAST BIOPSY     BREAST LUMPECTOMY WITH RADIOACTIVE SEED AND SENTINEL LYMPH NODE BIOPSY Left 09/26/2020   Procedure: LEFT BREAST LUMPECTOMY WITH RADIOACTIVE SEED AND SENTINEL LYMPH NODE  BIOPSY;  Surgeon: Jovita Kussmaul, MD;  Location: [facility];  Service: General;  Laterality: Left;   COLONOSCOPY  01/2012   mod diverticulosis, rec rpt stool cards and if + rec EGD (returned negative), rpt colonoscopy 10 yrs    Family History  Problem Relation Age of Onset   Hypertension Mother    Alcohol abuse Brother    Alcohol abuse Father    Diabetes Daughter    Cancer Neg Hx    Coronary artery disease Neg Hx    Stroke Neg Hx     Allergies  Allergen Reactions   Sulfa Antibiotics Rash    Current Outpatient Medications on File Prior to Visit  Medication Sig Dispense Refill   atorvastatin (LIPITOR) 20 MG tablet Take 1 tablet (20 mg total) by mouth daily. 90 tablet 3   Cholecalciferol  (VITAMIN D3) 25 MCG (1000 UT) CAPS Take 2 capsules (2,000 Units total) by mouth daily. 30 capsule    clindamycin (CLEOCIN T) 1 % external solution Apply 1-2 times daily to scalp as needed for breakouts 60 mL 5   empagliflozin (JARDIANCE) 10 MG TABS tablet Take 1 tablet (10 mg total) by mouth daily before breakfast. 30 tablet 6   fluocinonide (LIDEX) 0.05 % external solution Apply 1-2 drops once or twice daily to affected areas on scalp as needed for itching 60 mL 2   ketoconazole (NIZORAL) 2 % shampoo 1-2 times per week lather on scalp, leave on 8-10 minutes, rinse well 120 mL 5   latanoprost (XALATAN) 0.005 % ophthalmic solution Place 1 drop into the left eye at bedtime.     letrozole (FEMARA) 2.5 MG tablet Take 1 tablet by mouth once daily 90 tablet 0   metoprolol tartrate (LOPRESSOR) 25 MG tablet Take 1 tablet (25 mg total) by mouth 2 (two) times daily as needed (heart fluttering). 30 tablet 1   minoxidil (LONITEN) 2.5 MG tablet Take 1 tablet once daily 30 tablet 2   vitamin E 180 MG (400 UNITS) capsule Take 400 Units by mouth daily. Take two by mouth daily     metFORMIN (GLUCOPHAGE) 500 MG tablet Take 1 tablet (500 mg total) by mouth in the morning, at noon, and at bedtime. (Patient not taking: Reported on 02/12/2022) 270 tablet 3   No current facility-administered medications on file prior to visit.    BP 136/80   Pulse 88   Temp 98.6 F (37 C) (Temporal)   Ht '5\' 3"'$  (1.6 m)   Wt 179 lb (81.2 kg)   SpO2 98%   BMI 31.71 kg/m  Objective:   Physical Exam HENT:     Right Ear: Tympanic membrane and ear canal normal.     Left Ear: Tympanic membrane and ear canal normal.     Nose:     Right Sinus: No maxillary sinus tenderness or frontal sinus tenderness.     Left Sinus: No maxillary sinus tenderness or frontal sinus tenderness.     Mouth/Throat:     Pharynx: No posterior oropharyngeal erythema.  Eyes:     Conjunctiva/sclera: Conjunctivae normal.  Cardiovascular:     Rate and Rhythm:  Normal rate and regular rhythm.  Pulmonary:     Effort: Pulmonary effort is normal.     Breath sounds: Normal breath sounds. No wheezing or rales.     Comments: Dry cough noted a few times during exam Musculoskeletal:     Cervical back: Neck supple.  Lymphadenopathy:     Cervical: No cervical adenopathy.  Skin:    General: Skin is warm and dry.           Assessment & Plan:   Problem List Items Addressed This Visit       Other   COVID-19 - Primary    Rapid Covid test positive today.  Qualifies for antiviral therapy given her age and her past medical history, prescription for Paxlovid given. GFR was 81.18 on 05/22/21.  She is within window treatment. Discussed with patient today, she agrees to proceed. Instructions provided.   Patient advised to continue to use Mucinex DM, tylenol and Delsym as needed.  Educated on the importance of staying hydrated and resting. Educated on quarantine and mask use for the next five days.   ED precautions provided. Work note provided through 02/16/22.  I evaluated patient, was consulted regarding treatment, and agree with assessment and plan per Tinnie Gens, RN, DNP student.   Allie Bossier, NP-C       Relevant Medications   nirmatrelvir/ritonavir (PAXLOVID) 20 x 150 MG & 10 x '100MG'$  TABS       Pleas Koch, NP

## 2022-02-12 NOTE — Progress Notes (Signed)
Established Patient Office Visit  Subjective   Patient ID: Brandy Guerrero, female    DOB: [birth year]  Age: 72 y.o. MRN: [id number]  Chief Complaint  Patient presents with   Sore Throat    Symptoms started 12/29 Itchy throat, fever, cough, congestion. Symptoms have now mainly resolved except for her productive cough.  Has not done any testing    Sore Throat  Associated symptoms include congestion, coughing and headaches. Pertinent negatives include no diarrhea, ear pain, shortness of breath or vomiting.    Brandy Guerrero. Rising is a 72 year old female, patient of Dr. Danise Mina,  with past medical history of hyperlipidemia, type 2 diabetes, malignant neoplasm of upper-outer quadrant of left breast, estrogen receptor positive who presents today with itchy throat, fever, cough and congestion that started five days ago.   Her symptoms started on 02/07/22 with congestion, sore throat, fever, body aches. She had some relief with chloraseptic spray, tylenol, Nyquil and Mucinex dm.  She states it hurts to cough. She has a productive cough with clear phlegm. She has been staying hydrated with chicken noodle soup, water and coke. She was with her daughter on christmas who had the flu and kept her mask on.   She does report that she is feeling better than she was two days ago. Her main concern is the soreness she has from coughing. She denies any nausea, vomiting or diarrhea.   She did complete her flu vaccine this season.    Patient Active Problem List   Diagnosis Date Noted   COVID-19 02/12/2022   Malignant neoplasm of upper-outer quadrant of left breast in female, estrogen receptor positive ([place]) 08/31/2020   Medicare annual wellness visit, subsequent 04/24/2020   Insomnia 09/24/2018   Advanced care planning/counseling discussion 10/28/2017   Atypical ductal hyperplasia of left breast 06/17/2016   Health maintenance examination 10/21/2011   Type 2 diabetes mellitus with other specified  complication ([place]) 02/58/5277   Palpitations 82/42/3536   Umbilical hernia 14/43/1540   Obesity, Class I, BMI 30-34.9 09/17/2011   Panic attacks    Hyperlipidemia associated with type 2 diabetes mellitus ([place])    Acne    Pelvic mass in female    Past Medical History:  Diagnosis Date   Acne    dermatology - on doxy '100mg'$  daily   Anemia    Breast cancer ([place])    Bursitis of right shoulder    History of atrial fibrillation 05/11/2005   h/o parox Afib, nl cardiolite/echo, no prolonged episodes.  metoprolol prn Claiborne Billings)   HLD (hyperlipidemia)    Obesity    Panic attacks    Positive occult stool blood test 02/11/2012   s/p normal colonoscopy, rpt stool cards negative   Past Surgical History:  Procedure Laterality Date   ABDOMINAL HYSTERECTOMY  1990s   for fibroids, has one ovary remaining ([place]). partial   BREAST BIOPSY     BREAST LUMPECTOMY WITH RADIOACTIVE SEED AND SENTINEL LYMPH NODE BIOPSY Left 09/26/2020   Procedure: LEFT BREAST LUMPECTOMY WITH RADIOACTIVE SEED AND SENTINEL LYMPH NODE BIOPSY;  Surgeon: Jovita Kussmaul, MD;  Location: [facility];  Service: General;  Laterality: Left;   COLONOSCOPY  01/2012   mod diverticulosis, rec rpt stool cards and if + rec EGD (returned negative), rpt colonoscopy 10 yrs   Social History   Tobacco Use   Smoking status: Never   Smokeless tobacco: Never  Vaping Use   Vaping Use: Never used  Substance Use Topics   Alcohol use:  Yes    Alcohol/week: 0.0 standard drinks of alcohol    Comment: Rare   Drug use: No   Family Status  Relation Name Status   Mother  Alive   Brother  (Not Specified)   Father  Deceased   Daughter  (Not Specified)   Neg Hx  (Not Specified)   Allergies  Allergen Reactions   Sulfa Antibiotics Rash      Review of Systems  Constitutional:  Positive for chills and malaise/fatigue. Negative for fever.  HENT:  Positive for congestion and sore throat. Negative for ear pain and sinus pain.   Respiratory:  Positive  for cough and sputum production. Negative for shortness of breath.   Gastrointestinal:  Negative for diarrhea, nausea and vomiting.  Musculoskeletal:  Positive for myalgias.  Neurological:  Positive for headaches.      Objective:     BP 136/80   Pulse 88   Temp 98.6 F (37 C) (Temporal)   Ht '5\' 3"'$  (1.6 m)   Wt 179 lb (81.2 kg)   SpO2 98%   BMI 31.71 kg/m  BP Readings from Last 3 Encounters:  02/12/22 136/80  01/01/22 130/80  12/04/21 134/72   Wt Readings from Last 3 Encounters:  02/12/22 179 lb (81.2 kg)  01/01/22 183 lb (83 kg)  12/04/21 189 lb 6 oz (85.9 kg)      Physical Exam Vitals and nursing note reviewed.  Cardiovascular:     Rate and Rhythm: Normal rate and regular rhythm.     Pulses: Normal pulses.     Heart sounds: Normal heart sounds.  Pulmonary:     Effort: Pulmonary effort is normal.     Breath sounds: Normal breath sounds.  Skin:    General: Skin is warm and dry.  Neurological:     Mental Status: She is alert and oriented to person, place, and time.  Psychiatric:        Mood and Affect: Mood normal.        Behavior: Behavior normal.      No results found for any visits on 02/12/22.     The 10-year ASCVD risk score (Arnett DK, et al., 2019) is: 25.2%    Assessment & Plan:   Problem List Items Addressed This Visit       Other   COVID-19 - Primary    Rapid Covid test positive.   Qualifies for antiviral therapy given her age and her past medical history, prescription for Paxlovid given. GFR was 81.18 on 05/22/21.   Patient advised to continue to use Mucinex DM, tylenol and Delsym as needed.  Educated on the importance of staying hydrated and resting. Educated on quarantine and mask use for the next five days.   ED precautions provided. Work note provided through 02/16/22.      Relevant Medications   nirmatrelvir/ritonavir (PAXLOVID) 20 x 150 MG & 10 x '100MG'$  TABS    No follow-ups on file.    Tinnie Gens, BSN-RN, DNP STUDENT

## 2022-02-12 NOTE — Assessment & Plan Note (Addendum)
Rapid Covid test positive today.  Qualifies for antiviral therapy given her age and her past medical history, prescription for Paxlovid given. GFR was 81.18 on 05/22/21.  She is within window treatment. Discussed with patient today, she agrees to proceed. Instructions provided.   Patient advised to continue to use Mucinex DM, tylenol and Delsym as needed.  Educated on the importance of staying hydrated and resting. Educated on quarantine and mask use for the next five days.   ED precautions provided. Work note provided through 02/16/22.  I evaluated patient, was consulted regarding treatment, and agree with assessment and plan per Tinnie Gens, RN, DNP student.   Allie Bossier, NP-C

## 2022-02-20 ENCOUNTER — Telehealth: Payer: Self-pay | Admitting: Family Medicine

## 2022-02-20 NOTE — Telephone Encounter (Signed)
Unable to reach patient. Left voicemail to return call to our office.   

## 2022-02-20 NOTE — Telephone Encounter (Signed)
Please thank her for the update.  If she feeling worse?  About the same?  Any other symptoms except for the cough with congestion? (Fevers, chills, increased fatigue, shortness of breath)?

## 2022-02-20 NOTE — Telephone Encounter (Signed)
Patient called back in and she said she doesn't feel bad at all, just has the lingering cough. There is not much congestion, left just a little phlegm when she coughs. This is interrupting her work throughout the day and makes it hard to sleep. She denies any other symptoms

## 2022-02-20 NOTE — Telephone Encounter (Signed)
Pt called stating she saw Brandy Guerrero on 02/12/22 & wanted to let Brandy Guerrero know she still has a bad cough. Pt stated she's taken NyQuil & Mucinex (day&night). Pt stated she's still coughing up plem. Pt is requesting for some meds to be prescribed? [facility] [address] [place]

## 2022-02-20 NOTE — Telephone Encounter (Addendum)
Please thank her for the information. If the cough worse during the daytime or nighttime? Has she noticed any heartburn symptoms? Any throat drainage?

## 2022-02-21 NOTE — Telephone Encounter (Signed)
Her cough could be from silent reflux occurring at night which can be irritating during the day and night.  I recommend she try taking omeprazole 20 mg at bedtime until her cough resolves, at least for a few weeks.  She can find this over-the-counter at any drugstore.  If no improvement then follow-up with PCP.

## 2022-02-21 NOTE — Telephone Encounter (Signed)
Called and spoke with patient, she states her cough is worse at nighttime, but is consistent during the day as well. She stated yesterday she had some heartburn, took an Copywriter, advertising and it went away. She said she gets heartburn sometimes with certain foods she eats. She hasn't noticed any throat drainage, just the phlegm when she coughs.

## 2022-02-21 NOTE — Telephone Encounter (Signed)
Called patient and reviewed all information. Patient verbalized understanding. Will call if any further questions.  

## 2022-02-24 ENCOUNTER — Encounter: Payer: Self-pay | Admitting: Internal Medicine

## 2022-02-25 ENCOUNTER — Inpatient Hospital Stay: Payer: Medicare HMO | Attending: Hematology and Oncology | Admitting: Hematology and Oncology

## 2022-02-25 ENCOUNTER — Other Ambulatory Visit: Payer: Self-pay

## 2022-02-25 VITALS — BP 135/79 | HR 70 | Temp 97.2°F | Resp 15 | Wt 182.2 lb

## 2022-02-25 DIAGNOSIS — Z79811 Long term (current) use of aromatase inhibitors: Secondary | ICD-10-CM | POA: Diagnosis not present

## 2022-02-25 DIAGNOSIS — Z170 Estrogen receptor positive status [ER+]: Secondary | ICD-10-CM | POA: Diagnosis not present

## 2022-02-25 DIAGNOSIS — C50412 Malignant neoplasm of upper-outer quadrant of left female breast: Secondary | ICD-10-CM | POA: Diagnosis not present

## 2022-02-25 NOTE — Progress Notes (Signed)
Patient Care Team: Ria Bush, MD as PCP - General (Family Medicine) Jovita Kussmaul, MD as Consulting Physician (General Surgery) Nicholas Lose, MD as Consulting Physician (Hematology and Oncology) Noreene Filbert, MD as Consulting Physician (Radiation Oncology) Elsie Saas, MD as Consulting Physician (Orthopedic Surgery)  DIAGNOSIS:  Encounter Diagnosis  Name Primary?   Malignant neoplasm of upper-outer quadrant of left breast in female, estrogen receptor positive ([place]) Yes    SUMMARY OF ONCOLOGIC HISTORY: Oncology History  Atypical ductal hyperplasia of left breast  08/02/2020 Initial Diagnosis   Invasive ductal carcinoma of the left breast  Biopsy of left breast calcifications on 05/23/2016 showed atypical ductal hyperplasia. Diagnostic mammogram and [place] on 07/13/20 showed a 7 mm mass in the 10 o'clock position of the left breast. 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   Malignant neoplasm of upper-outer quadrant of left breast in female, estrogen receptor positive ([place])  08/02/2020 Initial Diagnosis   Screening mammogram detected left breast mass measuring 7 mm at 10 o'clock position.  Biopsy revealed grade 2 IDC with DCIS with calcifications, ER 90%, PR 100%, Ki67 10%, HER2 negative   09/26/2020 Surgery   Left lumpectomy: Grade 1 IDC 0.8 cm, with DCIS low-grade, 0/5 lymph nodes, ER 90%, PR 100%, HER2 negative, Ki-67 10%   10/30/2020 - 11/28/2020 Radiation Therapy   Adj XRT at [facility]   12/24/2020 -  Anti-estrogen oral therapy   Letrozole 2.5 mg daily   03/23/2021 Cancer Staging   Staging form: Breast, AJCC 8th Edition - Pathologic: Stage IA (pT1b, pN0, cM0, G1, ER+, PR+, HER2-) - Signed by Gardenia Phlegm, NP on 03/23/2021 Histologic grading system: 3 grade system     CHIEF COMPLIANT: Follow-up of left breast cancer on letrozole    INTERVAL HISTORY: Brandy Guerrero is a  72 y.o. with above-mentioned history of invasive ductal carcinoma of the left breast having undergone lumpectomy. She presents to the clinic for a follow-up. She states that she does have some moderate hot flashes. She says they come and goes. She denies joint stiffness. She denies any pain or discomfort in breast. She state she is not as active but she does work every other weekend. Overall she is tolerating the letrozole exteremly well with no concerns.     ALLERGIES:  is allergic to sulfa antibiotics.  MEDICATIONS:  Current Outpatient Medications  Medication Sig Dispense Refill   atorvastatin (LIPITOR) 20 MG tablet Take 1 tablet (20 mg total) by mouth daily. 90 tablet 3   Cholecalciferol (VITAMIN D3) 25 MCG (1000 UT) CAPS Take 2 capsules (2,000 Units total) by mouth daily. 30 capsule    clindamycin (CLEOCIN T) 1 % external solution Apply 1-2 times daily to scalp as needed for breakouts 60 mL 5   empagliflozin (JARDIANCE) 10 MG TABS tablet Take 1 tablet (10 mg total) by mouth daily before breakfast. 30 tablet 6   fluocinonide (LIDEX) 0.05 % external solution Apply 1-2 drops once or twice daily to affected areas on scalp as needed for itching 60 mL 2   ketoconazole (NIZORAL) 2 % shampoo 1-2 times per week lather on scalp, leave on 8-10 minutes, rinse well 120 mL 5   latanoprost (XALATAN) 0.005 % ophthalmic solution Place 1 drop into the left eye at bedtime.     letrozole (FEMARA) 2.5 MG tablet Take 1 tablet by mouth once daily 90 tablet 0   metFORMIN (GLUCOPHAGE) 500 MG tablet Take 1 tablet (500  mg total) by mouth in the morning, at noon, and at bedtime. 270 tablet 3   metoprolol tartrate (LOPRESSOR) 25 MG tablet Take 1 tablet (25 mg total) by mouth 2 (two) times daily as needed (heart fluttering). 30 tablet 1   minoxidil (LONITEN) 2.5 MG tablet Take 1 tablet once daily 30 tablet 2   vitamin E 180 MG (400 UNITS) capsule Take 400 Units by mouth daily. Take two by mouth daily     No current  facility-administered medications for this visit.    PHYSICAL EXAMINATION: ECOG PERFORMANCE STATUS: 1 - Symptomatic but completely ambulatory  Vitals:   02/25/22 1118  BP: 135/79  Pulse: 70  Resp: 15  Temp: (!) 97.2 F (36.2 C)  SpO2: 100%   Filed Weights   02/25/22 1118  Weight: 182 lb 3.2 oz (82.6 kg)    BREAST: No palpable masses or nodules in either right or left breasts. No palpable axillary supraclavicular or infraclavicular adenopathy no breast tenderness or nipple discharge. (exam performed in the presence of a chaperone)  LABORATORY DATA:  I have reviewed the data as listed    Latest Ref Rng & Units 05/22/2021   10:58 AM 12/05/2020    9:08 AM 09/21/2020   11:45 AM  CMP  Glucose 70 - 99 mg/dL 196  166  149   BUN 6 - 23 mg/dL '15  12  17   '$ Creatinine 0.40 - 1.20 mg/dL 0.75  0.69  0.79   Sodium 135 - 145 mEq/L 139  139  140   Potassium 3.5 - 5.1 mEq/L 4.0  4.1  4.2   Chloride 96 - 112 mEq/L 103  103  104   CO2 19 - 32 mEq/L '30  30  28   '$ Calcium 8.4 - 10.5 mg/dL 9.3  9.3  9.4   Total Protein 6.0 - 8.3 g/dL 6.9     Total Bilirubin 0.2 - 1.2 mg/dL 0.7     Alkaline Phos 39 - 117 U/L 140     AST 0 - 37 U/L 13     ALT 0 - 35 U/L 13       Lab Results  Component Value Date   WBC 5.6 05/22/2021   HGB 12.0 05/22/2021   HCT 36.1 05/22/2021   MCV 91.0 05/22/2021   PLT 254.0 05/22/2021   NEUTROABS 3.8 05/22/2021    ASSESSMENT & PLAN:  Malignant neoplasm of upper-outer quadrant of left breast in female, estrogen receptor positive ([place]) Diagnostic mammogram and [place] on 07/13/20 showed a 7 mm mass in the 10 o'clock position of the left breast. 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   09/26/2020:Left lumpectomy: Grade 1 IDC 0.8 cm, with DCIS low-grade, 0/5 lymph nodes, ER 90%, PR 100%, HER2 negative, Ki-67 10%   Treatment plan: 1.  adjuvant radiation at [facility] completed 11/28/20 2.   Followed by adjuvant antiestrogen therapy started 12/24/20  Letrozole toxicities: Hot flashes: Patient had these even before starting treatment. Fatigue  Breast cancer surveillance: Breast exam 02/25/2022: Benign Mammogram 10/02/2021: Benign breast density category C  Return to clinic in 1 year for follow-up    No orders of the defined types were placed in this encounter.  The patient has a good understanding of the overall plan. she agrees with it. she will call with any problems that may develop before the next visit here. Total time spent: 30 mins including face to face time and time spent  for planning, charting and co-ordination of care   Harriette Ohara, MD 02/25/22    I Gardiner Coins am acting as a Education administrator for [employer]  I have reviewed the above documentation for accuracy and completeness, and I agree with the above.

## 2022-02-25 NOTE — Assessment & Plan Note (Addendum)
Diagnostic mammogram and [place] on 07/13/20 showed a 7 mm mass in the 10 o'clock position of the left breast. 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   09/26/2020:Left lumpectomy: Grade 1 IDC 0.8 cm, with DCIS low-grade, 0/5 lymph nodes, ER 90%, PR 100%, HER2 negative, Ki-67 10%   Treatment plan: 1.  adjuvant radiation at [facility] completed 11/28/20 2.  Followed by adjuvant antiestrogen therapy started 12/24/20  Letrozole toxicities: Hot flashes: Patient had these even before starting treatment. Fatigue  Breast cancer surveillance: Breast exam 02/25/2022: Benign Mammogram 10/02/2021: Benign breast density category C  Return to clinic in 1 year for follow-up

## 2022-03-06 ENCOUNTER — Telehealth: Payer: Self-pay

## 2022-03-06 ENCOUNTER — Ambulatory Visit (AMBULATORY_SURGERY_CENTER): Payer: Medicare HMO

## 2022-03-06 VITALS — Ht 63.0 in | Wt 180.0 lb

## 2022-03-06 DIAGNOSIS — Z1211 Encounter for screening for malignant neoplasm of colon: Secondary | ICD-10-CM

## 2022-03-06 MED ORDER — PLENVU 140 G PO SOLR: 1.0000 | ORAL | Status: DC

## 2022-03-06 NOTE — Telephone Encounter (Signed)
Left message that we had a previsit appointment at 1pm and that I would call back in a few minutes to try and go over info for her upcoming colonoscopy  Patient called back and went over complete instructions.  Verbalized understanding of prep

## 2022-03-06 NOTE — Progress Notes (Signed)
No egg or soy allergy known to patient  No issues known to pt with past sedation with any surgeries or procedures Patient denies ever being told they had issues or difficulty with intubation  No FH of Malignant Hyperthermia Pt is not on diet pills Pt is not on  home 02  Pt is not on blood thinners  Pt denies issues with constipation  No A fib or A flutter Have any cardiac testing pending--no Pt instructed to use [email or web address] or GoodRx for a price reduction on prep   

## 2022-03-13 HISTORY — PX: COLONOSCOPY: SHX174

## 2022-03-20 ENCOUNTER — Encounter: Payer: Self-pay | Admitting: Gastroenterology

## 2022-03-27 ENCOUNTER — Telehealth: Payer: Self-pay | Admitting: Gastroenterology

## 2022-03-27 ENCOUNTER — Telehealth: Payer: Self-pay | Admitting: *Deleted

## 2022-03-27 NOTE — Telephone Encounter (Signed)
Spoke with Pharmacy tech who stated they did not have Plenvu in stock. RN to call pt and give option of Suprep with cost info or Golytely

## 2022-03-27 NOTE — Telephone Encounter (Signed)
Patient returning call, stated pharmacy has yet to receive prep medication.

## 2022-03-27 NOTE — Telephone Encounter (Signed)
Returning pt's call but unable to reach [facility] information. Left call back #.

## 2022-03-27 NOTE — Telephone Encounter (Signed)
Pt had ?'s related to instructions. Answered all ?'s and pt stated she understood.

## 2022-03-27 NOTE — Telephone Encounter (Signed)
Attempted to call pt to discuss prep. Unable to reach. Will call pt on 03/28/22.

## 2022-03-27 NOTE — Telephone Encounter (Signed)
Inbound call from pt requesting to speak with a nurse regarding instruction for up coming procedure .Please advise

## 2022-03-27 NOTE — Telephone Encounter (Signed)
Attempted to reach pt to discuss Prep options. Unable to reach,. Left call back #

## 2022-03-28 MED ORDER — PEG 3350-KCL-NA BICARB-NACL 420 G PO SOLR: 4000.0000 mL | Freq: Once | ORAL | 0 refills | Status: AC

## 2022-03-28 NOTE — Telephone Encounter (Signed)
Spoke with pt and medication prep choice made by pt. Medication ordered after Pharmacy verification. Rn to call pt back to review instructions since pt had to leave.

## 2022-03-28 NOTE — Telephone Encounter (Signed)
Spoke with pt and gave instructions for new prep. Pt stated she understood instructioins and returned verbally her understanding of instructions which where correct. States she will call Monday if has a question.

## 2022-03-28 NOTE — Telephone Encounter (Signed)
Call to discuss prep choices. Left call back #

## 2022-03-28 NOTE — Addendum Note (Signed)
Addended by: Jacqualine Code on: 03/28/2022 09:53 AM   Modules accepted: Orders

## 2022-04-02 ENCOUNTER — Ambulatory Visit (AMBULATORY_SURGERY_CENTER): Payer: Medicare HMO | Admitting: Gastroenterology

## 2022-04-02 ENCOUNTER — Encounter: Payer: Self-pay | Admitting: Gastroenterology

## 2022-04-02 VITALS — BP 133/60 | HR 61 | Temp 98.4°F | Resp 17 | Ht 63.0 in | Wt 180.0 lb

## 2022-04-02 DIAGNOSIS — D122 Benign neoplasm of ascending colon: Secondary | ICD-10-CM

## 2022-04-02 DIAGNOSIS — Z1211 Encounter for screening for malignant neoplasm of colon: Secondary | ICD-10-CM

## 2022-04-02 DIAGNOSIS — D123 Benign neoplasm of transverse colon: Secondary | ICD-10-CM

## 2022-04-02 DIAGNOSIS — D120 Benign neoplasm of cecum: Secondary | ICD-10-CM | POA: Diagnosis not present

## 2022-04-02 MED ORDER — SODIUM CHLORIDE 0.9 % IV SOLN: 500.0000 mL | INTRAVENOUS | Status: DC

## 2022-04-02 NOTE — Progress Notes (Signed)
Called to room to assist during endoscopic procedure.  Patient ID and intended procedure confirmed with present staff. Received instructions for my participation in the procedure from the performing physician.  

## 2022-04-02 NOTE — Progress Notes (Signed)
[facility] History and Physical   Primary Care Physician:  Ria Bush, MD   Reason for Procedure:   Colon cancer screening  Plan:    colonoscopy     HPI: Brandy Guerrero is a 72 y.o. female  here for colonoscopy screening - last exam 01/2012 - Dr. Olevia Perches - was normal without polyps.   Patient denies any bowel symptoms at this time. No family history of colon cancer known. Otherwise feels well without any cardiopulmonary symptoms.   I have discussed risks / benefits of anesthesia and endoscopic procedure with Brandy Guerrero and they wish to proceed with the exams as outlined today.    Past Medical History:  Diagnosis Date   Acne    dermatology - on doxy 190m daily   Anemia    [place]    Bursitis of right shoulder    History of atrial fibrillation 05/11/2005   h/o parox Afib, nl cardiolite/echo, no prolonged episodes.  metoprolol prn ([facility]   HLD (hyperlipidemia)    Obesity    Panic attacks    Positive occult stool blood test 02/11/2012   s/p normal colonoscopy, rpt stool cards negative    Past Surgical History:  Procedure Laterality Date   ABDOMINAL HYSTERECTOMY  1990s   for fibroids, has one ovary remaining ([place]). partial   BREAST BIOPSY     BREAST LUMPECTOMY WITH RADIOACTIVE SEED AND SENTINEL LYMPH NODE BIOPSY Left 09/26/2020   Procedure: LEFT BREAST LUMPECTOMY WITH RADIOACTIVE SEED AND SENTINEL LYMPH NODE BIOPSY;  Surgeon: TJovita Kussmaul MD;  [place]  Service: General;  Laterality: Left;   COLONOSCOPY  01/2012   mod diverticulosis, rec rpt stool cards and if + rec EGD (returned negative), rpt colonoscopy 10 yrs    Prior to Admission medications   Medication Sig Start Date End Date Taking? Authorizing Provider  atorvastatin (LIPITOR) 20 MG tablet Take 1 tablet (20 mg total) by mouth daily. 05/22/21  Yes GRia Bush MD  Cholecalciferol (VITAMIN D3) 25 MCG (1000 UT) CAPS Take 2 capsules (2,000 Units total) by  mouth daily. 05/22/21  Yes GRia Bush MD  latanoprost (XALATAN) 0.005 % ophthalmic solution Place 1 drop into the left eye at bedtime. 04/23/20  Yes GRia Bush MD  letrozole ([facility] 2.5 MG tablet Take 1 tablet by mouth once daily 01/10/22  Yes GNicholas Lose MD  metFORMIN (GLUCOPHAGE) 500 MG tablet Take 1 tablet (500 mg total) by mouth in the morning, at noon, and at bedtime. 05/29/21  Yes GRia Bush MD  metoprolol tartrate (LOPRESSOR) 25 MG tablet Take 1 tablet (25 mg total) by mouth 2 (two) times daily as needed (heart fluttering). 11/27/20 05/05/23 Yes GRia Bush MD  minoxidil (LONITEN) 2.5 MG tablet Take 1 tablet once daily 12/17/21  Yes SBrendolyn Patty MD  vitamin E 180 MG (400 UNITS) capsule Take 400 Units by mouth daily. Take two by mouth daily   Yes [provider]  clindamycin (CLEOCIN T) 1 % external solution Apply 1-2 times daily to scalp as needed for breakouts 10/16/21   SBrendolyn Patty MD  empagliflozin (JARDIANCE) 10 MG TABS tablet Take 1 tablet (10 mg total) by mouth daily before breakfast. Patient not taking: Reported on 04/02/2022 12/04/21   GRia Bush MD  fluocinonide (LIDEX) 0.05 % external solution Apply 1-2 drops once or twice daily to affected areas on scalp as needed for itching Patient not taking: Reported on 04/02/2022 12/17/21   SBrendolyn Patty MD  ketoconazole (NIZORAL) 2 %  shampoo 1-2 times per week lather on scalp, leave on 8-10 minutes, rinse well Patient not taking: Reported on 04/02/2022 10/16/21   Brendolyn Patty, MD    Current Outpatient Medications  Medication Sig Dispense Refill   atorvastatin (LIPITOR) 20 MG tablet Take 1 tablet (20 mg total) by mouth daily. 90 tablet 3   Cholecalciferol (VITAMIN D3) 25 MCG (1000 UT) CAPS Take 2 capsules (2,000 Units total) by mouth daily. 30 capsule    latanoprost (XALATAN) 0.005 % ophthalmic solution Place 1 drop into the left eye at bedtime.     letrozole (FEMARA) 2.5 MG tablet Take 1 tablet  by mouth once daily 90 tablet 0   metFORMIN (GLUCOPHAGE) 500 MG tablet Take 1 tablet (500 mg total) by mouth in the morning, at noon, and at bedtime. 270 tablet 3   metoprolol tartrate (LOPRESSOR) 25 MG tablet Take 1 tablet (25 mg total) by mouth 2 (two) times daily as needed (heart fluttering). 30 tablet 1   minoxidil (LONITEN) 2.5 MG tablet Take 1 tablet once daily 30 tablet 2   vitamin E 180 MG (400 UNITS) capsule Take 400 Units by mouth daily. Take two by mouth daily     clindamycin (CLEOCIN T) 1 % external solution Apply 1-2 times daily to scalp as needed for breakouts 60 mL 5   empagliflozin (JARDIANCE) 10 MG TABS tablet Take 1 tablet (10 mg total) by mouth daily before breakfast. (Patient not taking: Reported on 04/02/2022) 30 tablet 6   fluocinonide (LIDEX) 0.05 % external solution Apply 1-2 drops once or twice daily to affected areas on scalp as needed for itching (Patient not taking: Reported on 04/02/2022) 60 mL 2   ketoconazole (NIZORAL) 2 % shampoo 1-2 times per week lather on scalp, leave on 8-10 minutes, rinse well (Patient not taking: Reported on 04/02/2022) 120 mL 5   Current Facility-Administered Medications  Medication Dose Route Frequency Provider Last Rate Last Admin   0.9 %  sodium chloride infusion  500 mL Intravenous Continuous Nisaiah Bechtol, Carlota Raspberry, MD        Allergies as of 04/02/2022 - Review Complete 04/02/2022  Allergen Reaction Noted   Sulfa antibiotics Rash     Family History  Problem Relation Age of Onset   Hypertension Mother    Alcohol abuse Father    Alcohol abuse Brother    Diabetes Daughter    Cancer Neg Hx    Coronary artery disease Neg Hx    Stroke Neg Hx    Colon cancer Neg Hx    Colon polyps Neg Hx    Esophageal cancer Neg Hx    Rectal cancer Neg Hx    Stomach cancer Neg Hx     Social History   Socioeconomic History   Marital status: Married    Spouse name: Not on file   Number of children: Not on file   Years of education: Not on file    Highest education level: Not on file  Occupational History   Not on file  Tobacco Use   Smoking status: Never   Smokeless tobacco: Never  Vaping Use   Vaping Use: Never used  Substance and Sexual Activity   Alcohol use: Yes    Alcohol/week: 0.0 standard drinks of alcohol    Comment: Rare   Drug use: No   Sexual activity: Yes    Birth control/protection: Surgical    Comment: hysterectomy  Other Topics Concern   Not on file  Social History Narrative   Caffeine:  none    Lives with husband, has grown children, outside pets (husband is Biomedical engineer)    Occupation: Quarry manager at [employer], works 2nd shift, [employer] on weekends    Activity: starting regular walking - 3 mi about 2d/wk    Diet: good water, fruits/vegetables daily    Social Determinants of Radio broadcast assistant Strain: Not on file  Food Insecurity: Not on file  Transportation Needs: Not on file  Physical Activity: Not on file  Stress: Not on file  Social Connections: Not on file  Intimate Partner Violence: Not on file    Review of Systems: All other review of systems negative except as mentioned in the HPI.  Physical Exam: Vital signs BP 118/64   Pulse 76   Temp 98.4 F (36.9 C)   Ht 5' 3"$  (1.6 m)   Wt 180 lb (81.6 kg)   SpO2 97%   BMI 31.89 kg/m   General:   Alert,  Well-developed, pleasant and cooperative in NAD Lungs:  Clear throughout to auscultation.   Heart:  Regular rate and rhythm Abdomen:  Soft, nontender and nondistended.   Neuro/Psych:  Alert and cooperative. Normal mood and affect. A and O x 3  Jolly Mango, MD [facility]

## 2022-04-02 NOTE — Progress Notes (Signed)
Pt's states no medical or surgical changes since previsit or office visit. 

## 2022-04-02 NOTE — Op Note (Signed)
[place] Patient Name: Brandy Guerrero Procedure Date: 04/02/2022 10:51 AM MRN: [id number] Endoscopist: Remo Lipps P. Havery Moros , MD, [id number] Age: 72 Referring MD:  Date of Birth: [birth year] Gender: Female Account #: [id number] Procedure:                Colonoscopy Indications:              Screening for colorectal malignant neoplasm Medicines:                Monitored Anesthesia Care Procedure:                Pre-Anesthesia Assessment:                           - Prior to the procedure, a History and Physical                            was performed, and patient medications and                            allergies were reviewed. The patient's tolerance of                            previous anesthesia was also reviewed. The risks                            and benefits of the procedure and the sedation                            options and risks were discussed with the patient.                            All questions were answered, and informed consent                            was obtained. Prior Anticoagulants: The patient has                            taken no anticoagulant or antiplatelet agents. ASA                            Grade Assessment: III - A patient with severe                            systemic disease. After reviewing the risks and                            benefits, the patient was deemed in satisfactory                            condition to undergo the procedure.                           After obtaining informed consent, the colonoscope  was passed under direct vision. Throughout the                            procedure, the patient's blood pressure, pulse, and                            oxygen saturations were monitored continuously. The                            [id number]-HQ190L Colonoscope [id number] was introduced                            through the anus and advanced to the the cecum,                             identified by appendiceal orifice and ileocecal                            valve. The colonoscopy was performed without                            difficulty. The patient tolerated the procedure                            well. The quality of the bowel preparation was                            good. The ileocecal valve, appendiceal orifice, and                            rectum were photographed. Scope In: 11:05:02 AM Scope Out: 11:29:34 AM Scope Withdrawal Time: 0 hours 16 minutes 35 seconds  Total Procedure Duration: 0 hours 24 minutes 32 seconds  Findings:                 The perianal and digital rectal examinations were                            normal.                           Many medium-mouthed diverticula were found in the                            entire colon.                           The colon was tortuous.                           Two sessile polyps were found in the cecum. The                            polyps were 2 to 3 mm in size. These polyps were  removed with a cold snare. Resection and retrieval                            were complete.                           A diminutive polyp was found in the ascending                            colon. The polyp was sessile. The polyp was removed                            with a cold snare. Resection and retrieval were                            complete.                           Two sessile polyps were found in the transverse                            colon. The polyps were 3 to 6 mm in size. These                            polyps were removed with a cold snare. Resection                            and retrieval were complete.                           Internal hemorrhoids were found during retroflexion.                           The exam was otherwise without abnormality. Complications:            No immediate complications. Estimated blood loss:                             Minimal. Estimated Blood Loss:     Estimated blood loss was minimal. Impression:               - Diverticulosis in the entire examined colon.                           - Tortuous colon.                           - Two 2 to 3 mm polyps in the cecum, removed with a                            cold snare. Resected and retrieved.                           - One diminutive polyp in the ascending colon,  removed with a cold snare. Resected and retrieved.                           - Two 3 to 6 mm polyps in the transverse colon,                            removed with a cold snare. Resected and retrieved.                           - Internal hemorrhoids.                           - The examination was otherwise normal. Recommendation:           - Patient has a contact number available for                            emergencies. The signs and symptoms of potential                            delayed complications were discussed with the                            patient. Return to normal activities tomorrow.                            Written discharge instructions were provided to the                            patient.                           - Resume previous diet.                           - Continue present medications.                           - Await pathology results. Remo Lipps P. [name], MD 04/02/2022 11:34:22 AM This report has been signed electronically.

## 2022-04-02 NOTE — Patient Instructions (Signed)
YOU HAD AN ENDOSCOPIC PROCEDURE TODAY AT THE [facility]:   Refer to the procedure report that was given to you for any specific questions about what was found during the examination.  If the procedure report does not answer your questions, please call your gastroenterologist to clarify.  If you requested that your care partner not be given the details of your procedure findings, then the procedure report has been included in a sealed envelope for you to review at your convenience later.  YOU SHOULD EXPECT: Some feelings of bloating in the abdomen. Passage of more gas than usual.  Walking can help get rid of the air that was put into your GI tract during the procedure and reduce the bloating. If you had a lower endoscopy (such as a colonoscopy or flexible sigmoidoscopy) you may notice spotting of blood in your stool or on the toilet paper. If you underwent a bowel prep for your procedure, you may not have a normal bowel movement for a few days.  Please Note:  You might notice some irritation and congestion in your nose or some drainage.  This is from the oxygen used during your procedure.  There is no need for concern and it should clear up in a day or so.  SYMPTOMS TO REPORT IMMEDIATELY:  Following lower endoscopy (colonoscopy or flexible sigmoidoscopy):  Excessive amounts of blood in the stool  Significant tenderness or worsening of abdominal pains  Swelling of the abdomen that is new, acute  Fever of 100F or higher  For urgent or emergent issues, a gastroenterologist can be reached at any hour by calling [phone]. Do not use MyChart messaging for urgent concerns.    DIET:  We do recommend a small meal at first, but then you may proceed to your regular diet.  Drink plenty of fluids but you should avoid alcoholic beverages for 24 hours.  ACTIVITY:  You should plan to take it easy for the rest of today and you should NOT DRIVE or use heavy machinery until tomorrow (because of  the sedation medicines used during the test).    FOLLOW UP: Our staff will call the number listed on your records the next business day following your procedure.  We will call around 7:15- 8:00 am to check on you and address any questions or concerns that you may have regarding the information given to you following your procedure. If we do not reach you, we will leave a message.     If any biopsies were taken you will be contacted by phone or by letter within the next 1-3 weeks.  Please call us at [phone] if you have not heard about the biopsies in 3 weeks.    SIGNATURES/CONFIDENTIALITY: You and/or your care partner have signed paperwork which will be entered into your electronic medical record.  These signatures attest to the fact that that the information above on your After Visit Summary has been reviewed and is understood.  Full responsibility of the confidentiality of this discharge information lies with you and/or your care-partner.  

## 2022-04-02 NOTE — Progress Notes (Signed)
Vss nad trans to pacu °

## 2022-04-03 ENCOUNTER — Telehealth: Payer: Self-pay | Admitting: *Deleted

## 2022-04-03 NOTE — Telephone Encounter (Signed)
Attempted to call patient for their post-procedure follow-up call. No answer. Left voicemail.   

## 2022-04-07 ENCOUNTER — Encounter: Payer: Self-pay | Admitting: Gastroenterology

## 2022-04-15 ENCOUNTER — Ambulatory Visit: Payer: Medicare HMO | Admitting: Dermatology

## 2022-04-16 ENCOUNTER — Telehealth: Payer: Self-pay | Admitting: Family Medicine

## 2022-04-16 DIAGNOSIS — H40052 Ocular hypertension, left eye: Secondary | ICD-10-CM | POA: Diagnosis not present

## 2022-04-16 DIAGNOSIS — E119 Type 2 diabetes mellitus without complications: Secondary | ICD-10-CM | POA: Diagnosis not present

## 2022-04-16 NOTE — Telephone Encounter (Signed)
Called patient to schedule Medicare Annual Wellness Visit (AWV). Left message for patient to call back and schedule Medicare Annual Wellness Visit (AWV).  Last date of AWV: 05/23/2022  Please schedule an appointment at any time with [facility].  If any questions, please contact me at [phone].  Thank you ,  [place] Direct Dial: [phone]

## 2022-04-23 ENCOUNTER — Other Ambulatory Visit: Payer: Self-pay | Admitting: Hematology and Oncology

## 2022-05-06 ENCOUNTER — Ambulatory Visit: Payer: Medicare HMO | Admitting: Dermatology

## 2022-05-06 ENCOUNTER — Other Ambulatory Visit: Payer: Self-pay | Admitting: Dermatology

## 2022-05-06 VITALS — BP 101/61 | HR 70

## 2022-05-06 DIAGNOSIS — L739 Follicular disorder, unspecified: Secondary | ICD-10-CM | POA: Diagnosis not present

## 2022-05-06 DIAGNOSIS — L219 Seborrheic dermatitis, unspecified: Secondary | ICD-10-CM | POA: Diagnosis not present

## 2022-05-06 DIAGNOSIS — L649 Androgenic alopecia, unspecified: Secondary | ICD-10-CM | POA: Diagnosis not present

## 2022-05-06 MED ORDER — MINOXIDIL 2.5 MG PO TABS: ORAL_TABLET | ORAL | 5 refills | Status: DC

## 2022-05-06 MED ORDER — FLUOCINOLONE ACETONIDE BODY 0.01 % EX OIL: TOPICAL_OIL | CUTANEOUS | 5 refills | Status: DC

## 2022-05-06 NOTE — Progress Notes (Signed)
Follow-Up Visit   Subjective  Brandy Guerrero is a 72 y.o. 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 She is taking minoxidil 2.5 mg daily. She has noticed slight improvement, no worsening. No side effects from the minoxidil.  The patient also has seborrheic dermatitis with folliculitis of the scalp. She hasn't used ketoconazole 2% shampoo much, but uses Clindamycin solution and Clobetasol solution as needed.    The following portions of the chart were reviewed this encounter and updated as appropriate: medications, allergies, medical history  Review of Systems:  No other skin or systemic complaints except as noted in HPI or Assessment and Plan.  Objective  Well appearing patient in no apparent distress; mood and affect are within normal limits.  A focused examination was performed of the following areas: Face, scalp  Relevant exam findings are noted in the Assessment and Plan.    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 Condition is symptomatic / bothersome to patient. Not to goal, but improving.  Exam: Round, patchy areas of hair loss crown.  Significant improvement noted when compared to baseline photos with hair regrowth on temporal scalp and crown.    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  In women androgenetic alopecia is commonly associated with menopause but may occur any time after puberty.  It causes hair thinning primarily on the crown with widening of the part and temporal hairline recession.  Can use OTC Rogaine (minoxidil) 5% solution/foam as directed.  Oral treatments in female patients who have no contraindication may include : - Low dose oral minoxidil 1.25 - 5mg  daily - Spironolactone 50 - 100mg  bid - Finasteride 2.5 - 5 mg daily Adjunctive therapies include: - Low Level Laser  Light Therapy (LLLT) - Platelet-rich plasma injections (PRP) - Hair Transplants or scalp reduction   +hx of recent breast cancer, treated and cleared- will not prescribe finasteride without clearance from oncologist Previous labs reviewed, nl Hb, nl TSH Advised to avoid traction, tight braids, etc with hair styling Continue to avoid perms/heat   BP 101/61  Treatment Plan: Continue minoxidil 2.5 mg 1 tablet daily.  Doses of minoxidil for hair loss are considered 'low dose'. This is because the doses used for hair loss are much lower than the doses which are used for conditions such as high blood pressure (hypertension). The doses used for hypertension are 10-40mg  per day.  Side effects are uncommon at the low doses (up to 2.5 mg/day) used to treat hair loss. Potential side effects, more commonly seen at higher doses, include: Increase in hair growth (hypertrichosis) elsewhere on face and body Temporary hair shedding upon starting medication which may last up to 4 weeks Ankle swelling, fluid retention, rapid weight gain more than 5 pounds Low blood pressure and feeling lightheaded or dizzy when standing up quickly Fast or irregular heartbeat Headaches   Patient will discuss with oncologist about starting finasteride 2.5 - 5mg  daily or spironolactone 50-100mg  daily.    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 Currently well-controlled. Still has occasional itching and bumps   Exam: Scalp clear today  Seborrheic Dermatitis is a chronic persistent rash characterized by pinkness and scaling most commonly of the mid face but also can occur on the scalp (dandruff), ears; mid chest, mid back and  groin.  It tends to be exacerbated by stress and cooler weather.  People who have neurologic disease may experience new onset or exacerbation of existing seborrheic dermatitis.  The condition is not curable but  treatable and can be controlled.  Treatment Plan:  Start Derma-Smoothe F/S oil - Apply to scalp QD/BID and leave on dsp 164mL   Continue  Ketoconazole 2% shampoo 1 times a week lather on scalp, leave on 8-10 minutes   Continue Clindamycin solution 1-2 times daily to bumps in scalp as needed   Hold off on Lidex scalp solution (expensive for pt) for now to see if Derma-Smoothe oil helps itch.    Androgenic alopecia  Related Medications minoxidil (LONITEN) 2.5 MG tablet Take 1 tablet once daily    Return in about 6 months (around 11/06/2022) for Alopecia, [facility].  IJamesetta Orleans, CMA, am acting as scribe for Brendolyn Patty, MD .   Documentation: I have reviewed the above documentation for accuracy and completeness, and I agree with the above.  Brendolyn Patty, MD

## 2022-05-06 NOTE — Patient Instructions (Addendum)
Discuss with oncologist about starting finasteride 2.5mg  - 5mg  daily or spironolactone 50mg -100mg  daily.    Due to recent changes in healthcare laws, you may see results of your pathology and/or laboratory studies on MyChart before the doctors have had a chance to review them. We understand that in some cases there may be results that are confusing or concerning to you. Please understand that not all results are received at the same time and often the doctors may need to interpret multiple results in order to provide you with the best plan of care or course of treatment. Therefore, we ask that you please give [place] 2 business days to thoroughly review all your results before contacting the office for clarification. Should we see a critical lab result, you will be contacted sooner.   If You Need Anything After Your Visit  If you have any questions or concerns for your doctor, please call our main line at [phone] and press option 4 to reach your doctor's medical assistant. If no one answers, please leave a voicemail as directed and we will return your call as soon as possible. Messages left after 4 pm will be answered the following business day.   You may also send [place] a message via [place]. We typically respond to MyChart messages within 1-2 business days.  For prescription refills, please ask your pharmacy to contact our office. Our fax number is [phone].  If you have an urgent issue when the clinic is closed that cannot wait until the next business day, you can page your doctor at the number below.    Please note that while we do our best to be available for urgent issues outside of office hours, we are not available 24/7.   If you have an urgent issue and are unable to reach [place], you may choose to seek medical care at your doctor's office, retail clinic, urgent care center, or emergency room.  If you have a medical emergency, please immediately call 911 or go to the emergency  department.  Pager Numbers  - Dr. Nehemiah Massed: [phone]  - Dr. Laurence Ferrari: [phone]  - Dr. Nicole Kindred: [phone]  In the event of inclement weather, please call our main line at [phone] for an update on the status of any delays or closures.  Dermatology Medication Tips: Please keep the boxes that topical medications come in in order to help keep track of the instructions about where and how to use these. Pharmacies typically print the medication instructions only on the boxes and not directly on the medication tubes.   If your medication is too expensive, please contact our office at [phone] option 4 or send [place] a message through [place].   We are unable to tell what your co-pay for medications will be in advance as this is different depending on your insurance coverage. However, we may be able to find a substitute medication at lower cost or fill out paperwork to get insurance to cover a needed medication.   If a prior authorization is required to get your medication covered by your insurance company, please allow [place] 1-2 business days to complete this process.  Drug prices often vary depending on where the prescription is filled and some pharmacies may offer cheaper prices.  The website www.goodrx.com contains coupons for medications through different pharmacies. The prices here do not account for what the cost may be with help from insurance (it may be cheaper with your insurance), but the website can give you the price if you  did not use any insurance.  - You can print the associated coupon and take it with your prescription to the pharmacy.  - You may also stop by our office during regular business hours and pick up a GoodRx coupon card.  - If you need your prescription sent electronically to a different pharmacy, notify our office through [place] or by phone at [phone] option 4.     Si Usted Necesita Algo Despus de Su Visita  Tambin puede enviarnos un  mensaje a travs de Pharmacist, community. Por lo general respondemos a los mensajes de MyChart en el transcurso de 1 a 2 das hbiles.  Para renovar recetas, por favor pida a su farmacia que se ponga en contacto con nuestra oficina. Harland Dingwall de fax es [place] [phone].  Si tiene un asunto urgente cuando la clnica est cerrada y que no puede esperar hasta el siguiente da hbil, puede llamar/localizar a su doctor(a) al nmero que aparece a continuacin.   Por favor, tenga en cuenta que aunque hacemos todo lo posible para estar disponibles para asuntos urgentes fuera del horario de [place], no estamos disponibles las 24 horas del da, los 7 das de la [place].   Si tiene un problema urgente y no puede comunicarse con nosotros, puede optar por buscar atencin mdica  en el consultorio de su doctor(a), en una clnica privada, en un centro de atencin urgente o en una sala de emergencias.  Si tiene Engineering geologist, por favor llame inmediatamente al 911 o vaya a la sala de emergencias.  Nmeros de bper  - Dr. Nehemiah Massed: [phone]  - Dra. Moye: [phone]  - Dra. Nicole Kindred: [phone]  En caso de inclemencias del [place], por favor llame a Johnsie Kindred principal al [phone] para una actualizacin sobre el [place] de cualquier retraso o cierre.  Consejos para la medicacin en dermatologa: Por favor, guarde las cajas en las que vienen los medicamentos de uso tpico para ayudarle a seguir las instrucciones sobre dnde y cmo usarlos. Las farmacias generalmente imprimen las instrucciones del medicamento slo en las cajas y no directamente en los tubos del [place].   Si su medicamento es muy caro, por favor, pngase en contacto con Zigmund Daniel llamando al [phone] y presione la opcin 4 o envenos un mensaje a travs de Pharmacist, community.   No podemos decirle cul ser su copago por los medicamentos por adelantado ya que esto es diferente dependiendo de la cobertura de su seguro. Sin embargo,  es posible que podamos encontrar un medicamento sustituto a Electrical engineer un formulario para que el seguro cubra el medicamento que se considera necesario.   Si se requiere una autorizacin previa para que su compaa de seguros [place] su medicamento, por favor permtanos de 1 a 2 das hbiles para completar este proceso.  Los precios de los medicamentos varan con frecuencia dependiendo del Environmental consultant de dnde se surte la receta y alguna farmacias pueden ofrecer precios ms baratos.  El sitio web www.goodrx.com tiene cupones para medicamentos de Airline pilot. Los precios aqu no tienen en cuenta lo que podra costar con la ayuda del seguro (puede ser ms barato con su seguro), pero el sitio web puede darle el precio si no utiliz Research scientist (physical sciences).  - Puede imprimir el cupn correspondiente y llevarlo con su receta a la farmacia.  - Tambin puede pasar por nuestra oficina durante el horario de atencin regular y Charity fundraiser una tarjeta de cupones de GoodRx.  - Si necesita que su receta se  enve electrnicamente a una farmacia diferente, informe a nuestra oficina a travs de MyChart de [place] o por telfono llamando al [phone] y presione la opcin 4.  

## 2022-05-24 ENCOUNTER — Other Ambulatory Visit: Payer: Self-pay | Admitting: Family Medicine

## 2022-05-24 DIAGNOSIS — E1169 Type 2 diabetes mellitus with other specified complication: Secondary | ICD-10-CM

## 2022-05-24 DIAGNOSIS — R002 Palpitations: Secondary | ICD-10-CM

## 2022-05-27 ENCOUNTER — Other Ambulatory Visit (INDEPENDENT_AMBULATORY_CARE_PROVIDER_SITE_OTHER): Payer: Medicare HMO

## 2022-05-27 DIAGNOSIS — E785 Hyperlipidemia, unspecified: Secondary | ICD-10-CM | POA: Diagnosis not present

## 2022-05-27 DIAGNOSIS — E1169 Type 2 diabetes mellitus with other specified complication: Secondary | ICD-10-CM | POA: Diagnosis not present

## 2022-05-27 DIAGNOSIS — R002 Palpitations: Secondary | ICD-10-CM

## 2022-05-27 LAB — COMPREHENSIVE METABOLIC PANEL
ALT: 11 U/L (ref 0–35)
AST: 12 U/L (ref 0–37)
Albumin: 4.3 g/dL (ref 3.5–5.2)
Alkaline Phosphatase: 148 U/L — ABNORMAL HIGH (ref 39–117)
BUN: 15 mg/dL (ref 6–23)
CO2: 29 mEq/L (ref 19–32)
Calcium: 9.6 mg/dL (ref 8.4–10.5)
Chloride: 102 mEq/L (ref 96–112)
Creatinine, Ser: 0.71 mg/dL (ref 0.40–1.20)
GFR: 86.09 mL/min (ref >60.00)
Glucose, Bld: 155 mg/dL — ABNORMAL HIGH (ref 70–99)
Potassium: 4.3 mEq/L (ref 3.5–5.1)
Sodium: 143 mEq/L (ref 135–145)
Total Bilirubin: 0.9 mg/dL (ref 0.2–1.2)
Total Protein: 7.1 g/dL (ref 6.0–8.3)

## 2022-05-27 LAB — CBC WITH DIFFERENTIAL/PLATELET
Basophils Absolute: 0.1 10*3/uL (ref 0.0–0.1)
Basophils Relative: 0.8 % (ref 0.0–3.0)
Eosinophils Absolute: 0.1 10*3/uL (ref 0.0–0.7)
Eosinophils Relative: 2 % (ref 0.0–5.0)
HCT: 37.6 % (ref 36.0–46.0)
Hemoglobin: 12.3 g/dL (ref 12.0–15.0)
Lymphocytes Relative: 22.5 % (ref 12.0–46.0)
Lymphs Abs: 1.5 10*3/uL (ref 0.7–4.0)
MCHC: 32.7 g/dL (ref 30.0–36.0)
MCV: 91.9 fl (ref 78.0–100.0)
Monocytes Absolute: 0.5 10*3/uL (ref 0.1–1.0)
Monocytes Relative: 7.1 % (ref 3.0–12.0)
Neutro Abs: 4.5 10*3/uL (ref 1.4–7.7)
Neutrophils Relative %: 67.6 % (ref 43.0–77.0)
Platelets: 301 10*3/uL (ref 150.0–400.0)
RBC: 4.09 Mil/uL (ref 3.87–5.11)
RDW: 13.6 % (ref 11.5–15.5)
WBC: 6.6 10*3/uL (ref 4.0–10.5)

## 2022-05-27 LAB — MICROALBUMIN / CREATININE URINE RATIO
Creatinine,U: 111.7 mg/dL
Microalb Creat Ratio: 0.7 mg/g (ref 0.0–30.0)
Microalb, Ur: 0.7 mg/dL (ref 0.0–1.9)

## 2022-05-27 LAB — TSH: TSH: 1.56 u[IU]/mL (ref 0.35–5.50)

## 2022-05-27 LAB — LIPID PANEL
Cholesterol: 177 mg/dL (ref 0–200)
HDL: 51.7 mg/dL (ref >39.00)
LDL Cholesterol: 112 mg/dL — ABNORMAL HIGH (ref 0–99)
NonHDL: 125.39
Total CHOL/HDL Ratio: 3
Triglycerides: 69 mg/dL (ref 0.0–149.0)
VLDL: 13.8 mg/dL (ref 0.0–40.0)

## 2022-05-27 LAB — HEMOGLOBIN A1C: Hgb A1c MFr Bld: 7.3 % — ABNORMAL HIGH (ref 4.6–6.5)

## 2022-05-28 ENCOUNTER — Other Ambulatory Visit: Payer: Medicare HMO

## 2022-06-02 ENCOUNTER — Ambulatory Visit (INDEPENDENT_AMBULATORY_CARE_PROVIDER_SITE_OTHER): Payer: Medicare HMO

## 2022-06-02 VITALS — Ht 63.0 in | Wt 182.0 lb

## 2022-06-02 DIAGNOSIS — Z0000 Encounter for general adult medical examination without abnormal findings: Secondary | ICD-10-CM | POA: Diagnosis not present

## 2022-06-02 NOTE — Patient Instructions (Signed)
Brandy Guerrero , Thank you for taking time to come for your Medicare Wellness Visit. I appreciate your ongoing commitment to your health goals. Please review the following plan we discussed and let me know if I can assist you in the future.   These are the goals we discussed:  Goals   None     This is a list of the screening recommended for you and due dates:  Health Maintenance  Topic Date Due   Zoster (Shingles) Vaccine (1 of 2) Never done   COVID-19 Vaccine (5 - 2023-24 season) 10/11/2021   DTaP/Tdap/Td vaccine (2 - Td or Tdap) 10/20/2021   Eye exam for diabetics  08/27/2022   Flu Shot  09/11/2022   Mammogram  10/03/2022   Hemoglobin A1C  11/26/2022   Complete foot exam   12/05/2022   Yearly kidney function blood test for diabetes  05/27/2023   Yearly kidney health urinalysis for diabetes  05/27/2023   Medicare Annual Wellness Visit  06/02/2023   Colon Cancer Screening  04/02/2025   Pneumonia Vaccine  Completed   DEXA scan (bone density measurement)  Completed   Hepatitis C Screening: USPSTF Recommendation to screen - Ages 26-79 yo.  Completed   HPV Vaccine  Aged Out    Advanced directives: none  Conditions/risks identified: Aim for 30 minutes of exercise or brisk walking, 6-8 glasses of water, and 5 servings of fruits and vegetables each day.   Next appointment: Follow up in one year for your annual wellness visit 06/04/23 @ 11:00 televisit   Preventive Care 65 Years and Older, Female Preventive care refers to lifestyle choices and visits with your health care provider that can promote health and wellness. What does preventive care include? A yearly physical exam. This is also called an annual well check. Dental exams once or twice a year. Routine eye exams. Ask your health care provider how often you should have your eyes checked. Personal lifestyle choices, including: Daily care of your teeth and gums. Regular physical activity. Eating a healthy diet. Avoiding tobacco  and drug use. Limiting alcohol use. Practicing safe sex. Taking low-dose aspirin every day. Taking vitamin and mineral supplements as recommended by your health care provider. What happens during an annual well check? The services and screenings done by your health care provider during your annual well check will depend on your age, overall health, lifestyle risk factors, and family history of disease. Counseling  Your health care provider may ask you questions about your: Alcohol use. Tobacco use. Drug use. Emotional well-being. Home and relationship well-being. Sexual activity. Eating habits. History of falls. Memory and ability to understand (cognition). Work and work Astronomer. Reproductive health. Screening  You may have the following tests or measurements: Height, weight, and BMI. Blood pressure. Lipid and cholesterol levels. These may be checked every 5 years, or more frequently if you are over 55 years old. Skin check. Lung cancer screening. You may have this screening every year starting at age 43 if you have a 30-pack-year history of smoking and currently smoke or have quit within the past 15 years. Fecal occult blood test (FOBT) of the stool. You may have this test every year starting at age 49. Flexible sigmoidoscopy or colonoscopy. You may have a sigmoidoscopy every 5 years or a colonoscopy every 10 years starting at age 39. Hepatitis C blood test. Hepatitis B blood test. Sexually transmitted disease (STD) testing. Diabetes screening. This is done by checking your blood sugar (glucose) after you have not  eaten for a while (fasting). You may have this done every 1-3 years. Bone density scan. This is done to screen for osteoporosis. You may have this done starting at age 71. Mammogram. This may be done every 1-2 years. Talk to your health care provider about how often you should have regular mammograms. Talk with your health care provider about your test results,  treatment options, and if necessary, the need for more tests. Vaccines  Your health care provider may recommend certain vaccines, such as: Influenza vaccine. This is recommended every year. Tetanus, diphtheria, and acellular pertussis (Tdap, Td) vaccine. You may need a Td booster every 10 years. Zoster vaccine. You may need this after age 46. Pneumococcal 13-valent conjugate (PCV13) vaccine. One dose is recommended after age 38. Pneumococcal polysaccharide (PPSV23) vaccine. One dose is recommended after age 13. Talk to your health care provider about which screenings and vaccines you need and how often you need them. This information is not intended to replace advice given to you by your health care provider. Make sure you discuss any questions you have with your health care provider. Document Released: 02/23/2015 Document Revised: 10/17/2015 Document Reviewed: 11/28/2014 Elsevier Interactive Patient Education  2017 [employer].  Fall Prevention in the Home Falls can cause injuries. They can happen to people of all ages. There are many things you can do to make your home safe and to help prevent falls. What can I do on the outside of my home? Regularly fix the edges of walkways and driveways and fix any cracks. Remove anything that might make you trip as you walk through a door, such as a raised step or threshold. Trim any bushes or trees on the path to your home. Use bright outdoor lighting. Clear any walking paths of anything that might make someone trip, such as rocks or tools. Regularly check to see if handrails are loose or broken. Make sure that both sides of any steps have handrails. Any raised decks and porches should have guardrails on the edges. Have any leaves, snow, or ice cleared regularly. Use sand or salt on walking paths during winter. Clean up any spills in your garage right away. This includes oil or grease spills. What can I do in the bathroom? Use night  lights. Install grab bars by the toilet and in the tub and shower. Do not use towel bars as grab bars. Use non-skid mats or decals in the tub or shower. If you need to sit down in the shower, use a plastic, non-slip stool. Keep the floor dry. Clean up any water that spills on the floor as soon as it happens. Remove soap buildup in the tub or shower regularly. Attach bath mats securely with double-sided non-slip rug tape. Do not have throw rugs and other things on the floor that can make you trip. What can I do in the bedroom? Use night lights. Make sure that you have a light by your bed that is easy to reach. Do not use any sheets or blankets that are too big for your bed. They should not hang down onto the floor. Have a firm chair that has side arms. You can use this for support while you get dressed. Do not have throw rugs and other things on the floor that can make you trip. What can I do in the kitchen? Clean up any spills right away. Avoid walking on wet floors. Keep items that you use a lot in easy-to-reach places. If you need to reach  something above you, use a strong step stool that has a grab bar. Keep electrical cords out of the way. Do not use floor polish or wax that makes floors slippery. If you must use wax, use non-skid floor wax. Do not have throw rugs and other things on the floor that can make you trip. What can I do with my stairs? Do not leave any items on the stairs. Make sure that there are handrails on both sides of the stairs and use them. Fix handrails that are broken or loose. Make sure that handrails are as long as the stairways. Check any carpeting to make sure that it is firmly attached to the stairs. Fix any carpet that is loose or worn. Avoid having throw rugs at the top or bottom of the stairs. If you do have throw rugs, attach them to the floor with carpet tape. Make sure that you have a light switch at the top of the stairs and the bottom of the stairs. If  you do not have them, ask someone to add them for you. What else can I do to help prevent falls? Wear shoes that: Do not have high heels. Have rubber bottoms. Are comfortable and fit you well. Are closed at the toe. Do not wear sandals. If you use a stepladder: Make sure that it is fully opened. Do not climb a closed stepladder. Make sure that both sides of the stepladder are locked into place. Ask someone to hold it for you, if possible. Clearly mark and make sure that you can see: Any grab bars or handrails. First and last steps. Where the edge of each step is. Use tools that help you move around (mobility aids) if they are needed. These include: Canes. Walkers. Scooters. Crutches. Turn on the lights when you go into a dark area. Replace any light bulbs as soon as they burn out. Set up your furniture so you have a clear path. Avoid moving your furniture around. If any of your floors are uneven, fix them. If there are any pets around you, be aware of where they are. Review your medicines with your doctor. Some medicines can make you feel dizzy. This can increase your chance of falling. Ask your doctor what other things that you can do to help prevent falls. This information is not intended to replace advice given to you by your health care provider. Make sure you discuss any questions you have with your health care provider. Document Released: 11/23/2008 Document Revised: 07/05/2015 Document Reviewed: 03/03/2014 Elsevier Interactive Patient Education  2017 [employer].

## 2022-06-02 NOTE — Progress Notes (Signed)
I connected with  Brandy Guerrero on 06/02/22 by a audio enabled telemedicine application and verified that I am speaking with the correct person using two identifiers.  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 The patient expressed understanding and agreed to proceed.  Subjective:   Brandy Guerrero is a 72 y.o. female who presents for Medicare Annual (Subsequent) preventive examination.  Review of Systems      Cardiac Risk Factors include: advanced age (>68men, >42 women);diabetes mellitus     Objective:    Today's Vitals   06/02/22 1529  Weight: 182 lb (82.6 kg)  Height:  (1.6 m)   Body mass index is 32.24 kg/m.     06/02/2022    3:33 PM 12/26/2020    9:35 AM 09/21/2020   11:19 AM 03/08/2020   10:25 AM  Advanced Directives  Does Patient Have a Medical Advance Directive? No No No No  Would patient like information on creating a medical advance directive? No - Patient declined No - Patient declined No - Patient declined No - Patient declined    Current Medications (verified) Outpatient Encounter Medications as of 06/02/2022  Medication Sig   atorvastatin (LIPITOR) 20 MG tablet Take 1 tablet (20 mg total) by mouth daily.   Cholecalciferol (VITAMIN D3) 25 MCG (1000 UT) CAPS Take 2 capsules (2,000 Units total) by mouth daily.   clindamycin (CLEOCIN T) 1 % external solution Apply 1-2 times daily to scalp as needed for breakouts   Fluocinolone Acetonide Body 0.01 % OIL APPLY TO SCALP 1-2 TIMES DAILY   fluocinonide (LIDEX) 0.05 % external solution Apply 1-2 drops once or twice daily to affected areas on scalp as needed for itching   ketoconazole (NIZORAL) 2 % shampoo 1-2 times per week lather on scalp, leave on 8-10 minutes, rinse well   latanoprost (XALATAN) 0.005 % ophthalmic solution Place 1 drop into the left eye at bedtime.   letrozole (FEMARA) 2.5 MG tablet Take 1 tablet  by mouth once daily   metFORMIN (GLUCOPHAGE) 500 MG tablet Take 1 tablet (500 mg total) by mouth in the morning, at noon, and at bedtime.   metoprolol tartrate (LOPRESSOR) 25 MG tablet Take 1 tablet (25 mg total) by mouth 2 (two) times daily as needed (heart fluttering).   minoxidil (LONITEN) 2.5 MG tablet Take 1 tablet once daily   vitamin E 180 MG (400 UNITS) capsule Take 400 Units by mouth daily. Take two by mouth daily   empagliflozin (JARDIANCE) 10 MG TABS tablet Take 1 tablet (10 mg total) by mouth daily before breakfast. (Patient not taking: Reported on 06/02/2022)   Facility-Administered Encounter Medications as of 06/02/2022  Medication   0.9 %  sodium chloride infusion    Allergies (verified) Sulfa antibiotics   History: Past Medical History:  Diagnosis Date   Acne    dermatology - on doxy  daily   Anemia    Breast cancer    Bursitis of right shoulder    History of atrial fibrillation 05/11/2005   h/o parox Afib, nl cardiolite/echo, no prolonged episodes.  metoprolol prn Tresa Endo)   HLD (hyperlipidemia)    Obesity    Panic attacks    Positive occult stool blood test 02/11/2012   s/p normal colonoscopy, rpt stool cards negative   Past Surgical History:  Procedure Laterality Date   ABDOMINAL HYSTERECTOMY  1990s   for fibroids, has one ovary remaining ([place]).  partial   BREAST BIOPSY     BREAST LUMPECTOMY WITH RADIOACTIVE SEED AND SENTINEL LYMPH NODE BIOPSY Left 09/26/2020   Procedure: LEFT BREAST LUMPECTOMY WITH RADIOACTIVE SEED AND SENTINEL LYMPH NODE BIOPSY;  Surgeon: Chevis Pretty III, MD;  Location: [facility] OR;  Service: General;  Laterality: Left;   COLONOSCOPY  01/2012   mod diverticulosis, rec rpt stool cards and if + rec EGD (returned negative), rpt colonoscopy 10 yrs   Family History  Problem Relation Age of Onset   Hypertension Mother    Alcohol abuse Father    Alcohol abuse Brother    Diabetes Daughter    Cancer Neg Hx    Coronary artery disease Neg Hx     Stroke Neg Hx    Colon cancer Neg Hx    Colon polyps Neg Hx    Esophageal cancer Neg Hx    Rectal cancer Neg Hx    Stomach cancer Neg Hx    Social History   Socioeconomic History   Marital status: Married    Spouse name: Not on file   Number of children: Not on file   Years of education: Not on file   Highest education level: Not on file  Occupational History   Not on file  Tobacco Use   Smoking status: Never   Smokeless tobacco: Never  Vaping Use   Vaping Use: Never used  Substance and Sexual Activity   Alcohol use: Yes    Alcohol/week: 0.0 standard drinks of alcohol    Comment: Rare   Drug use: No   Sexual activity: Yes    Birth control/protection: Surgical    Comment: hysterectomy  Other Topics Concern   Not on file  Social History Narrative   Caffeine: none    Lives with husband, has grown children, outside pets (husband is Product manager)    Occupation: CNA at [employer], works 2nd shift, [employer] on weekends    Activity: starting regular walking - 3 mi about 2d/wk    Diet: good water, fruits/vegetables daily    Social Determinants of Health   Financial Resource Strain: Low Risk  (06/02/2022)   Overall Financial Resource Strain (CARDIA)    Difficulty of Paying Living Expenses: Not hard at all  Food Insecurity: No Food Insecurity (06/02/2022)   Hunger Vital Sign    Worried About Running Out of Food in the Last Year: Never true    Ran Out of Food in the Last Year: Never true  Transportation Needs: No Transportation Needs (06/02/2022)   PRAPARE - Administrator, Civil Service (Medical): No    Lack of Transportation (Non-Medical): No  Physical Activity: Inactive (06/02/2022)   Exercise Vital Sign    Days of Exercise per Week: 0 days    Minutes of Exercise per Session: 0 min  Stress: No Stress Concern Present (06/02/2022)   [employer] of Occupational Health - Occupational Stress Questionnaire    Feeling of Stress : Not at all  Social  Connections: Moderately Isolated (06/02/2022)   Social Connection and Isolation Panel [NHANES]    Frequency of Communication with Friends and Family: More than three times a week    Frequency of Social Gatherings with Friends and Family: Once a week    Attends Religious Services: Never    Database administrator or Organizations: No    Attends Banker Meetings: Never    Marital Status: Married    Tobacco Counseling Counseling given: Not Answered   Clinical  Intake:  Pre-visit preparation completed: Yes  Pain : No/denies pain     Nutritional Risks: None Diabetes: Yes CBG done?: No Did pt. bring in CBG monitor from home?: No  How often do you need to have someone help you when you read instructions, pamphlets, or other written materials from your doctor or pharmacy?: 1 - Never  Diabetic? Nutrition Risk Assessment:  Has the patient had any N/V/D within the last 2 months?  No  Does the patient have any non-healing wounds?  No  Has the patient had any unintentional weight loss or weight gain?  No   Diabetes:  Is the patient diabetic?  Yes  If diabetic, was a CBG obtained today?  No  Did the patient bring in their glucometer from home?  No  How often do you monitor your CBG's? Pt does not check.   Financial Strains and Diabetes Management:  Are you having any financial strains with the device, your supplies or your medication? No .  Does the patient want to be seen by Chronic Care Management for management of their diabetes?  No  Would the patient like to be referred to a Nutritionist or for Diabetic Management?  No   Diabetic Exams:  Diabetic Eye Exam: Completed 08/26/21 [facility] Diabetic Foot Exam: Completed 12/04/21    Interpreter Needed?: No  Information entered by :: C.Abdalla Naramore LPN   Activities of Daily Living    06/02/2022    3:34 PM  In your present state of health, do you have any difficulty performing the following activities:  Hearing? 0   Vision? 0  Difficulty concentrating or making decisions? 0  Walking or climbing stairs? 0  Dressing or bathing? 0  Doing errands, shopping? 0  Preparing Food and eating ? N  Using the Toilet? N  In the past six months, have you accidently leaked urine? N  Do you have problems with loss of bowel control? N  Managing your Medications? N  Managing your Finances? N  Housekeeping or managing your Housekeeping? N    Patient Care Team: Eustaquio Boyden, MD as PCP - General (Family Medicine) Griselda Miner, MD as Consulting Physician (General Surgery) Serena Croissant, MD as Consulting Physician (Hematology and Oncology) Carmina Miller, MD as Consulting Physician (Radiation Oncology) Salvatore Marvel, MD as Consulting Physician (Orthopedic Surgery)  Indicate any recent Medical Services you may have received from other than [facility] providers in the past year (date may be approximate).     Assessment:   This is a routine wellness examination for Lamont.  Hearing/Vision screen Hearing Screening - Comments:: No aids Vision Screening - Comments:: No glasses - [facility]  Dietary issues and exercise activities discussed: Current Exercise Habits: The patient does not participate in regular exercise at present;The patient has a physically strenuous job, but has no regular exercise apart from work., Exercise limited by: None identified   Goals Addressed   None    Depression Screen    06/02/2022    3:44 PM 05/22/2021   10:20 AM 04/23/2020    4:10 PM 12/27/2018   11:17 AM 10/28/2017   10:54 AM 10/27/2016    9:33 AM  PHQ 2/9 Scores  PHQ - 2 Score 0 0 PHQ- 9 Score   Fall Risk    06/02/2022    3:34 PM 05/22/2021   10:19 AM 04/23/2020    3:02 PM 09/22/2018    9:30 AM 04/28/2017  2:08 PM  Fall Risk   Falls in the past year? 1 0 0 0 No  Number falls in past yr: 0      Comment tripped outside      Injury with Fall? 0      Risk for fall due to : No Fall Risks       Follow up Falls prevention discussed;Falls evaluation completed   Falls evaluation completed     FALL RISK PREVENTION PERTAINING TO THE HOME:  Any stairs in or around the home? No  If so, are there any without handrails? No  Home free of loose throw rugs in walkways, pet beds, electrical cords, etc? Yes  Adequate lighting in your home to reduce risk of falls? Yes   ASSISTIVE DEVICES UTILIZED TO PREVENT FALLS:  Life alert? No  Use of a cane, walker or w/c? No  Grab bars in the bathroom? No  Shower chair or bench in shower? No  Elevated toilet seat or a handicapped toilet? No   Cognitive Function:        06/02/2022    3:35 PM  6CIT Screen  What Year? 0 points  What month? 0 points  What time? 0 points  Count back from 20 0 points  Months in reverse 0 points  Repeat phrase 2 points  Total Score 2 points    Immunizations Immunization History  Administered Date(s) Administered   Fluad Quad(high Dose 65+) 12/13/2020   Influenza, High Dose Seasonal PF 12/26/2017, 03/19/2020   Influenza-Unspecified 11/10/2012, 11/10/2013, 10/12/2015, 12/09/2021   Moderna Covid-19 Vaccine Bivalent Booster 32yrs & up 12/10/2020   Moderna Sars-Covid-2 Vaccination 03/16/2019, 04/13/2019, 12/29/2019   PPD Test 12/01/2018, 12/22/2018   Pneumococcal Conjugate-13 04/28/2017   Pneumococcal Polysaccharide-23 12/27/2018   Tdap 10/21/2011    TDAP status: Due, Education has been provided regarding the importance of this vaccine. Advised may receive this vaccine at local pharmacy or Health Dept. Aware to provide a copy of the vaccination record if obtained from local pharmacy or Health Dept. Verbalized acceptance and understanding.  Flu Vaccine status: Up to date  Pneumococcal vaccine status: Up to date  Covid-19 vaccine status: Information provided on how to obtain vaccines.   Qualifies for Shingles Vaccine? Yes   Zostavax completed No   Shingrix Completed?: No.    Education has been provided  regarding the importance of this vaccine. Patient has been advised to call insurance company to determine out of pocket expense if they have not yet received this vaccine. Advised may also receive vaccine at local pharmacy or Health Dept. Verbalized acceptance and understanding.  Screening Tests Health Maintenance  Topic Date Due   Zoster Vaccines- Shingrix (1 of 2) Never done   COVID-19 Vaccine (5 - 2023-24 season) 10/11/2021   DTaP/Tdap/Td (2 - Td or Tdap) 10/20/2021   OPHTHALMOLOGY EXAM  08/27/2022   INFLUENZA VACCINE  09/11/2022   MAMMOGRAM  10/03/2022   HEMOGLOBIN A1C  11/26/2022   FOOT EXAM  12/05/2022   Diabetic kidney evaluation - eGFR measurement  05/27/2023   Diabetic kidney evaluation - Urine ACR  05/27/2023   Medicare Annual Wellness (AWV)  06/02/2023   COLONOSCOPY (Pts 45-43yrs Insurance coverage will need to be confirmed)  04/02/2025   Pneumonia Vaccine 19+ Years old  Completed   DEXA SCAN  Completed   Hepatitis C Screening  Completed   HPV VACCINES  Aged Out    Health Maintenance  Health Maintenance Due  Topic Date Due   Zoster Vaccines- Shingrix (  1 of 2) Never done   COVID-19 Vaccine (5 - 2023-24 season) 10/11/2021   DTaP/Tdap/Td (2 - Td or Tdap) 10/20/2021    Colorectal cancer screening: Type of screening: Colonoscopy. Completed 04/02/2022. Repeat every 3 years  Mammogram status: Completed 10/02/2021. Repeat every year  Bone Density status: Completed 07/24/2021. Results reflect: Bone density results: NORMAL. Repeat every 5 years.  Lung Cancer Screening: (Low Dose CT Chest recommended if Age 6-80 years, 30 pack-year currently smoking OR have quit w/in 15years.) does not qualify.   Lung Cancer Screening Referral: no  Additional Screening:  Hepatitis C Screening: does qualify; Completed 10/04/2015  Vision Screening: Recommended annual ophthalmology exams for early detection of glaucoma and other disorders of the eye. Is the patient up to date with their  annual eye exam?  Yes  Who is the provider or what is the name of the office in which the patient attends annual eye exams? [facility] If pt is not established with a provider, would they like to be referred to a provider to establish care? No .   Dental Screening: Recommended annual dental exams for proper oral hygiene  Community Resource Referral / Chronic Care Management: CRR required this visit?  No   CCM required this visit?  No      Plan:     I have personally reviewed and noted the following in the patient's chart:   Medical and social history Use of alcohol, tobacco or illicit drugs  Current medications and supplements including opioid prescriptions. Patient is not currently taking opioid prescriptions. Functional ability and status Nutritional status Physical activity Advanced directives List of other physicians Hospitalizations, surgeries, and ER visits in previous 12 months Vitals Screenings to include cognitive, depression, and falls Referrals and appointments  In addition, I have reviewed and discussed with patient certain preventive protocols, quality metrics, and best practice recommendations. A written personalized care plan for preventive services as well as general preventive health recommendations were provided to patient.     Maryan Puls, LPN   03/12/8655   Nurse Notes: none

## 2022-06-03 ENCOUNTER — Ambulatory Visit: Payer: Medicare HMO

## 2022-06-06 ENCOUNTER — Ambulatory Visit (INDEPENDENT_AMBULATORY_CARE_PROVIDER_SITE_OTHER): Payer: Medicare HMO | Admitting: Family Medicine

## 2022-06-06 ENCOUNTER — Encounter: Payer: Self-pay | Admitting: Family Medicine

## 2022-06-06 VITALS — BP 126/70 | HR 75 | Temp 97.4°F | Ht 62.5 in | Wt 181.5 lb

## 2022-06-06 DIAGNOSIS — Z7189 Other specified counseling: Secondary | ICD-10-CM | POA: Diagnosis not present

## 2022-06-06 DIAGNOSIS — R002 Palpitations: Secondary | ICD-10-CM

## 2022-06-06 DIAGNOSIS — Z170 Estrogen receptor positive status [ER+]: Secondary | ICD-10-CM

## 2022-06-06 DIAGNOSIS — R1900 Intra-abdominal and pelvic swelling, mass and lump, unspecified site: Secondary | ICD-10-CM | POA: Diagnosis not present

## 2022-06-06 DIAGNOSIS — Z7984 Long term (current) use of oral hypoglycemic drugs: Secondary | ICD-10-CM

## 2022-06-06 DIAGNOSIS — E785 Hyperlipidemia, unspecified: Secondary | ICD-10-CM | POA: Diagnosis not present

## 2022-06-06 DIAGNOSIS — E1169 Type 2 diabetes mellitus with other specified complication: Secondary | ICD-10-CM

## 2022-06-06 DIAGNOSIS — C50412 Malignant neoplasm of upper-outer quadrant of left female breast: Secondary | ICD-10-CM | POA: Diagnosis not present

## 2022-06-06 DIAGNOSIS — E669 Obesity, unspecified: Secondary | ICD-10-CM | POA: Diagnosis not present

## 2022-06-06 DIAGNOSIS — Z0000 Encounter for general adult medical examination without abnormal findings: Secondary | ICD-10-CM | POA: Diagnosis not present

## 2022-06-06 LAB — POC URINALSYSI DIPSTICK (AUTOMATED)
Bilirubin, UA: NEGATIVE
Blood, UA: NEGATIVE
Glucose, UA: NEGATIVE
Ketones, UA: NEGATIVE
Leukocytes, UA: NEGATIVE
Nitrite, UA: NEGATIVE
Protein, UA: NEGATIVE
Spec Grav, UA: 1.02 (ref 1.010–1.025)
Urobilinogen, UA: 0.2 E.U./dL
pH, UA: 5.5 (ref 5.0–8.0)

## 2022-06-06 MED ORDER — ATORVASTATIN CALCIUM 40 MG PO TABS: 40.0000 mg | ORAL_TABLET | Freq: Every day | ORAL | 4 refills | Status: DC

## 2022-06-06 MED ORDER — PIOGLITAZONE HCL 15 MG PO TABS
15.0000 mg | ORAL_TABLET | Freq: Every day | ORAL | 6 refills | Status: DC
Start: 2022-06-06 — End: 2023-04-17

## 2022-06-06 MED ORDER — METFORMIN HCL 500 MG PO TABS: 500.0000 mg | ORAL_TABLET | Freq: Two times a day (BID) | ORAL | 4 refills | Status: DC

## 2022-06-06 MED ORDER — ATORVASTATIN CALCIUM 20 MG PO TABS: 20.0000 mg | ORAL_TABLET | Freq: Every day | ORAL | 4 refills | Status: DC

## 2022-06-06 MED ORDER — METOPROLOL TARTRATE 25 MG PO TABS: 25.0000 mg | ORAL_TABLET | Freq: Two times a day (BID) | ORAL | 1 refills | Status: DC | PRN

## 2022-06-06 NOTE — Patient Instructions (Addendum)
Consider mineral sunscreen use.  Stay off jardiance. Trial actos 15mg  daily in addition to metformin 500mg  twice daily.  Urinalysis today.  Good to see you today Return in 6 months for diabetes follow up visit.

## 2022-06-06 NOTE — Assessment & Plan Note (Signed)
Preventative protocols reviewed and updated unless pt declined. Discussed healthy diet and lifestyle.  

## 2022-06-06 NOTE — Assessment & Plan Note (Signed)
Advanced planning - does not have set up. Doesn't know who HCPOA would be - probably daughter Brandy Guerrero, but ideally husband and 2 daughters together. Packet provided previously and again today. Would want to be full code but no prolonged care if terminal condition.

## 2022-06-06 NOTE — Assessment & Plan Note (Signed)
Encouraged healthy diet and lifestyle choices to affect sustainable weight loss.  ?

## 2022-06-06 NOTE — Assessment & Plan Note (Addendum)
Chronic, stable period on atorvastatin, LDL above goal - will increase atorvastatin to 40mg  daily The 10-year ASCVD risk score (Arnett DK, et al., 2019) is: 22.6%   Values used to calculate the score:     Age: 72 years     Sex: Female     Is Non-Hispanic African American: Yes     Diabetic: Yes     Tobacco smoker: No     Systolic Blood Pressure: 126 mmHg     Is BP treated: No     HDL Cholesterol: 51.7 mg/dL     Total Cholesterol: 177 mg/dL

## 2022-06-06 NOTE — Progress Notes (Signed)
Ph: [phone]       Fax: [phone]   Patient ID: Brandy Guerrero, female    DOB: Sep 12, 1950, 72 y.o.   MRN: [id number]  This visit was conducted in person.  BP 126/70   Pulse 75   Temp (!) 97.4 F (36.3 C) (Temporal)   Ht 5' 2.5" (1.588 m)   Wt 181 lb 8 oz (82.3 kg)   SpO2 96%   BMI 32.67 kg/m    CC: CPE Subjective:   HPI: Brandy Guerrero is a 72 y.o. female presenting on 06/06/2022 for Annual Exam (MCR prt 2 [AWV- 06/02/22].  Wants to discuss Jardiance.)   Saw health advisor 05/2022 for medicare wellness visit. Note reviewed.   No results found.  Flowsheet Row Clinical Support from 06/02/2022 in [facility] [place]  PHQ-2 Total Score 0          06/02/2022    3:34 PM 05/22/2021   10:19 AM 04/23/2020    3:02 PM 09/22/2018    9:30 AM 04/28/2017    2:08 PM  Fall Risk   Falls in the past year? 1 0 0 0 No  Number falls in past yr: 0      Comment tripped outside      Injury with Fall? 0      Risk for fall due to : No Fall Risks      Follow up Falls prevention discussed;Falls evaluation completed   Falls evaluation completed   Fall at home while getting out of car - no injury.   More tired recently - caring for mother and husband.   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 Now on Femara, planned for 5 yrs.   DM - she continues metformin 500mg  BID along with jardiance 10mg  daily - was expensive, caused UTI treated 12/2021 with diflucan. She stopped jardiance 12/2021. Wants to avoid injectables.   Rpt pelvic US 12/2021 showed persistence of solid mass to midline/left pelvis (9x8.7x7.8cm)   Preventative: Colonoscopy 03/2022 - 5 TAs, diverticulosis, rpt 3 yrs ([facility]) [facility] at [facility] - s/p TAH for fibroids 1970s, one ovary remains.  Mammogram 09/2021 - [facility] DEXA 07/2021 T score 0.3  LFN Lung cancer screening - not eligible  Flu shot yearly  COVID vaccine Moderna 03/2019, 04/2019, booster 12/2019, bivalent 11/2020 Prevnar-13 - 04/2017, pneumovax 12/2018 Tdap 2013  RSV - discussed, declined Shingrix - discussed, declined Advanced planning - does not have set up. Doesn't know who HCPOA would be - probably daughter Ladell Pier, but ideally husband and 2 daughters together. Packet provided previously and again today. Would want to be full code but no prolonged care if terminal condition.  Seat belt use discussed.  Sunscreen use discussed. No changing moles on skin.  Non smoker  Alcohol - rare  Dentist Q6 mo  Eye exam - yearly  Bowel - no constipation  Bladder - no incontinence   Caffeine: none   Lives with husband, has grown children, outside pets (husband is Product manager) Occupation: CNA but currently working at [employer] Activity: starting regular walking - 3 mi about 2d/wk   Diet: good water, fruits/vegetables daily      Relevant past medical, surgical, family and social history reviewed and updated as indicated. Interim medical history since our last visit reviewed. Allergies and medications reviewed and  updated. Outpatient Medications Prior to Visit  Medication Sig Dispense Refill   Cholecalciferol (VITAMIN D3) 25 MCG (1000 UT) CAPS Take 2 capsules (2,000 Units total) by mouth daily. 30 capsule    clindamycin (CLEOCIN T) 1 % external solution Apply 1-2 times daily to scalp as needed for breakouts 60 mL 5   Fluocinolone Acetonide Body 0.01 % OIL APPLY TO SCALP 1-2 TIMES DAILY 119 mL 5   fluocinonide (LIDEX) 0.05 % external solution Apply 1-2 drops once or twice daily to affected areas on scalp as needed for itching 60 mL 2   ketoconazole (NIZORAL) 2 % shampoo 1-2 times per week lather on scalp, leave on 8-10 minutes, rinse well 120 mL 5   latanoprost (XALATAN) 0.005 % ophthalmic solution Place 1 drop into the left eye at bedtime.     letrozole (FEMARA) 2.5  MG tablet Take 1 tablet by mouth once daily 90 tablet 0   minoxidil (LONITEN) 2.5 MG tablet Take 1 tablet once daily 30 tablet 5   vitamin E 180 MG (400 UNITS) capsule Take 400 Units by mouth daily. Take two by mouth daily     atorvastatin (LIPITOR) 20 MG tablet Take 1 tablet (20 mg total) by mouth daily. 90 tablet 3   metFORMIN (GLUCOPHAGE) 500 MG tablet Take 1 tablet (500 mg total) by mouth in the morning, at noon, and at bedtime. 270 tablet 3   metoprolol tartrate (LOPRESSOR) 25 MG tablet Take 1 tablet (25 mg total) by mouth 2 (two) times daily as needed (heart fluttering). 30 tablet 1   empagliflozin (JARDIANCE) 10 MG TABS tablet Take 1 tablet (10 mg total) by mouth daily before breakfast. (Patient not taking: Reported on 06/06/2022) 30 tablet 6   0.9 %  sodium chloride infusion      No facility-administered medications prior to visit.     Per HPI unless specifically indicated in ROS section below Review of Systems  Constitutional:  Negative for activity change, appetite change, chills, fatigue, fever and unexpected weight change.  HENT:  Negative for hearing loss.   Eyes:  Negative for visual disturbance.  Respiratory:  Negative for cough, chest tightness, shortness of breath and wheezing.   Cardiovascular:  Negative for chest pain, palpitations and leg swelling.  Gastrointestinal:  Positive for diarrhea (metformin related). Negative for abdominal distention, abdominal pain, blood in stool, constipation, nausea and vomiting.  Genitourinary:  Negative for difficulty urinating and hematuria.  Musculoskeletal:  Negative for arthralgias, myalgias and neck pain.  Skin:  Negative for rash.  Neurological:  Negative for dizziness, seizures, syncope and headaches.  Hematological:  Negative for adenopathy. Does not bruise/bleed easily.  Psychiatric/Behavioral:  Negative for dysphoric mood. The patient is not nervous/anxious.     Objective:  BP 126/70   Pulse 75   Temp (!) 97.4 F (36.3 C)  (Temporal)   Ht 5' 2.5" (1.588 m)   Wt 181 lb 8 oz (82.3 kg)   SpO2 96%   BMI 32.67 kg/m   Wt Readings from Last 3 Encounters:  06/06/22 181 lb 8 oz (82.3 kg)  06/02/22 182 lb (82.6 kg)  04/02/22 180 lb (81.6 kg)      Physical Exam Vitals and nursing note reviewed.  Constitutional:      Appearance: Normal appearance. She is not ill-appearing.  HENT:     Head: Normocephalic and atraumatic.     Right Ear: Tympanic membrane, ear canal and external ear normal. There is no impacted cerumen.     Left  Ear: Tympanic membrane, ear canal and external ear normal. There is no impacted cerumen.     Nose: Nose normal.     Mouth/Throat:     Mouth: Mucous membranes are moist.     Pharynx: Oropharynx is clear. No oropharyngeal exudate or posterior oropharyngeal erythema.  Eyes:     General:        Right eye: No discharge.        Left eye: No discharge.     Extraocular Movements: Extraocular movements intact.     Conjunctiva/sclera: Conjunctivae normal.     Pupils: Pupils are equal, round, and reactive to light.  Neck:     Thyroid: No thyroid mass or thyromegaly.     Vascular: No carotid bruit.  Cardiovascular:     Rate and Rhythm: Normal rate and regular rhythm.     Pulses: Normal pulses.     Heart sounds: Normal heart sounds. No murmur heard. Pulmonary:     Effort: Pulmonary effort is normal. No respiratory distress.     Breath sounds: Normal breath sounds. No wheezing, rhonchi or rales.  Abdominal:     General: Bowel sounds are normal. There is no distension.     Palpations: Abdomen is soft. There is no mass.     Tenderness: There is no abdominal tenderness. There is no guarding or rebound.     Hernia: No hernia is present.  Musculoskeletal:     Cervical back: Normal range of motion and neck supple. No rigidity.     Right lower leg: No edema.     Left lower leg: No edema.  Lymphadenopathy:     Cervical: No cervical adenopathy.  Skin:    General: Skin is warm and dry.      Findings: No rash.  Neurological:     General: No focal deficit present.     Mental Status: She is alert. Mental status is at baseline.  Psychiatric:        Mood and Affect: Mood normal.        Behavior: Behavior normal.       Results for orders placed or performed in visit on 05/27/22  TSH  Result Value Ref Range   TSH 1.56 0.35 - 5.50 uIU/mL  CBC with Differential/Platelet  Result Value Ref Range   WBC 6.6 4.0 - 10.5 K/uL   RBC 4.09 3.87 - 5.11 Mil/uL   Hemoglobin 12.3 12.0 - 15.0 g/dL   HCT 16.1 09.6 - 04.5 %   MCV 91.9 78.0 - 100.0 fl   MCHC 32.7 30.0 - 36.0 g/dL   RDW 40.9 81.1 - 91.4 %   Platelets 301.0 150.0 - 400.0 K/uL   Neutrophils Relative % 67.6 43.0 - 77.0 %   Lymphocytes Relative 22.5 12.0 - 46.0 %   Monocytes Relative 7.1 3.0 - 12.0 %   Eosinophils Relative 2.0 0.0 - 5.0 %   Basophils Relative 0.8 0.0 - 3.0 %   Neutro Abs 4.5 1.4 - 7.7 K/uL   Lymphs Abs 1.5 0.7 - 4.0 K/uL   Monocytes Absolute 0.5 0.1 - 1.0 K/uL   Eosinophils Absolute 0.1 0.0 - 0.7 K/uL   Basophils Absolute 0.1 0.0 - 0.1 K/uL  Comprehensive metabolic panel  Result Value Ref Range   Sodium 143 135 - 145 mEq/L   Potassium 4.3 3.5 - 5.1 mEq/L   Chloride 102 96 - 112 mEq/L   CO2 29 19 - 32 mEq/L   Glucose, Bld 155 (H) 70 - 99 mg/dL  BUN 15 6 - 23 mg/dL   Creatinine, Ser 4.54 0.40 - 1.20 mg/dL   Total Bilirubin 0.9 0.2 - 1.2 mg/dL   Alkaline Phosphatase 148 (H) 39 - 117 U/L   AST 12 0 - 37 U/L   ALT 11 0 - 35 U/L   Total Protein 7.1 6.0 - 8.3 g/dL   Albumin 4.3 3.5 - 5.2 g/dL   GFR 09.81 >19.14 mL/min   Calcium 9.6 8.4 - 10.5 mg/dL  Lipid panel  Result Value Ref Range   Cholesterol 177 0 - 200 mg/dL   Triglycerides 78.2 0.0 - 149.0 mg/dL   HDL 95.62 >13.08 mg/dL   VLDL 65.7 0.0 - 84.6 mg/dL   LDL Cholesterol 962 (H) 0 - 99 mg/dL   Total CHOL/HDL Ratio 3    NonHDL 125.39   Microalbumin / creatinine urine ratio  Result Value Ref Range   Microalb, Ur 0.7 0.0 - 1.9 mg/dL    Creatinine,U 952.8 mg/dL   Microalb Creat Ratio 0.7 0.0 - 30.0 mg/g  Hemoglobin A1c  Result Value Ref Range   Hgb A1c MFr Bld 7.3 (H) 4.6 - 6.5 %    Assessment & Plan:   Problem List Items Addressed This Visit     Hyperlipidemia associated with type 2 diabetes mellitus (HCC)    Chronic, stable period on atorvastatin, LDL above goal - will increase atorvastatin to 40mg  daily The 10-year ASCVD risk score (Arnett DK, et al., 2019) is: 22.6%   Values used to calculate the score:     Age: 50 years     Sex: Female     Is Non-Hispanic African American: Yes     Diabetic: Yes     Tobacco smoker: No     Systolic Blood Pressure: 126 mmHg     Is BP treated: No     HDL Cholesterol: 51.7 mg/dL     Total Cholesterol: 177 mg/dL       Relevant Medications   metFORMIN (GLUCOPHAGE) 500 MG tablet   metoprolol tartrate (LOPRESSOR) 25 MG tablet   pioglitazone (ACTOS) 15 MG tablet   atorvastatin (LIPITOR) 40 MG tablet   Pelvic mass in female    Stable measurements on latest pelvic US by GYN - pt declines further evaluation.       Palpitations   Relevant Medications   metoprolol tartrate (LOPRESSOR) 25 MG tablet   Obesity, Class I, BMI 30-34.9    Encouraged healthy diet and lifestyle choices to affect sustainable weight loss.       Health maintenance examination - Primary (Chronic)    Preventative protocols reviewed and updated unless pt declined. Discussed healthy diet and lifestyle.       Type 2 diabetes mellitus with other specified complication (HCC)    Chronic, continued improvement even despite stopping jardiance due to frequent UTI/yeast infection. Will continue metformin IR 500mg  BID, add low dose actos 15mg  daily, recheck at 6 mo DM f/u visit. Check baseline UA. Reviewed possible risk of bladder cancer at high doses with prolonged use. Advised watch for leg swelling side effect. No known h/o CHF      Relevant Medications   metFORMIN (GLUCOPHAGE) 500 MG tablet   pioglitazone  (ACTOS) 15 MG tablet   atorvastatin (LIPITOR) 40 MG tablet   Other Relevant Orders   POCT Urinalysis Dipstick (Automated)   Advanced care planning/counseling discussion (Chronic)    Advanced planning - does not have set up. Doesn't know who HCPOA would be - probably daughter Ladell Pier,  but ideally husband and 2 daughters together. Packet provided previously and again today. Would want to be full code but no prolonged care if terminal condition.       Malignant neoplasm of upper-outer quadrant of left breast in female, estrogen receptor positive [facility])    Seeing oncology Dr Pamelia Hoit regularly plan for femara x5 yrs.         Meds ordered this encounter  Medications   DISCONTD: atorvastatin (LIPITOR) 20 MG tablet    Sig: Take 1 tablet (20 mg total) by mouth daily.    Dispense:  90 tablet    Refill:  4   metFORMIN (GLUCOPHAGE) 500 MG tablet    Sig: Take 1 tablet (500 mg total) by mouth 2 (two) times daily with a meal.    Dispense:  180 tablet    Refill:  4   metoprolol tartrate (LOPRESSOR) 25 MG tablet    Sig: Take 1 tablet (25 mg total) by mouth 2 (two) times daily as needed (heart fluttering).    Dispense:  60 tablet    Refill:  1   pioglitazone (ACTOS) 15 MG tablet    Sig: Take 1 tablet (15 mg total) by mouth daily.    Dispense:  30 tablet    Refill:  6   atorvastatin (LIPITOR) 40 MG tablet    Sig: Take 1 tablet (40 mg total) by mouth daily.    Dispense:  90 tablet    Refill:  4    Use this dose    Orders Placed This Encounter  Procedures   POCT Urinalysis Dipstick (Automated)    Patient Instructions  Consider mineral sunscreen use.  Stay off jardiance. Trial actos 15mg  daily in addition to metformin 500mg  twice daily.  Urinalysis today.  Good to see you today Return in 6 months for diabetes follow up visit.   Follow up plan: Return in about 6 months (around 12/06/2022) for follow up visit.  Eustaquio Boyden, MD

## 2022-06-06 NOTE — Assessment & Plan Note (Addendum)
Stable measurements on latest pelvic US by GYN - pt declines further evaluation.

## 2022-06-06 NOTE — Assessment & Plan Note (Addendum)
Chronic, continued improvement even despite stopping jardiance due to frequent UTI/yeast infection. Will continue metformin IR 500mg  BID, add low dose actos 15mg  daily, recheck at 6 mo DM f/u visit. Check baseline UA. Reviewed possible risk of bladder cancer at high doses with prolonged use. Advised watch for leg swelling side effect. No known h/o CHF

## 2022-06-06 NOTE — Assessment & Plan Note (Addendum)
Seeing oncology Dr Pamelia Hoit regularly plan for femara x5 yrs.

## 2022-08-13 ENCOUNTER — Other Ambulatory Visit: Payer: Self-pay | Admitting: Hematology and Oncology

## 2022-08-20 DIAGNOSIS — E119 Type 2 diabetes mellitus without complications: Secondary | ICD-10-CM | POA: Diagnosis not present

## 2022-08-20 DIAGNOSIS — H2513 Age-related nuclear cataract, bilateral: Secondary | ICD-10-CM | POA: Diagnosis not present

## 2022-08-20 DIAGNOSIS — H40052 Ocular hypertension, left eye: Secondary | ICD-10-CM | POA: Diagnosis not present

## 2022-08-20 LAB — HM DIABETES EYE EXAM

## 2022-08-29 ENCOUNTER — Other Ambulatory Visit: Payer: Self-pay | Admitting: Adult Health

## 2022-08-29 DIAGNOSIS — Z9889 Other specified postprocedural states: Secondary | ICD-10-CM

## 2022-10-08 ENCOUNTER — Other Ambulatory Visit: Payer: Self-pay | Admitting: Adult Health

## 2022-10-08 ENCOUNTER — Ambulatory Visit
Admission: RE | Admit: 2022-10-08 | Discharge: 2022-10-08 | Disposition: A | Discharge status: Home / Self Care | Payer: Medicare HMO | Source: Ambulatory Visit | Attending: Adult Health | Admitting: Adult Health

## 2022-10-08 DIAGNOSIS — Z9889 Other specified postprocedural states: Secondary | ICD-10-CM

## 2022-10-08 DIAGNOSIS — N6459 Other signs and symptoms in breast: Secondary | ICD-10-CM | POA: Diagnosis not present

## 2022-12-03 ENCOUNTER — Ambulatory Visit
Admission: RE | Admit: 2022-12-03 | Discharge: 2022-12-03 | Disposition: A | Discharge status: Home / Self Care | Payer: Medicare HMO | Source: Ambulatory Visit | Attending: Radiation Oncology | Admitting: Radiation Oncology

## 2022-12-03 ENCOUNTER — Encounter: Payer: Self-pay | Admitting: Radiation Oncology

## 2022-12-03 VITALS — BP 129/65 | HR 89 | Temp 96.9°F | Resp 14 | Ht 63.0 in | Wt 193.3 lb

## 2022-12-03 DIAGNOSIS — Z923 Personal history of irradiation: Secondary | ICD-10-CM | POA: Diagnosis not present

## 2022-12-03 DIAGNOSIS — Z170 Estrogen receptor positive status [ER+]: Secondary | ICD-10-CM | POA: Insufficient documentation

## 2022-12-03 DIAGNOSIS — C50412 Malignant neoplasm of upper-outer quadrant of left female breast: Secondary | ICD-10-CM | POA: Insufficient documentation

## 2022-12-03 DIAGNOSIS — Z79811 Long term (current) use of aromatase inhibitors: Secondary | ICD-10-CM | POA: Insufficient documentation

## 2022-12-03 NOTE — Progress Notes (Signed)
Radiation Oncology Follow up Note  Name: Brandy Guerrero   Date:   12/03/2022 MRN:  [id number] DOB: [birth year]    This 72 y.o. female presents to the clinic today for 2-year follow-up status post whole breast radiation to her left breast for stage T1 invasive mammary carcinoma ER positive.  REFERRING PROVIDER: Eustaquio Boyden, MD  HPI: Patient is a 72 year old female now out 2 years having completed whole breast radiation to her left breast for stage Ia invasive mammary carcinoma ER positive.  Seen today in routine follow-up she is doing well.  She specifically denies breast tenderness cough or bone pain..  She had mammogram and ultrasound back in August which I reviewed were BI-RADS 2 benign.  She is currently on letrozole tolerating it well without side effect.  COMPLICATIONS OF TREATMENT: none  FOLLOW UP COMPLIANCE: keeps appointments   PHYSICAL EXAM:  BP 129/65   Pulse 89   Temp (!) 96.9 F (36.1 C)   Resp 14   Ht 5\' 3"  (1.6 m)   Wt 193 lb 4.8 oz (87.7 kg)   BMI 34.24 kg/m  Lungs are clear to A&P cardiac examination essentially unremarkable with regular rate and rhythm. No dominant mass or nodularity is noted in either breast in 2 positions examined. Incision is well-healed. No axillary or supraclavicular adenopathy is appreciated. Cosmetic result is excellent.  Well-developed well-nourished patient in NAD. HEENT reveals PERLA, EOMI, discs not visualized.  Oral cavity is clear. No oral mucosal lesions are identified. Neck is clear without evidence of cervical or supraclavicular adenopathy. Lungs are clear to A&P. Cardiac examination is essentially unremarkable with regular rate and rhythm without murmur rub or thrill. Abdomen is benign with no organomegaly or masses noted. Motor sensory and DTR levels are equal and symmetric in the upper and lower extremities. Cranial nerves II through XII are grossly intact. Proprioception is intact. No peripheral adenopathy or edema is identified.  No motor or sensory levels are noted. Crude visual fields are within normal range.  RADIOLOGY RESULTS: Mammogram ultrasound reviewed compatible with above-stated findings  PLAN: Present time patient is doing well with no evidence of disease now 2 years out from whole breast radiation and pleased with her overall progress.  I will see her 1 more time in a year and then discontinue follow-up care.  She continues on letrozole without side effect.  Patient knows to call with any concerns.  I would like to take this opportunity to thank you for allowing me to participate in the care of your patient.Carmina Miller, MD

## 2022-12-10 ENCOUNTER — Ambulatory Visit: Payer: Medicare HMO | Admitting: Family Medicine

## 2022-12-17 ENCOUNTER — Ambulatory Visit (INDEPENDENT_AMBULATORY_CARE_PROVIDER_SITE_OTHER): Payer: Medicare HMO | Admitting: Family Medicine

## 2022-12-17 ENCOUNTER — Encounter: Payer: Self-pay | Admitting: Family Medicine

## 2022-12-17 VITALS — BP 126/76 | HR 75 | Temp 98.2°F | Ht 63.0 in | Wt 197.0 lb

## 2022-12-17 DIAGNOSIS — E785 Hyperlipidemia, unspecified: Secondary | ICD-10-CM

## 2022-12-17 DIAGNOSIS — Z170 Estrogen receptor positive status [ER+]: Secondary | ICD-10-CM

## 2022-12-17 DIAGNOSIS — C50412 Malignant neoplasm of upper-outer quadrant of left female breast: Secondary | ICD-10-CM | POA: Diagnosis not present

## 2022-12-17 DIAGNOSIS — E1169 Type 2 diabetes mellitus with other specified complication: Secondary | ICD-10-CM | POA: Diagnosis not present

## 2022-12-17 DIAGNOSIS — Z7984 Long term (current) use of oral hypoglycemic drugs: Secondary | ICD-10-CM | POA: Diagnosis not present

## 2022-12-17 LAB — LIPID PANEL
Cholesterol: 160 mg/dL (ref 0–200)
HDL: 54.2 mg/dL (ref >39.00)
LDL Cholesterol: 95 mg/dL (ref 0–99)
NonHDL: 105.37
Total CHOL/HDL Ratio: 3
Triglycerides: 54 mg/dL (ref 0.0–149.0)
VLDL: 10.8 mg/dL (ref 0.0–40.0)

## 2022-12-17 LAB — COMPREHENSIVE METABOLIC PANEL
ALT: 12 U/L (ref 0–35)
AST: 14 U/L (ref 0–37)
Albumin: 4 g/dL (ref 3.5–5.2)
Alkaline Phosphatase: 132 U/L — ABNORMAL HIGH (ref 39–117)
BUN: 16 mg/dL (ref 6–23)
CO2: 30 meq/L (ref 19–32)
Calcium: 9.4 mg/dL (ref 8.4–10.5)
Chloride: 104 meq/L (ref 96–112)
Creatinine, Ser: 0.68 mg/dL (ref 0.40–1.20)
GFR: 87.83 mL/min (ref >60.00)
Glucose, Bld: 111 mg/dL — ABNORMAL HIGH (ref 70–99)
Potassium: 3.9 meq/L (ref 3.5–5.1)
Sodium: 141 meq/L (ref 135–145)
Total Bilirubin: 0.8 mg/dL (ref 0.2–1.2)
Total Protein: 6.5 g/dL (ref 6.0–8.3)

## 2022-12-17 LAB — HEMOGLOBIN A1C: Hgb A1c MFr Bld: 6.5 % (ref 4.6–6.5)

## 2022-12-17 LAB — VITAMIN D 25 HYDROXY (VIT D DEFICIENCY, FRACTURES): VITD: 28.17 ng/mL — ABNORMAL LOW (ref 30.00–100.00)

## 2022-12-17 LAB — VITAMIN B12: Vitamin B-12: 263 pg/mL (ref 211–911)

## 2022-12-17 MED ORDER — VITAMIN D3 25 MCG (1000 UT) PO CAPS: 1.0000 | ORAL_CAPSULE | Freq: Every day | ORAL | Status: DC

## 2022-12-17 NOTE — Assessment & Plan Note (Signed)
Appreciate onc/rad onc care.

## 2022-12-17 NOTE — Assessment & Plan Note (Addendum)
Chronic, update labs today on metformin and actos, tolerating well. Update B12 with endorsed fatigue.  Declines DSME referral

## 2022-12-17 NOTE — Patient Instructions (Addendum)
Labs today including cholesterol, A1c and vitamin b12.  Good to see you today  Return as needed or in 6 months for physical/wellness visit

## 2022-12-17 NOTE — Assessment & Plan Note (Signed)
Continues higher dose atorvastatin 40mg  - update FLP.  The 10-year ASCVD risk score (Arnett DK, et al., 2019) is: 25.1%   Values used to calculate the score:     Age: 72 years     Sex: Female     Is Non-Hispanic African American: Yes     Diabetic: Yes     Tobacco smoker: No     Systolic Blood Pressure: 129 mmHg     Is BP treated: No     HDL Cholesterol: 51.7 mg/dL     Total Cholesterol: 177 mg/dL

## 2022-12-17 NOTE — Progress Notes (Signed)
Ph: [phone] Fax: [phone]   Patient ID: Brandy Guerrero, female    DOB: 09-29-1950, 72 y.o.   MRN: [id number]  This visit was conducted in person.  BP 126/76   Pulse 75   Temp 98.2 F (36.8 C) (Oral)   Ht 5\' 3"  (1.6 m)   Wt 197 lb (89.4 kg)   SpO2 98%   BMI 34.90 kg/m    CC: DM f/u visit  Subjective:   HPI: Brandy Guerrero is a 72 y.o. female presenting on 12/17/2022 for Medical Management of Chronic Issues (Here for 6 mo DM f/u.)   She notes she stays tired.  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 Now on Femara, planned for 5 yrs.   Rpt pelvic US 12/2021 showed persistence of solid mass to midline/left pelvis (9x8.7x7.8cm)   HLD - last visit we increased atorvastatin to 40mg  daily for goal LDL <100.   DM - does not regularly check sugars. Compliant with antihyperglycemic regimen which includes: metformin 500mg  bid, latest addition of actos 15mg  daily. Jardiance 10mg  stopped 12/2021 due to persistent vaginal/vulgar burning and dysuria. Wants to avoid injectables. Denies low sugars or hypoglycemic symptoms. Denies paresthesias, blurry vision. Last diabetic eye exam [facility] - records requested today. Glucometer brand: doesn't have this. Last foot exam: DUE. DSME: declines. Lab Results  Component Value Date   HGBA1C 7.3 (H) 05/27/2022   Diabetic Foot Exam - Simple   Simple Foot Form Diabetic Foot exam was performed with the following findings: Yes 12/17/2022 10:30 AM  Visual Inspection No deformities, no ulcerations, no other skin breakdown bilaterally: Yes Sensation Testing Intact to touch and monofilament testing bilaterally: Yes Pulse Check Posterior Tibialis and Dorsalis pulse intact bilaterally: Yes Comments No claudication    Lab Results  Component Value Date   MICROALBUR 0.7 05/27/2022          Relevant past medical, surgical, family and social history reviewed and updated as indicated. Interim medical history since our last visit reviewed. Allergies and medications reviewed and updated. Outpatient Medications Prior to Visit  Medication Sig Dispense Refill   atorvastatin (LIPITOR) 40 MG tablet Take 1 tablet (40 mg total) by mouth daily. 90 tablet 4   clindamycin (CLEOCIN T) 1 % external solution Apply 1-2 times daily to scalp as needed for breakouts 60 mL 5   Fluocinolone Acetonide Body 0.01 % OIL APPLY TO SCALP 1-2 TIMES DAILY 119 mL 5   fluocinonide (LIDEX) 0.05 % external solution Apply 1-2 drops once or twice daily to affected areas on scalp as needed for itching 60 mL 2   ketoconazole (NIZORAL) 2 % shampoo 1-2 times per week lather on scalp, leave on 8-10 minutes, rinse well 120 mL 5   latanoprost (XALATAN) 0.005 % ophthalmic solution Place 1 drop into the left eye at bedtime.     letrozole (FEMARA) 2.5 MG tablet Take 1 tablet by mouth once daily 90 tablet 3   metFORMIN (GLUCOPHAGE) 500 MG tablet Take 1 tablet (500 mg total) by mouth 2 (two) times daily with a meal. 180 tablet 4   metoprolol tartrate (LOPRESSOR) 25 MG tablet Take 1 tablet (25 mg total) by mouth 2 (two) times daily as needed (heart fluttering). 60 tablet 1   minoxidil (LONITEN) 2.5 MG tablet Take 1 tablet once daily 30 tablet 5  pioglitazone (ACTOS) 15 MG tablet Take 1 tablet (15 mg total) by mouth daily. 30 tablet 6   vitamin E 180 MG (400 UNITS) capsule Take 400 Units by mouth daily. Take two by mouth daily     Cholecalciferol (VITAMIN D3) 25 MCG (1000 UT) CAPS Take 2 capsules (2,000 Units total) by mouth daily. 30 capsule    No facility-administered medications prior to visit.     Per HPI unless specifically indicated in ROS section below Review of Systems  Objective:  BP 126/76   Pulse 75   Temp 98.2 F (36.8 C) (Oral)   Ht 5\' 3"  (1.6 m)   Wt 197 lb (89.4 kg)   SpO2 98%   BMI 34.90 kg/m   Wt  Readings from Last 3 Encounters:  12/17/22 197 lb (89.4 kg)  12/03/22 193 lb 4.8 oz (87.7 kg)  06/06/22 181 lb 8 oz (82.3 kg)      Physical Exam Vitals and nursing note reviewed.  Constitutional:      Appearance: Normal appearance. She is not ill-appearing.  Eyes:     Extraocular Movements: Extraocular movements intact.     Conjunctiva/sclera: Conjunctivae normal.     Pupils: Pupils are equal, round, and reactive to light.  Cardiovascular:     Rate and Rhythm: Normal rate and regular rhythm.     Pulses: Normal pulses.     Heart sounds: Normal heart sounds. No murmur heard. Pulmonary:     Effort: Pulmonary effort is normal. No respiratory distress.     Breath sounds: Normal breath sounds. No wheezing, rhonchi or rales.  Musculoskeletal:     Right lower leg: No edema.     Left lower leg: No edema.  Skin:    General: Skin is warm and dry.     Findings: No rash.  Neurological:     Mental Status: She is alert.  Psychiatric:        Mood and Affect: Mood normal.        Behavior: Behavior normal.       Results for orders placed or performed in visit on 06/06/22  POCT Urinalysis Dipstick (Automated)  Result Value Ref Range   Color, UA yellow    Clarity, UA clear    Glucose, UA Negative Negative   Bilirubin, UA negative    Ketones, UA negative    Spec Grav, UA 1.020 1.010 - 1.025   Blood, UA negative    pH, UA 5.5 5.0 - 8.0   Protein, UA Negative Negative   Urobilinogen, UA 0.2 0.2 or 1.0 E.U./dL   Nitrite, UA negative    Leukocytes, UA Negative Negative   Lab Results  Component Value Date   CHOL 177 05/27/2022   HDL 51.70 05/27/2022   LDLCALC 112 (H) 05/27/2022   LDLDIRECT 111.0 09/28/2014   TRIG 69.0 05/27/2022   CHOLHDL 3 05/27/2022   Lab Results  Component Value Date   NA 143 05/27/2022   CL 102 05/27/2022   K 4.3 05/27/2022   CO2 29 05/27/2022   BUN 15 05/27/2022   CREATININE 0.71 05/27/2022   GFR 86.09 05/27/2022   CALCIUM 9.6 05/27/2022   ALBUMIN 4.3  05/27/2022   GLUCOSE 155 (H) 05/27/2022   Lab Results  Component Value Date   ALT 11 05/27/2022   AST 12 05/27/2022   ALKPHOS 148 (H) 05/27/2022   BILITOT 0.9 05/27/2022  No results found for: "VITAMINB12"   Assessment & Plan:   Problem List Items Addressed This Visit  Hyperlipidemia associated with type 2 diabetes mellitus (HCC)    Continues higher dose atorvastatin 40mg  - update FLP.  The 10-year ASCVD risk score (Arnett DK, et al., 2019) is: 25.1%   Values used to calculate the score:     Age: 66 years     Sex: Female     Is Non-Hispanic African American: Yes     Diabetic: Yes     Tobacco smoker: No     Systolic Blood Pressure: 129 mmHg     Is BP treated: No     HDL Cholesterol: 51.7 mg/dL     Total Cholesterol: 177 mg/dL       Relevant Orders   Lipid panel   Comprehensive metabolic panel   Type 2 diabetes mellitus with other specified complication (HCC) - Primary    Chronic, update labs today on metformin and actos, tolerating well. Update B12 with endorsed fatigue.  Declines DSME referral      Relevant Orders   Hemoglobin A1c   Vitamin B12   Malignant neoplasm of upper-outer quadrant of left breast in female, estrogen receptor positive (HCC)    Appreciate onc/rad onc care.       Relevant Orders   VITAMIN D 25 Hydroxy (Vit-D Deficiency, Fractures)     Meds ordered this encounter  Medications   Cholecalciferol (VITAMIN D3) 25 MCG (1000 UT) CAPS    Sig: Take 1 capsule (1,000 Units total) by mouth daily.    Orders Placed This Encounter  Procedures   Lipid panel   Hemoglobin A1c   Vitamin B12   Comprehensive metabolic panel   VITAMIN D 25 Hydroxy (Vit-D Deficiency, Fractures)    Patient Instructions  Labs today including cholesterol, A1c and vitamin b12.  Good to see you today  Return as needed or in 6 months for physical/wellness visit   Follow up plan: Return in about 6 months (around 06/16/2023) for annual exam, prior fasting for blood  work.  Eustaquio Boyden, MD

## 2022-12-20 ENCOUNTER — Other Ambulatory Visit: Payer: Self-pay | Admitting: Family Medicine

## 2022-12-20 ENCOUNTER — Encounter: Payer: Self-pay | Admitting: Family Medicine

## 2022-12-20 DIAGNOSIS — E559 Vitamin D deficiency, unspecified: Secondary | ICD-10-CM | POA: Insufficient documentation

## 2022-12-20 DIAGNOSIS — E538 Deficiency of other specified B group vitamins: Secondary | ICD-10-CM | POA: Insufficient documentation

## 2022-12-20 MED ORDER — VITAMIN B-12 1000 MCG PO TABS: 1000.0000 ug | ORAL_TABLET | Freq: Every day | ORAL | Status: DC

## 2022-12-23 ENCOUNTER — Encounter: Payer: Self-pay | Admitting: Family Medicine

## 2023-02-08 ENCOUNTER — Other Ambulatory Visit: Payer: Self-pay | Admitting: Dermatology

## 2023-02-08 DIAGNOSIS — L649 Androgenic alopecia, unspecified: Secondary | ICD-10-CM

## 2023-02-24 ENCOUNTER — Ambulatory Visit: Payer: Medicare HMO | Admitting: Dermatology

## 2023-03-04 ENCOUNTER — Ambulatory Visit: Payer: Medicare HMO | Admitting: Hematology and Oncology

## 2023-03-16 NOTE — Assessment & Plan Note (Signed)
Diagnostic mammogram and [place] on 07/13/20 showed a 7 mm mass in the 10 o'clock position of the left breast. 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   09/26/2020:Left lumpectomy: Grade 1 IDC 0.8 cm, with DCIS low-grade, 0/5 lymph nodes, ER 90%, PR 100%, HER2 negative, Ki-67 10%   Treatment plan: 1.  adjuvant radiation at [facility] completed 11/28/20 2.  Followed by adjuvant antiestrogen therapy started 12/24/20   Letrozole toxicities: Hot flashes: Patient had these even before starting treatment. Fatigue   Breast cancer surveillance: Breast exam 03/18/23: Benign Mammogram 10/08/2022: Benign breast density category C   Return to clinic in 1 year for follow-up

## 2023-03-18 ENCOUNTER — Inpatient Hospital Stay: Payer: Medicare HMO | Attending: Hematology and Oncology | Admitting: Hematology and Oncology

## 2023-03-18 VITALS — BP 134/57 | HR 88 | Temp 98.2°F | Resp 18 | Ht 63.0 in | Wt 191.3 lb

## 2023-03-18 DIAGNOSIS — Z1721 Progesterone receptor positive status: Secondary | ICD-10-CM | POA: Diagnosis not present

## 2023-03-18 DIAGNOSIS — Z1732 Human epidermal growth factor receptor 2 negative status: Secondary | ICD-10-CM | POA: Diagnosis not present

## 2023-03-18 DIAGNOSIS — C50412 Malignant neoplasm of upper-outer quadrant of left female breast: Secondary | ICD-10-CM | POA: Insufficient documentation

## 2023-03-18 DIAGNOSIS — Z170 Estrogen receptor positive status [ER+]: Secondary | ICD-10-CM | POA: Insufficient documentation

## 2023-03-18 DIAGNOSIS — Z79811 Long term (current) use of aromatase inhibitors: Secondary | ICD-10-CM | POA: Diagnosis not present

## 2023-03-18 DIAGNOSIS — Z79899 Other long term (current) drug therapy: Secondary | ICD-10-CM | POA: Diagnosis not present

## 2023-03-18 NOTE — Progress Notes (Signed)
 Patient Care Team: Rilla Baller, MD as PCP - General (Family Medicine) Curvin Deward MOULD, MD as Consulting Physician (General Surgery) Odean Potts, MD as Consulting Physician (Hematology and Oncology) Lenn Aran, MD as Consulting Physician (Radiation Oncology) Jane Charleston, MD as Consulting Physician (Orthopedic Surgery) [facility], [facility] (Optometry)  DIAGNOSIS:  Encounter Diagnosis  Name Primary?   Malignant neoplasm of upper-outer quadrant of left breast in female, estrogen receptor positive (HCC) Yes    SUMMARY OF ONCOLOGIC HISTORY: Oncology History  Atypical ductal hyperplasia of left breast  08/02/2020 Initial Diagnosis   Invasive ductal carcinoma of the left breast  Biopsy of left breast calcifications on 05/23/2016 showed atypical ductal hyperplasia. Diagnostic mammogram and US  on 07/13/20 showed a 7 mm mass in the 10 o'clock position of the left breast. 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   Malignant neoplasm of upper-outer quadrant of left breast in female, estrogen receptor positive (HCC)  08/02/2020 Initial Diagnosis   Screening mammogram detected left breast mass measuring 7 mm at 10 o'clock position.  Biopsy revealed grade 2 IDC with DCIS with calcifications, ER 90%, PR 100%, Ki67 10%, HER2 negative   09/26/2020 Surgery   Left lumpectomy: Grade 1 IDC 0.8 cm, with DCIS low-grade, 0/5 lymph nodes, ER 90%, PR 100%, HER2 negative, Ki-67 10%   10/30/2020 - 11/28/2020 Radiation Therapy   Adj [facility]   12/24/2020 -  Anti-estrogen oral therapy   Letrozole  2.5 mg daily   03/23/2021 Cancer Staging   Staging form: Breast, AJCC 8th Edition - Pathologic: Stage IA (pT1b, pN0, cM0, G1, ER+, PR+, HER2-) - Signed by Crawford Morna Pickle, NP on 03/23/2021 Histologic grading system: 3 grade system     CHIEF COMPLIANT: Follow-up on letrozole  therapy  HISTORY OF PRESENT  ILLNESS:  History of Present Illness   Brandy Guerrero is a 73 year old female with breast cancer who presents for follow-up care.  She is two and a half years post-surgery and radiation therapy for breast cancer. She continues to follow up with her oncologist and a radiation doctor at [facility]. No breast pain or discomfort is reported, and she is doing well overall.  She has been on letrozole  for a couple of years as part of her treatment regimen. She experiences hot flashes, although they are not as severe as before. She has about two and a half more years of treatment remaining.  She mentions needing knee surgery due to ongoing issues but has been postponing it. Her overall health is good, aside from her knee problems.  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 This responsibility impacts her energy levels and sleep, as she is up frequently at night to attend to her mother. She has support from hospice services and family members, but the situation is challenging.         ALLERGIES:  is allergic to sulfa antibiotics.  MEDICATIONS:  Current Outpatient Medications  Medication Sig Dispense Refill   atorvastatin  (LIPITOR) 40 MG tablet Take 1 tablet (40 mg total) by mouth daily. 90 tablet 4   Cholecalciferol (VITAMIN D3) 25 MCG (1000 UT) CAPS Take 1 capsule (1,000 Units total) by mouth daily.     clindamycin  (CLEOCIN  T) 1 % external solution Apply 1-2 times daily to scalp as needed for breakouts 60 mL 5   cyanocobalamin  (VITAMIN B12) 1000 MCG tablet  Take 1 tablet (1,000 mcg total) by mouth daily.     Fluocinolone  Acetonide Body 0.01 % OIL APPLY TO SCALP 1-2 TIMES DAILY 119 mL 5   fluocinonide  (LIDEX ) 0.05 % external solution Apply 1-2 drops once or twice daily to affected areas on scalp as needed for itching 60 mL 2   ketoconazole  (NIZORAL ) 2 % shampoo 1-2 times per week lather on scalp, leave on  8-10 minutes, rinse well 120 mL 5   latanoprost  (XALATAN ) 0.005 % ophthalmic solution Place 1 drop into the left eye at bedtime.     letrozole  (FEMARA ) 2.5 MG tablet Take 1 tablet by mouth once daily 90 tablet 3   metFORMIN  (GLUCOPHAGE ) 500 MG tablet Take 1 tablet (500 mg total) by mouth 2 (two) times daily with a meal. 180 tablet 4   metoprolol  tartrate (LOPRESSOR ) 25 MG tablet Take 1 tablet (25 mg total) by mouth 2 (two) times daily as needed (heart fluttering). 60 tablet 1   minoxidil  (LONITEN ) 2.5 MG tablet Take 1 tablet by mouth once daily 30 tablet 0   pioglitazone  (ACTOS ) 15 MG tablet Take 1 tablet (15 mg total) by mouth daily. 30 tablet 6   vitamin E 180 MG (400 UNITS) capsule Take 400 Units by mouth daily. Take two by mouth daily     No current facility-administered medications for this visit.    PHYSICAL EXAMINATION: ECOG PERFORMANCE STATUS: 1 - Symptomatic but completely ambulatory  Vitals:   03/18/23 1120  BP: (!) 134/57  Pulse: 88  Resp: 18  Temp: 98.2 F (36.8 C)  SpO2: 100%   Filed Weights   03/18/23 1120  Weight: 191 lb 4.8 oz (86.8 kg)    Physical Exam   BREAST: Scar tissue under the arm, no soreness. Everything else feels fine.      (exam performed in the presence of a chaperone)  LABORATORY DATA:  I have reviewed the data as listed    Latest Ref Rng & Units 12/17/2022   10:32 AM 05/27/2022    9:30 AM 05/22/2021   10:58 AM  CMP  Glucose 70 - 99 mg/dL 888  844  803   BUN 6 - 23 mg/dL 16  15  15    Creatinine 0.40 - 1.20 mg/dL 9.31  9.28  9.24   Sodium 135 - 145 mEq/L 141  143  139   Potassium 3.5 - 5.1 mEq/L 3.9  4.3  4.0   Chloride 96 - 112 mEq/L 104  102  103   CO2 19 - 32 mEq/L 30  29  30    Calcium  8.4 - 10.5 mg/dL 9.4  9.6  9.3   Total Protein 6.0 - 8.3 g/dL 6.5  7.1  6.9   Total Bilirubin 0.2 - 1.2 mg/dL 0.8  0.9  0.7   Alkaline Phos 39 - 117 U/L 132  148  140   AST 0 - 37 U/L 14  12  13    ALT 0 - 35 U/L 12  11  13      Lab Results  Component  Value Date   WBC 6.6 05/27/2022   HGB 12.3 05/27/2022   HCT 37.6 05/27/2022   MCV 91.9 05/27/2022   PLT 301.0 05/27/2022   NEUTROABS 4.5 05/27/2022    ASSESSMENT & PLAN:  Malignant neoplasm of upper-outer quadrant of left breast in female, estrogen receptor positive (HCC) Diagnostic mammogram and US  on 07/13/20 showed a 7 mm mass in the 10 o'clock position of the left breast. 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   09/26/2020:Left lumpectomy: Grade 1 IDC 0.8 cm, with DCIS low-grade, 0/5 lymph nodes, ER 90%, PR 100%, HER2 negative, Ki-67 10%   Treatment plan: 1.  adjuvant radiation at [facility] completed 11/28/20 2.  Followed by adjuvant antiestrogen therapy started 12/24/20   Letrozole  toxicities: Hot flashes: Patient had these even before starting treatment. Fatigue   Breast cancer surveillance: Breast exam 03/18/23: Benign Mammogram 10/08/2022: Benign breast density category C   Return to clinic in 1 year for follow-up ------------------------------------- Caregiver Stress Karna is experiencing significant stress due to caregiving responsibilities for her 37 year old mother, who has cognitive decline and requires extensive care. This situation is affecting her sleep and overall well-being. Discussed the possibility of long-term care placement and respite care options. - Discuss respite care options with social services - Evaluate the possibility of long-term care placement for her mother   No orders of the defined types were placed in this encounter.  The patient has a good understanding of the overall plan. she agrees with it. she will call with any problems that may develop before the next visit here. Total time spent: 30 mins including face to face time and time spent for planning, charting and co-ordination of care   Viinay K Demarious Kapur, MD 03/18/23

## 2023-04-11 HISTORY — PX: GLAUCOMA SURGERY: SHX656

## 2023-04-15 DIAGNOSIS — H2513 Age-related nuclear cataract, bilateral: Secondary | ICD-10-CM | POA: Diagnosis not present

## 2023-04-15 DIAGNOSIS — H40052 Ocular hypertension, left eye: Secondary | ICD-10-CM | POA: Diagnosis not present

## 2023-04-16 ENCOUNTER — Other Ambulatory Visit: Payer: Self-pay | Admitting: Family Medicine

## 2023-04-16 ENCOUNTER — Telehealth: Payer: Self-pay

## 2023-04-16 ENCOUNTER — Other Ambulatory Visit: Payer: Self-pay | Admitting: Dermatology

## 2023-04-16 DIAGNOSIS — E1169 Type 2 diabetes mellitus with other specified complication: Secondary | ICD-10-CM

## 2023-04-16 DIAGNOSIS — L649 Androgenic alopecia, unspecified: Secondary | ICD-10-CM

## 2023-04-16 NOTE — Telephone Encounter (Signed)
 Patient wants refill of Minoxidil RX.  She rescheduled for your next available in June.  Refill or deny?

## 2023-04-19 ENCOUNTER — Other Ambulatory Visit: Payer: Self-pay | Admitting: Dermatology

## 2023-04-19 DIAGNOSIS — L649 Androgenic alopecia, unspecified: Secondary | ICD-10-CM

## 2023-04-20 NOTE — Telephone Encounter (Signed)
 Jackie sent in refill. aw

## 2023-04-29 DIAGNOSIS — H40052 Ocular hypertension, left eye: Secondary | ICD-10-CM | POA: Diagnosis not present

## 2023-05-06 ENCOUNTER — Encounter (HOSPITAL_BASED_OUTPATIENT_CLINIC_OR_DEPARTMENT_OTHER): Payer: Self-pay | Admitting: Orthopaedic Surgery

## 2023-05-06 ENCOUNTER — Ambulatory Visit (HOSPITAL_BASED_OUTPATIENT_CLINIC_OR_DEPARTMENT_OTHER): Admitting: Orthopaedic Surgery

## 2023-05-06 ENCOUNTER — Ambulatory Visit (HOSPITAL_BASED_OUTPATIENT_CLINIC_OR_DEPARTMENT_OTHER)

## 2023-05-06 DIAGNOSIS — M170 Bilateral primary osteoarthritis of knee: Secondary | ICD-10-CM | POA: Diagnosis not present

## 2023-05-06 DIAGNOSIS — M25562 Pain in left knee: Secondary | ICD-10-CM

## 2023-05-06 DIAGNOSIS — G8929 Other chronic pain: Secondary | ICD-10-CM | POA: Diagnosis not present

## 2023-05-06 DIAGNOSIS — M25561 Pain in right knee: Secondary | ICD-10-CM

## 2023-05-06 MED ORDER — LIDOCAINE HCL 1 % IJ SOLN
4.0000 mL | INTRAMUSCULAR | Status: AC | PRN
  Administered 2023-05-06: 4 mL

## 2023-05-06 MED ORDER — TRIAMCINOLONE ACETONIDE 40 MG/ML IJ SUSP
80.0000 mg | INTRAMUSCULAR | Status: AC | PRN
Start: 2023-05-06 — End: 2023-05-06
  Administered 2023-05-06: 80 mg via INTRA_ARTICULAR

## 2023-05-06 MED ORDER — TRIAMCINOLONE ACETONIDE 40 MG/ML IJ SUSP
80.0000 mg | INTRAMUSCULAR | Status: AC | PRN
  Administered 2023-05-06: 80 mg via INTRA_ARTICULAR

## 2023-05-06 NOTE — Progress Notes (Signed)
 Chief Complaint: Left worse than right knee pain     History of Present Illness:    Brandy Guerrero is a 73 y.o. female today with left worse than right knee pain.  She states that she is here today for further discussion.  She has had a previous injection as well as hyaluronic acid gel injection in the left knee.  She has never had any treatments or injections on the right knee.  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  She is on her feet essentially all day.  She is taking care of her elderly mother.    PMH/PSH/Family History/Social History/Meds/Allergies:    Past Medical History:  Diagnosis Date  . Acne    dermatology - on doxy 100mg  daily  . Anemia   . Breast cancer (HCC)   . Bursitis of right shoulder   . COVID-19 02/12/2022  . History of atrial fibrillation 05/11/2005   h/o parox Afib, nl cardiolite/echo, no prolonged episodes.  metoprolol prn Tresa Endo)  . HLD (hyperlipidemia)   . Obesity   . Panic attacks   . Positive occult stool blood test 02/11/2012   s/p normal colonoscopy, rpt stool cards negative   Past Surgical History:  Procedure Laterality Date  . ABDOMINAL HYSTERECTOMY  1990s   for fibroids, has one ovary remaining ([place]). partial  . BREAST BIOPSY    . BREAST LUMPECTOMY Left 09/26/2020  . BREAST LUMPECTOMY WITH RADIOACTIVE SEED AND SENTINEL LYMPH NODE BIOPSY Left 09/26/2020   Procedure: LEFT BREAST LUMPECTOMY WITH RADIOACTIVE SEED AND SENTINEL LYMPH NODE BIOPSY;  Surgeon: Griselda Miner, MD;  Location: [facility] OR;  Service: General;  Laterality: Left;  . COLONOSCOPY  01/2012   mod diverticulosis, rec rpt stool cards and if + rec EGD (returned negative), rpt colonoscopy 10 yrs  . COLONOSCOPY  03/2022   5 TAs, diverticulosis, rpt 3 yrs Chief Strategy Officer)   Social History   Socioeconomic History  . Marital status: Married    Spouse name: Not on file  . Number of children: Not on file  . Years of education: Not on file  . Highest  education level: Not on file  Occupational History  . Not on file  Tobacco Use  . Smoking status: Never  . Smokeless tobacco: Never  Vaping Use  . Vaping status: Never Used  Substance and Sexual Activity  . Alcohol use: Yes    Alcohol/week: 0.0 standard drinks of alcohol    Comment: Rare  . Drug use: No  . Sexual activity: Yes    Birth control/protection: Surgical    Comment: hysterectomy  Other Topics Concern  . Not on file  Social History Narrative   Caffeine: none    Lives with husband, has grown children, outside pets (husband is deer Therapist, nutritional)    Occupation: CNA at [employer], works 2nd shift, [employer] on weekends    Activity: starting regular walking - 3 mi about 2d/wk    Diet: good water, fruits/vegetables daily    Social Drivers of Corporate investment banker Strain: Low Risk  (06/02/2022)   Overall Financial Resource Strain (CARDIA)   . Difficulty of Paying Living Expenses: Not hard at all  Food Insecurity: No Food Insecurity (06/02/2022)   Hunger Vital Sign   . Worried About Programme researcher, broadcasting/film/video in the Last Year: Never true   . Ran Out of Food in the Last Year: Never true  Transportation Needs: No Transportation Needs (06/02/2022)  PRAPARE - Transportation   . Lack of Transportation (Medical): No   . Lack of Transportation (Non-Medical): No  Physical Activity: Inactive (06/02/2022)   Exercise Vital Sign   . Days of Exercise per Week: 0 days   . Minutes of Exercise per Session: 0 min  Stress: No Stress Concern Present (06/02/2022)   [employer] of Occupational Health - Occupational Stress Questionnaire   . Feeling of Stress : Not at all  Social Connections: Moderately Isolated (06/02/2022)   Social Connection and Isolation Panel [NHANES]   . Frequency of Communication with Friends and Family: More than three times a week   . Frequency of Social Gatherings with Friends and Family: Once a week   . Attends Religious Services: Never   . Active Member of Clubs  or Organizations: No   . Attends Banker Meetings: Never   . Marital Status: Married   Family History  Problem Relation Age of Onset  . Hypertension Mother   . Alcohol abuse Father   . Alcohol abuse Brother   . Diabetes Daughter   . Cancer Neg Hx   . Coronary artery disease Neg Hx   . Stroke Neg Hx   . Colon cancer Neg Hx   . Colon polyps Neg Hx   . Esophageal cancer Neg Hx   . Rectal cancer Neg Hx   . Stomach cancer Neg Hx    Allergies  Allergen Reactions  . Sulfa Antibiotics Rash   Current Outpatient Medications  Medication Sig Dispense Refill  . atorvastatin (LIPITOR) 40 MG tablet Take 1 tablet (40 mg total) by mouth daily. 90 tablet 4  . Cholecalciferol (VITAMIN D3) 25 MCG (1000 UT) CAPS Take 1 capsule (1,000 Units total) by mouth daily.    . clindamycin (CLEOCIN T) 1 % external solution Apply 1-2 times daily to scalp as needed for breakouts 60 mL 5  . cyanocobalamin (VITAMIN B12) 1000 MCG tablet Take 1 tablet (1,000 mcg total) by mouth daily.    . Fluocinolone Acetonide Body 0.01 % OIL APPLY TO SCALP 1-2 TIMES DAILY 119 mL 5  . fluocinonide (LIDEX) 0.05 % external solution Apply 1-2 drops once or twice daily to affected areas on scalp as needed for itching 60 mL 2  . ketoconazole (NIZORAL) 2 % shampoo 1-2 times per week lather on scalp, leave on 8-10 minutes, rinse well 120 mL 5  . latanoprost (XALATAN) 0.005 % ophthalmic solution Place 1 drop into the left eye at bedtime.    Marland Kitchen letrozole (FEMARA) 2.5 MG tablet Take 1 tablet by mouth once daily 90 tablet 3  . metFORMIN (GLUCOPHAGE) 500 MG tablet Take 1 tablet (500 mg total) by mouth 2 (two) times daily with a meal. 180 tablet 4  . metoprolol tartrate (LOPRESSOR) 25 MG tablet Take 1 tablet (25 mg total) by mouth 2 (two) times daily as needed (heart fluttering). 60 tablet 1  . minoxidil (LONITEN) 2.5 MG tablet TAKE 1 TABLET BY MOUTH ONCE DAILY . APPOINTMENT REQUIRED FOR FUTURE REFILLS 30 tablet 0  . pioglitazone  (ACTOS) 15 MG tablet Take 1 tablet by mouth once daily 90 tablet 0  . vitamin E 180 MG (400 UNITS) capsule Take 400 Units by mouth daily. Take two by mouth daily     No current facility-administered medications for this visit.   No results found.  Review of Systems:   A ROS was performed including pertinent positives and negatives as documented in the HPI.  Physical Exam :  Constitutional: NAD and appears stated age Neurological: Alert and oriented Psych: Appropriate affect and cooperative There were no vitals taken for this visit.   Comprehensive Musculoskeletal Exam:    Bilateral tenderness to palpation about the medial joint line.  Positive crepitus medially.  No tenderness about the patellofemoral or lateral joint lines bilaterally.  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   Assessment and Plan:   73 y.o. female with medial compartment osteoarthritis of both knees.  At today's visit I did discuss that given the fact she has not had injections in quite some time and she is overall quite active I would start initially with bilateral ultrasound-guided injections of the knees that we can hopefully get her some relief.  Ultimately I do believe she may be a candidate for arthroplasty in the future although at this time she is continuing to work full-time and as result I do believe she would get some initial benefit from injections  -Bilateral knee ultrasound-guided injection was performed after verbal consent obtained     Procedure Note  Patient: Brandy Guerrero             Date of Birth: 1950-03-31           MRN: [id number]             Visit Date: 05/06/2023  Procedures: Visit Diagnoses:  1. Chronic pain of left knee   2. Chronic pain of right knee     Large Joint Inj: R knee on 05/06/2023  2:48 PM Indications: pain Details: 22 G 1.5 in needle, ultrasound-guided anterior approach  Arthrogram: No  Medications: 4 mL lidocaine 1 %; 80 mg triamcinolone acetonide 40 MG/ML Outcome: tolerated well, no immediate complications Procedure, treatment alternatives, risks and benefits explained, specific risks discussed. Consent was given by the patient. Immediately prior to procedure a time out was called to verify the correct patient, procedure, equipment, support staff and site/side marked as required. Patient was prepped and draped in the usual sterile fashion.    Large Joint Inj: L knee on 05/06/2023 2:48 PM Indications: pain Details: 22 G 1.5 in needle, ultrasound-guided anterior approach  Arthrogram: No  Medications: 4 mL lidocaine 1 %; 80 mg triamcinolone acetonide 40 MG/ML Outcome: tolerated well, no immediate complications Procedure, treatment alternatives, risks and benefits explained, specific risks discussed. Consent was given by the patient. Immediately prior to procedure a time out was called to verify the correct patient, procedure, equipment, support staff and site/side marked as required. Patient was prepped and draped in the usual sterile fashion.       I personally saw and evaluated the patient, and participated in the management and treatment plan.  Huel Cote, MD Attending Physician, Orthopedic Surgery  This document was dictated using Dragon voice recognition software. A reasonable attempt at proof reading has been made to minimize errors.

## 2023-05-27 DIAGNOSIS — H40052 Ocular hypertension, left eye: Secondary | ICD-10-CM | POA: Diagnosis not present

## 2023-06-04 ENCOUNTER — Ambulatory Visit: Payer: Medicare HMO

## 2023-06-04 VITALS — Ht 63.0 in | Wt 191.0 lb

## 2023-06-04 DIAGNOSIS — Z1231 Encounter for screening mammogram for malignant neoplasm of breast: Secondary | ICD-10-CM | POA: Diagnosis not present

## 2023-06-04 DIAGNOSIS — Z0000 Encounter for general adult medical examination without abnormal findings: Secondary | ICD-10-CM

## 2023-06-04 NOTE — Patient Instructions (Signed)
 Brandy Guerrero , Thank you for taking time to come for your Medicare Wellness Visit. I appreciate your ongoing commitment to your health goals. Please review the following plan we discussed and let me know if I can assist you in the future.   Referrals/Orders/Follow-Ups/Clinician Recommendations:   You have an order for:  []   2D Mammogram  [x]   3D Mammogram  []   Bone Density     Please call for appointment: Due on or after 10/09/2023 [facility] [address] [place], [place] [address] [phone]    Make sure to wear two-piece clothing.  No lotions, powders, or deodorants the day of the appointment. Make sure to bring picture ID and insurance card.  Bring list of medications you are currently taking including any supplements.    This is a list of the screening recommended for you and due dates:  Health Maintenance  Topic Date Due   Zoster (Shingles) Vaccine (1 of 2) Never done   DTaP/Tdap/Td vaccine (2 - Td or Tdap) 10/20/2021   COVID-19 Vaccine (5 - 2024-25 season) 10/12/2022   Yearly kidney health urinalysis for diabetes  05/27/2023   Hemoglobin A1C  06/16/2023   Eye exam for diabetics  08/20/2023   Flu Shot  09/11/2023   Mammogram  10/08/2023   Yearly kidney function blood test for diabetes  12/17/2023   Complete foot exam   12/17/2023   Medicare Annual Wellness Visit  06/03/2024   Colon Cancer Screening  04/02/2025   Pneumonia Vaccine  Completed   DEXA scan (bone density measurement)  Completed   Hepatitis C Screening  Completed   HPV Vaccine  Aged Out   Meningitis B Vaccine  Aged Out    Advanced directives: (Declined) Advance directive discussed with you today. Even though you declined this today, please call our office should you change your mind, and we can give you the proper paperwork for you to fill out.  Next Medicare Annual Wellness Visit scheduled for next year: Yes 06/06/24 @ 3:40pm televisit

## 2023-06-04 NOTE — Progress Notes (Signed)
 Please attest and cosign this visit due to patients primary care provider not being in the office at the time the visit was completed.    Subjective:   Brandy Guerrero is a 73 y.o. who presents for a Medicare Wellness preventive visit.  Visit Complete: Virtual I connected with  Ashton Lawyer on 06/04/23 by a audio enabled telemedicine application and verified that I am speaking with the correct person using two identifiers.  Patient Location: Home  Provider Location: Office/Clinic  I discussed the limitations of evaluation and management by telemedicine. The patient expressed understanding and agreed to proceed.  Vital Signs: Because this visit was a virtual/telehealth visit, some criteria may be missing or patient reported. Any vitals not documented were not able to be obtained and vitals that have been documented are patient reported.  VideoDeclined- This patient declined Librarian, academic. Therefore the visit was completed with audio only.  Persons Participating in Visit: Patient.  AWV Questionnaire: No: Patient Medicare AWV questionnaire was not completed prior to this visit.  Cardiac Risk Factors include: advanced age (>37men, >82 women);diabetes mellitus;dyslipidemia;obesity (BMI >30kg/m2)     Objective:    Today's Vitals   06/04/23 1058 06/04/23 1100  Weight: 191 lb (86.6 kg)   Height: 5\' 3"  (1.6 m)   PainSc:  4    Body mass index is 33.83 kg/m.     06/04/2023   11:10 AM 12/03/2022   10:50 AM 06/02/2022    3:33 PM 12/26/2020    9:35 AM 09/21/2020   11:19 AM 03/08/2020   10:25 AM  Advanced Directives  Does Patient Have a Medical Advance Directive? No No No No No No  Would patient like information on creating a medical advance directive?  No - Patient declined No - Patient declined No - Patient declined No - Patient declined No - Patient declined    Current Medications (verified) Outpatient Encounter Medications as of 06/04/2023   Medication Sig   atorvastatin  (LIPITOR) 40 MG tablet Take 1 tablet (40 mg total) by mouth daily.   Cholecalciferol (VITAMIN D3) 25 MCG (1000 UT) CAPS Take 1 capsule (1,000 Units total) by mouth daily.   clindamycin  (CLEOCIN  T) 1 % external solution Apply 1-2 times daily to scalp as needed for breakouts   cyanocobalamin  (VITAMIN B12) 1000 MCG tablet Take 1 tablet (1,000 mcg total) by mouth daily.   Fluocinolone  Acetonide Body 0.01 % OIL APPLY TO SCALP 1-2 TIMES DAILY   fluocinonide  (LIDEX ) 0.05 % external solution Apply 1-2 drops once or twice daily to affected areas on scalp as needed for itching   ketoconazole  (NIZORAL ) 2 % shampoo 1-2 times per week lather on scalp, leave on 8-10 minutes, rinse well   latanoprost  (XALATAN ) 0.005 % ophthalmic solution Place 1 drop into the left eye at bedtime.   letrozole  (FEMARA ) 2.5 MG tablet Take 1 tablet by mouth once daily   metFORMIN  (GLUCOPHAGE ) 500 MG tablet Take 1 tablet (500 mg total) by mouth 2 (two) times daily with a meal.   metoprolol  tartrate (LOPRESSOR ) 25 MG tablet Take 1 tablet (25 mg total) by mouth 2 (two) times daily as needed (heart fluttering).   minoxidil  (LONITEN ) 2.5 MG tablet TAKE 1 TABLET BY MOUTH ONCE DAILY . APPOINTMENT REQUIRED FOR FUTURE REFILLS   pioglitazone  (ACTOS ) 15 MG tablet Take 1 tablet by mouth once daily   vitamin E 180 MG (400 UNITS) capsule Take 400 Units by mouth daily. Take two by mouth daily  No facility-administered encounter medications on file as of 06/04/2023.    Allergies (verified) Sulfa antibiotics   History: Past Medical History:  Diagnosis Date   Acne    dermatology - on doxy 100mg  daily   Anemia    Breast cancer (HCC)    Bursitis of right shoulder    COVID-19 02/12/2022   History of atrial fibrillation 05/11/2005   h/o parox Afib, nl cardiolite/echo, no prolonged episodes.  metoprolol  prn Loetta Ringer)   HLD (hyperlipidemia)    Obesity    Panic attacks    Positive occult stool blood test  02/11/2012   s/p normal colonoscopy, rpt stool cards negative   Past Surgical History:  Procedure Laterality Date   ABDOMINAL HYSTERECTOMY  1990s   for fibroids, has one ovary remaining ([place]). partial   BREAST BIOPSY     BREAST LUMPECTOMY Left 09/26/2020   BREAST LUMPECTOMY WITH RADIOACTIVE SEED AND SENTINEL LYMPH NODE BIOPSY Left 09/26/2020   Procedure: LEFT BREAST LUMPECTOMY WITH RADIOACTIVE SEED AND SENTINEL LYMPH NODE BIOPSY;  Surgeon: Caralyn Chandler, MD;  Location: [facility] OR;  Service: General;  Laterality: Left;   COLONOSCOPY  01/2012   mod diverticulosis, rec rpt stool cards and if + rec EGD (returned negative), rpt colonoscopy 10 yrs   COLONOSCOPY  03/2022   5 TAs, diverticulosis, rpt 3 yrs ([facility])   Family History  Problem Relation Age of Onset   Hypertension Mother    Alcohol abuse Father    Alcohol abuse Brother    Diabetes Daughter    Cancer Neg Hx    Coronary artery disease Neg Hx    Stroke Neg Hx    Colon cancer Neg Hx    Colon polyps Neg Hx    Esophageal cancer Neg Hx    Rectal cancer Neg Hx    Stomach cancer Neg Hx    Social History   Socioeconomic History   Marital status: Married    Spouse name: Not on file   Number of children: Not on file   Years of education: Not on file   Highest education level: Not on file  Occupational History   Not on file  Tobacco Use   Smoking status: Never   Smokeless tobacco: Never  Vaping Use   Vaping status: Never Used  Substance and Sexual Activity   Alcohol use: Yes    Alcohol/week: 0.0 standard drinks of alcohol    Comment: Rare   Drug use: No   Sexual activity: Yes    Birth control/protection: Surgical    Comment: hysterectomy  Other Topics Concern   Not on file  Social History Narrative   Caffeine: none    Lives with husband, has grown children, outside pets (husband is Product manager)    Occupation: CNA at [employer], works 2nd shift, [employer] on weekends    Activity: starting regular walking - 3  mi about 2d/wk    Diet: good water, fruits/vegetables daily    Social Drivers of Corporate investment banker Strain: Low Risk  (06/04/2023)   Overall Financial Resource Strain (CARDIA)    Difficulty of Paying Living Expenses: Not hard at all  Food Insecurity: No Food Insecurity (06/04/2023)   Hunger Vital Sign    Worried About Running Out of Food in the Last Year: Never true    Ran Out of Food in the Last Year: Never true  Transportation Needs: No Transportation Needs (06/04/2023)   PRAPARE - Administrator, Civil Service (Medical): No  Lack of Transportation (Non-Medical): No  Physical Activity: Sufficiently Active (06/04/2023)   Exercise Vital Sign    Days of Exercise per Week: 5 days    Minutes of Exercise per Session: 30 min  Stress: No Stress Concern Present (06/04/2023)   [employer] of Occupational Health - Occupational Stress Questionnaire    Feeling of Stress : Not at all  Social Connections: Moderately Integrated (06/04/2023)   Social Connection and Isolation Panel [NHANES]    Frequency of Communication with Friends and Family: More than three times a week    Frequency of Social Gatherings with Friends and Family: Once a week    Attends Religious Services: More than 4 times per year    Active Member of [employer] or Organizations: No    Attends Engineer, structural: Never    Marital Status: Married    Tobacco Counseling Counseling given: Not Answered    Clinical Intake:  Pre-visit preparation completed: Yes  Pain : 0-10 Pain Score: 4  Pain Type: Acute pain (dental pain: had filing today) Pain Location: Teeth Pain Descriptors / Indicators: Aching Pain Onset: Yesterday Pain Frequency: Intermittent Pain Relieving Factors: tylenol  and IBU  Pain Relieving Factors: tylenol  and IBU  BMI - recorded: 33.83 Nutritional Status: BMI > 30  Obese Nutritional Risks: None Diabetes: Yes CBG done?: No Did pt. bring in CBG monitor from home?:  No  Lab Results  Component Value Date   HGBA1C 6.5 12/17/2022   HGBA1C 7.3 (H) 05/27/2022   HGBA1C 7.5 (A) 12/04/2021     How often do you need to have someone help you when you read instructions, pamphlets, or other written materials from your doctor or pharmacy?: 1 - Never  Interpreter Needed?: No  Comments: lives w/husband Information entered by :: B.Jacquese Cassarino,LPN   Activities of Daily Living     06/04/2023   11:11 AM  In your present state of health, do you have any difficulty performing the following activities:  Hearing? 0  Vision? 0  Difficulty concentrating or making decisions? 0  Walking or climbing stairs? 0  Dressing or bathing? 0  Doing errands, shopping? 0  Preparing Food and eating ? N  Using the Toilet? N  In the past six months, have you accidently leaked urine? N  Do you have problems with loss of bowel control? N  Managing your Medications? N  Managing your Finances? N  Housekeeping or managing your Housekeeping? N    Patient Care Team: Claire Crick, MD as PCP - General (Family Medicine) Caralyn Chandler, MD as Consulting Physician (General Surgery) Cameron Cea, MD as Consulting Physician (Hematology and Oncology) Glenis Langdon, MD as Consulting Physician (Radiation Oncology) Elly Habermann, MD as Consulting Physician (Orthopedic Surgery) [facility], [facility] (Optometry)  Indicate any recent Medical Services you may have received from other than [facility] providers in the past year (date may be approximate).     Assessment:   This is a routine wellness examination for [place].  Hearing/Vision screen Hearing Screening - Comments:: Pt says her hearing is good Vision Screening - Comments:: Pt says her vision is good [facility]   Goals Addressed             This Visit's Progress    Patient Stated       I would like to manage diabetes well       Depression Screen     06/04/2023   11:05 AM 12/17/2022   10:21 AM 06/02/2022    3:44  PM 05/22/2021   10:20 AM 04/23/2020    4:10 PM 12/27/2018   11:17 AM 10/28/2017   10:54 AM  PHQ 2/9 Scores  PHQ - 2 Score 0 3 0 0 3 2 2   PHQ- 9 Score  7   9 7 4     Fall Risk     06/04/2023   11:03 AM 12/17/2022   10:21 AM 06/02/2022    3:34 PM 05/22/2021   10:19 AM 04/23/2020    3:02 PM  Fall Risk   Falls in the past year? 0 0 1 0 0  Number falls in past yr: 0  0    Comment   tripped outside    Injury with Fall? 0  0    Risk for fall due to : No Fall Risks  No Fall Risks    Follow up Education provided;Falls prevention discussed  Falls prevention discussed;Falls evaluation completed      MEDICARE RISK AT HOME:  Medicare Risk at Home Any stairs in or around the home?: No If so, are there any without handrails?: No Home free of loose throw rugs in walkways, pet beds, electrical cords, etc?: Yes Adequate lighting in your home to reduce risk of falls?: Yes Life alert?: No Use of a cane, walker or w/c?: No Grab bars in the bathroom?: No Shower chair or bench in shower?: No Elevated toilet seat or a handicapped toilet?: No  TIMED UP AND GO:  Was the test performed?  No  Cognitive Function: 6CIT completed        06/04/2023   11:12 AM 06/02/2022    3:35 PM  6CIT Screen  What Year? 0 points 0 points  What month? 0 points 0 points  What time? 0 points 0 points  Count back from 20 -- 0 points  Months in reverse -- 0 points  Repeat phrase 0 points 2 points  Total Score  2 points    Immunizations Immunization History  Administered Date(s) Administered   Fluad Quad(high Dose 65+) 12/13/2020, 12/10/2022   Influenza, High Dose Seasonal PF 12/26/2017, 03/19/2020   Influenza-Unspecified 11/10/2012, 11/10/2013, 10/12/2015, 12/09/2021   Moderna Covid-19 Vaccine Bivalent Booster 22yrs & up 12/10/2020   Moderna Sars-Covid-2 Vaccination 03/16/2019, 04/13/2019, 12/29/2019   PPD Test 12/01/2018, 12/22/2018   Pneumococcal Conjugate-13 04/28/2017   Pneumococcal Polysaccharide-23  12/27/2018   Tdap 10/21/2011    Screening Tests Health Maintenance  Topic Date Due   Zoster Vaccines- Shingrix (1 of 2) Never done   DTaP/Tdap/Td (2 - Td or Tdap) 10/20/2021   COVID-19 Vaccine (5 - 2024-25 season) 10/12/2022   Diabetic kidney evaluation - Urine ACR  05/27/2023   HEMOGLOBIN A1C  06/16/2023   OPHTHALMOLOGY EXAM  08/20/2023   INFLUENZA VACCINE  09/11/2023   MAMMOGRAM  10/08/2023   Diabetic kidney evaluation - eGFR measurement  12/17/2023   FOOT EXAM  12/17/2023   Medicare Annual Wellness (AWV)  06/03/2024   Colonoscopy  04/02/2025   Pneumonia Vaccine 38+ Years old  Completed   DEXA SCAN  Completed   Hepatitis C Screening  Completed   HPV VACCINES  Aged Out   Meningococcal B Vaccine  Aged Out    Health Maintenance  Health Maintenance Due  Topic Date Due   Zoster Vaccines- Shingrix (1 of 2) Never done   DTaP/Tdap/Td (2 - Td or Tdap) 10/20/2021   COVID-19 Vaccine (5 - 2024-25 season) 10/12/2022   Diabetic kidney evaluation - Urine ACR  05/27/2023   Health Maintenance Items Addressed:  Mammogram ordered  Additional Screening:  Vision Screening: Recommended annual ophthalmology exams for early detection of glaucoma and other disorders of the eye.  Dental Screening: Recommended annual dental exams for proper oral hygiene  Community Resource Referral / Chronic Care Management: CRR required this visit?  No   CCM required this visit?  No    Plan:     I have personally reviewed and noted the following in the patient's chart:   Medical and social history Use of alcohol, tobacco or illicit drugs  Current medications and supplements including opioid prescriptions. Patient is not currently taking opioid prescriptions. Functional ability and status Nutritional status Physical activity Advanced directives List of other physicians Hospitalizations, surgeries, and ER visits in previous 12 months Vitals Screenings to include cognitive, depression, and  falls Referrals and appointments  In addition, I have reviewed and discussed with patient certain preventive protocols, quality metrics, and best practice recommendations. A written personalized care plan for preventive services as well as general preventive health recommendations were provided to patient.    Nerissa Bannister, LPN   1/61/0960   After Visit Summary: (MyChart) Due to this being a telephonic visit, the after visit summary with patients personalized plan was offered to patient via MyChart   Notes: Nothing significant to report at this time.

## 2023-06-16 ENCOUNTER — Ambulatory Visit: Payer: Self-pay

## 2023-06-16 NOTE — Telephone Encounter (Signed)
 We can discuss at CPE appt.  I will be out of office until that date.  If very bothersome, we can offer sooner appt as per Shannon's note.

## 2023-06-16 NOTE — Telephone Encounter (Signed)
  Chief Complaint: leg pain Symptoms: nighttime muscle cramps in bilateral legs Frequency: x 1 month Pertinent Negatives: Patient denies chest pain, SOB, leg swelling, rash, fever. Disposition: [] ED /[] Urgent Care (no appt availability in office) / [x] Appointment(In office/virtual)/ []  [facility]/ [] Home Care/ [] Refused Recommended Disposition /[] [facility]/ []  Follow-up with PCP Additional Notes: Patient states she gets up out of bed and walks around. Patient states she has been treating with eating mustard. Advised there are no acute appts with PCP until June, however patient has a physical exam in 2 weeks and if okay with provider she can address the issue during that time.  Copied From CRM [id number]. Reason for Triage: Patient is having cramps in legs at night. Wanting a nurse to call to give her suggestions  Reason for Disposition  Leg pain or muscle cramp is a chronic symptom (recurrent or ongoing AND present > 4 weeks)  Answer Assessment - Initial Assessment Questions 1. ONSET: "When did the pain start?"      About a month.  2. LOCATION: "Where is the pain located?"      Bilateral, she states mostly down in the lower legs but sometimes upper.  3. PAIN: "How bad is the pain?"    (Scale 1-10; or mild, moderate, severe)   -  MILD (1-3): doesn't interfere with normal activities    -  MODERATE (4-7): interferes with normal activities (e.g., work or school) or awakens from sleep, limping    -  SEVERE (8-10): excruciating pain, unable to do any normal activities, unable to walk     Denies the pain at this time, she states it's just when she lies down at night. She states sometimes it is severe. Cramping. 4. 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 5. CAUSE: "What do you think is causing the leg pain?"     Patient  unsure, states she has no diagnosis of RLS but is also diabetic. 6. OTHER SYMPTOMS: "Do you have any other symptoms?" (e.g., chest pain, back pain, breathing difficulty, swelling, rash, fever, numbness, weakness)     Denies. 7. PREGNANCY: "Is there any chance you are pregnant?" "When was your last menstrual period?"     N/A.  Protocols used: Leg Pain-A-AH

## 2023-06-16 NOTE — Telephone Encounter (Signed)
 Left message on VM for pt advising her that Dr Joelle Musca has quite a few openings this week if she would like to be seen sooner.

## 2023-06-16 NOTE — Telephone Encounter (Signed)
 Called with no answer and left VM for pt to return call to office

## 2023-06-17 NOTE — Telephone Encounter (Signed)
 Lvm asking pt to call back. Need to relay Dr Ocie Belt message and schedule OV with an available provider, if needed.

## 2023-06-18 NOTE — Telephone Encounter (Signed)
 Spoke with pt relaying Dr Ocie Belt message. Pt verbalizes understanding but wants to wait until 07/01/23 OV.

## 2023-06-24 ENCOUNTER — Other Ambulatory Visit (INDEPENDENT_AMBULATORY_CARE_PROVIDER_SITE_OTHER): Payer: Medicare HMO

## 2023-06-24 ENCOUNTER — Other Ambulatory Visit: Payer: Self-pay | Admitting: Family Medicine

## 2023-06-24 DIAGNOSIS — E538 Deficiency of other specified B group vitamins: Secondary | ICD-10-CM | POA: Diagnosis not present

## 2023-06-24 DIAGNOSIS — E785 Hyperlipidemia, unspecified: Secondary | ICD-10-CM

## 2023-06-24 DIAGNOSIS — E559 Vitamin D deficiency, unspecified: Secondary | ICD-10-CM

## 2023-06-24 DIAGNOSIS — E1169 Type 2 diabetes mellitus with other specified complication: Secondary | ICD-10-CM

## 2023-06-24 LAB — VITAMIN D 25 HYDROXY (VIT D DEFICIENCY, FRACTURES): VITD: 28.13 ng/mL — ABNORMAL LOW (ref 30.00–100.00)

## 2023-06-24 LAB — COMPREHENSIVE METABOLIC PANEL WITH GFR
ALT: 16 U/L (ref 0–35)
AST: 14 U/L (ref 0–37)
Albumin: 4.3 g/dL (ref 3.5–5.2)
Alkaline Phosphatase: 141 U/L — ABNORMAL HIGH (ref 39–117)
BUN: 15 mg/dL (ref 6–23)
CO2: 31 meq/L (ref 19–32)
Calcium: 9.6 mg/dL (ref 8.4–10.5)
Chloride: 103 meq/L (ref 96–112)
Creatinine, Ser: 0.75 mg/dL (ref 0.40–1.20)
GFR: 80 mL/min (ref >60.00)
Glucose, Bld: 162 mg/dL — ABNORMAL HIGH (ref 70–99)
Potassium: 4.1 meq/L (ref 3.5–5.1)
Sodium: 142 meq/L (ref 135–145)
Total Bilirubin: 0.7 mg/dL (ref 0.2–1.2)
Total Protein: 7 g/dL (ref 6.0–8.3)

## 2023-06-24 LAB — MICROALBUMIN / CREATININE URINE RATIO
Creatinine,U: 96.5 mg/dL
Microalb Creat Ratio: UNDETERMINED mg/g (ref 0.0–30.0)
Microalb, Ur: <0.7 mg/dL

## 2023-06-24 LAB — HEMOGLOBIN A1C: Hgb A1c MFr Bld: 7.3 % — ABNORMAL HIGH (ref 4.6–6.5)

## 2023-06-24 LAB — VITAMIN B12: Vitamin B-12: 266 pg/mL (ref 211–911)

## 2023-06-24 LAB — LIPID PANEL
Cholesterol: 193 mg/dL (ref 0–200)
HDL: 57.4 mg/dL (ref >39.00)
LDL Cholesterol: 123 mg/dL — ABNORMAL HIGH (ref 0–99)
NonHDL: 136.05
Total CHOL/HDL Ratio: 3
Triglycerides: 65 mg/dL (ref 0.0–149.0)
VLDL: 13 mg/dL (ref 0.0–40.0)

## 2023-06-25 ENCOUNTER — Ambulatory Visit: Payer: Self-pay | Admitting: Family Medicine

## 2023-07-01 ENCOUNTER — Encounter: Payer: Self-pay | Admitting: Family Medicine

## 2023-07-01 ENCOUNTER — Ambulatory Visit (INDEPENDENT_AMBULATORY_CARE_PROVIDER_SITE_OTHER): Payer: Medicare HMO | Admitting: Family Medicine

## 2023-07-01 VITALS — BP 134/78 | HR 69 | Temp 98.6°F | Ht 62.25 in | Wt 189.4 lb

## 2023-07-01 DIAGNOSIS — C50412 Malignant neoplasm of upper-outer quadrant of left female breast: Secondary | ICD-10-CM

## 2023-07-01 DIAGNOSIS — K429 Umbilical hernia without obstruction or gangrene: Secondary | ICD-10-CM

## 2023-07-01 DIAGNOSIS — E538 Deficiency of other specified B group vitamins: Secondary | ICD-10-CM

## 2023-07-01 DIAGNOSIS — R748 Abnormal levels of other serum enzymes: Secondary | ICD-10-CM | POA: Diagnosis not present

## 2023-07-01 DIAGNOSIS — Z0000 Encounter for general adult medical examination without abnormal findings: Secondary | ICD-10-CM | POA: Diagnosis not present

## 2023-07-01 DIAGNOSIS — E1169 Type 2 diabetes mellitus with other specified complication: Secondary | ICD-10-CM

## 2023-07-01 DIAGNOSIS — E66811 Obesity, class 1: Secondary | ICD-10-CM

## 2023-07-01 DIAGNOSIS — E559 Vitamin D deficiency, unspecified: Secondary | ICD-10-CM | POA: Diagnosis not present

## 2023-07-01 DIAGNOSIS — Z170 Estrogen receptor positive status [ER+]: Secondary | ICD-10-CM

## 2023-07-01 DIAGNOSIS — R002 Palpitations: Secondary | ICD-10-CM

## 2023-07-01 DIAGNOSIS — R1900 Intra-abdominal and pelvic swelling, mass and lump, unspecified site: Secondary | ICD-10-CM

## 2023-07-01 DIAGNOSIS — E785 Hyperlipidemia, unspecified: Secondary | ICD-10-CM

## 2023-07-01 DIAGNOSIS — G4762 Sleep related leg cramps: Secondary | ICD-10-CM

## 2023-07-01 DIAGNOSIS — Z7189 Other specified counseling: Secondary | ICD-10-CM

## 2023-07-01 MED ORDER — METFORMIN HCL 500 MG PO TABS
500.0000 mg | ORAL_TABLET | Freq: Two times a day (BID) | ORAL | 4 refills | Status: AC
Start: 2023-07-01 — End: ?

## 2023-07-01 MED ORDER — VITAMIN B-12 1000 MCG SL SUBL: 1.0000 | SUBLINGUAL_TABLET | Freq: Every day | SUBLINGUAL | Status: DC

## 2023-07-01 MED ORDER — METOPROLOL TARTRATE 25 MG PO TABS: 25.0000 mg | ORAL_TABLET | Freq: Two times a day (BID) | ORAL | 3 refills | Status: AC | PRN

## 2023-07-01 MED ORDER — VITAMIN D 50 MCG (2000 UT) PO CAPS: 1.0000 | ORAL_CAPSULE | Freq: Every day | ORAL | Status: AC

## 2023-07-01 MED ORDER — ATORVASTATIN CALCIUM 40 MG PO TABS: 40.0000 mg | ORAL_TABLET | Freq: Every day | ORAL | 4 refills | Status: AC

## 2023-07-01 MED ORDER — PIOGLITAZONE HCL 30 MG PO TABS: 30.0000 mg | ORAL_TABLET | Freq: Every day | ORAL | 4 refills | Status: AC

## 2023-07-01 NOTE — Patient Instructions (Addendum)
 I will order liver ultrasound to evaluate persistently elevated alkaline phosphatase  Increase vitamin D  to 2000 units daily Find dissolvable b12 tablets to take 1000mcg daily.  Return in 6 months for diabetes and liver follow up visit.  Bring us  living will to update your chart

## 2023-07-01 NOTE — Progress Notes (Signed)
 Ph: [phone] Fax: [phone]   Patient ID: Brandy Guerrero, female    DOB: [birth year], 73 y.o.   MRN: [id number]  This visit was conducted in person.  BP 134/78   Pulse 69   Temp 98.6 F (37 C) (Oral)   Ht 5' 2.25" (1.581 m)   Wt 189 lb 6 oz (85.9 kg)   SpO2 98%   BMI 34.36 kg/m    CC: CPE Subjective:   HPI: Brandy Guerrero is a 73 y.o. female presenting on 07/01/2023 for Medicare Wellness   Saw health advisor 05/2023 for medicare wellness visit. Note reviewed.   Hearing Screening   500Hz  1000Hz  2000Hz  4000Hz   Right ear 25 25 25 25   Left ear 20 20 20 20   Vision Screening - Comments:: Last eye exam, 08/2022.  Flowsheet Row Office Visit from 07/01/2023 in [facility]  PHQ-2 Total Score 1          07/01/2023   10:15 AM 06/04/2023   11:03 AM 12/17/2022   10:21 AM 06/02/2022    3:34 PM 05/22/2021   10:19 AM  Fall Risk   Falls in the past year? 0 0 0 1 0  Number falls in past yr:  0  0   Comment    tripped outside   Injury with Fall?  0  0   Risk for fall due to :  No Fall Risks  No Fall Risks   Follow up  Education provided;Falls prevention discussed  Falls prevention discussed;Falls evaluation completed    Worsening nocturnal leg cramps over the past month. No new meds or vitamins or supplements.   Stage Ia left breast cancer (ER/PR positive) diagnosed 07/2020 s/p wide local excision and SNL biopsy Brandy Guerrero) s/p adjuvant whole breast radiation - saw onc and rad/onc. Continue Femara , planned for 5 yrs.   Received bilateral steroid injections 04/2023 ([place]) (triamcinolone  80mg  x2).   DM - she continues metformin  500mg  BID along with actos  15mg  daily. Jardiance  10mg  caused dysuria/UTI treated 12/2021 with diflucan . She stopped jardiance  12/2021. Wants to avoid injectables. Doesn't check sugars due to aversion to fingerstick.  Continues metoprolol  PRN heart flutters.   Rpt pelvic US  12/2021 showed persistence of solid mass to  midline/left pelvis (9x8.7x7.8cm) - overall stable - pt does not want surgery. GYN f/u left PRN.   Preventative: Colonoscopy 03/2022 - 5 TAs, diverticulosis, rpt 3 yrs ([facility]) [facility] at [name] - s/p TAH for fibroids 1970s, one ovary remains. Last seen 10/2021 - f/u open ended.  Mammogram 09/2022 - [facility] DEXA 07/2021 - T score 0.3 LFN Lung cancer screening - not eligible  Flu shot yearly  COVID vaccine Moderna 03/2019, 04/2019, booster 12/2019, bivalent 11/2020 Prevnar-13 - 04/2017, pneumovax 12/2018 Tdap 2013  Hep B shots - completed remotely  Shingrix - discussed, declined Advanced planning - does not have set up. Doesn't know who HCPOA would be - probably daughter Brandy Guerrero, but ideally husband and 2 daughters together. Packet provided previously. Would want to be full code but no prolonged care if terminal condition.  Seat belt use discussed.  Sunscreen use discussed. No changing moles on skin.  Non smoker  Alcohol - rare  Dentist Q6 mo  Eye exam - yearly - glaucoma s/p L eye laser surgery ([facility]) Bowel - no constipation  Bladder - no incontinence    Caffeine: none   Lives with husband, has grown children, outside pets (husband is  deer Therapist, nutritional) Occupation: CNA but currently working at [employer] Activity: starting regular walking - 3 mi about 2d/wk   Diet: good water, fruits/vegetables daily      Relevant past medical, surgical, family and social history reviewed and updated as indicated. Interim medical history since our last visit reviewed. Allergies and medications reviewed and updated. Outpatient Medications Prior to Visit  Medication Sig Dispense Refill   ketoconazole  (NIZORAL ) 2 % shampoo 1-2 times per week lather on scalp, leave on 8-10 minutes, rinse well 120 mL 5   letrozole  (FEMARA ) 2.5 MG tablet Take 1 tablet by mouth once daily 90 tablet 3   minoxidil  (LONITEN ) 2.5 MG tablet TAKE 1 TABLET BY MOUTH ONCE DAILY . APPOINTMENT  REQUIRED FOR FUTURE REFILLS 30 tablet 0   vitamin E 180 MG (400 UNITS) capsule Take 400 Units by mouth daily. Take two by mouth daily     atorvastatin  (LIPITOR) 40 MG tablet Take 1 tablet (40 mg total) by mouth daily. 90 tablet 4   Cholecalciferol (VITAMIN D3) 25 MCG (1000 UT) CAPS Take 1 capsule (1,000 Units total) by mouth daily.     cyanocobalamin  (VITAMIN B12) 1000 MCG tablet Take 1 tablet (1,000 mcg total) by mouth daily.     latanoprost  (XALATAN ) 0.005 % ophthalmic solution Place 1 drop into the left eye at bedtime.     metFORMIN  (GLUCOPHAGE ) 500 MG tablet Take 1 tablet (500 mg total) by mouth 2 (two) times daily with a meal. 180 tablet 4   metoprolol  tartrate (LOPRESSOR ) 25 MG tablet Take 1 tablet (25 mg total) by mouth 2 (two) times daily as needed (heart fluttering). 60 tablet 1   pioglitazone  (ACTOS ) 15 MG tablet Take 1 tablet by mouth once daily 90 tablet 0   clindamycin  (CLEOCIN  T) 1 % external solution Apply 1-2 times daily to scalp as needed for breakouts (Patient not taking: Reported on 07/01/2023) 60 mL 5   Fluocinolone  Acetonide Body 0.01 % OIL APPLY TO SCALP 1-2 TIMES DAILY (Patient not taking: Reported on 07/01/2023) 119 mL 5   fluocinonide  (LIDEX ) 0.05 % external solution Apply 1-2 drops once or twice daily to affected areas on scalp as needed for itching (Patient not taking: Reported on 07/01/2023) 60 mL 2   No facility-administered medications prior to visit.     Per HPI unless specifically indicated in ROS section below Review of Systems  Constitutional:  Negative for activity change, appetite change, chills, fatigue, fever and unexpected weight change.  HENT:  Negative for hearing loss.   Eyes:  Negative for visual disturbance.  Respiratory:  Negative for cough, chest tightness, shortness of breath and wheezing.   Cardiovascular:  Negative for chest pain, palpitations and leg swelling.  Gastrointestinal:  Negative for abdominal distention, abdominal pain, blood in stool,  constipation, diarrhea, nausea and vomiting.  Genitourinary:  Negative for difficulty urinating and hematuria.  Musculoskeletal:  Negative for arthralgias, myalgias and neck pain.  Skin:  Negative for rash.  Neurological:  Negative for dizziness, seizures, syncope and headaches.  Hematological:  Negative for adenopathy. Does not bruise/bleed easily.  Psychiatric/Behavioral:  Negative for dysphoric mood. The patient is not nervous/anxious.     Objective:  BP 134/78   Pulse 69   Temp 98.6 F (37 C) (Oral)   Ht 5' 2.25" (1.581 m)   Wt 189 lb 6 oz (85.9 kg)   SpO2 98%   BMI 34.36 kg/m   Wt Readings from Last 3 Encounters:  07/01/23 189 lb 6 oz (85.9  kg)  06/04/23 191 lb (86.6 kg)  03/18/23 191 lb 4.8 oz (86.8 kg)      Physical Exam Vitals and nursing note reviewed.  Constitutional:      Appearance: Normal appearance. She is not ill-appearing.  HENT:     Head: Normocephalic and atraumatic.     Right Ear: Tympanic membrane, ear canal and external ear normal. There is no impacted cerumen.     Left Ear: Tympanic membrane, ear canal and external ear normal. There is no impacted cerumen.     Mouth/Throat:     Mouth: Mucous membranes are moist.     Pharynx: Oropharynx is clear. No oropharyngeal exudate or posterior oropharyngeal erythema.  Eyes:     General:        Right eye: No discharge.        Left eye: No discharge.     Extraocular Movements: Extraocular movements intact.     Conjunctiva/sclera: Conjunctivae normal.     Pupils: Pupils are equal, round, and reactive to light.  Neck:     Thyroid : No thyroid  mass or thyromegaly.     Vascular: No carotid bruit.  Cardiovascular:     Rate and Rhythm: Normal rate and regular rhythm.     Pulses: Normal pulses.     Heart sounds: Normal heart sounds. No murmur heard. Pulmonary:     Effort: Pulmonary effort is normal. No respiratory distress.     Breath sounds: Normal breath sounds. No wheezing, rhonchi or rales.  Abdominal:      General: Bowel sounds are normal. There is no distension.     Palpations: Abdomen is soft. There is no mass.     Tenderness: There is no abdominal tenderness. There is no guarding or rebound.     Hernia: No hernia is present.     Comments: Unable to palpate pelvic mass   Musculoskeletal:     Cervical back: Normal range of motion and neck supple. No rigidity.     Right lower leg: No edema.     Left lower leg: No edema.  Lymphadenopathy:     Cervical: No cervical adenopathy.  Skin:    General: Skin is warm and dry.     Findings: No rash.  Neurological:     General: No focal deficit present.     Mental Status: She is alert. Mental status is at baseline.  Psychiatric:        Mood and Affect: Mood normal.        Behavior: Behavior normal.       Results for orders placed or performed in visit on 06/24/23  VITAMIN D  25 Hydroxy (Vit-D Deficiency, Fractures)   Collection Time: 06/24/23  9:49 AM  Result Value Ref Range   VITD 28.13 (L) 30.00 - 100.00 ng/mL  Vitamin B12   Collection Time: 06/24/23  9:49 AM  Result Value Ref Range   Vitamin B-12 266 211 - 911 pg/mL  Comprehensive metabolic panel with GFR   Collection Time: 06/24/23  9:49 AM  Result Value Ref Range   Sodium 142 135 - 145 mEq/L   Potassium 4.1 3.5 - 5.1 mEq/L   Chloride 103 96 - 112 mEq/L   CO2 31 19 - 32 mEq/L   Glucose, Bld 162 (H) 70 - 99 mg/dL   BUN 15 6 - 23 mg/dL   Creatinine, Ser 2.13 0.40 - 1.20 mg/dL   Total Bilirubin 0.7 0.2 - 1.2 mg/dL   Alkaline Phosphatase 141 (H) 39 - 117 U/L  AST 14 0 - 37 U/L   ALT 16 0 - 35 U/L   Total Protein 7.0 6.0 - 8.3 g/dL   Albumin 4.3 3.5 - 5.2 g/dL   GFR 84.69 >62.95 mL/min   Calcium  9.6 8.4 - 10.5 mg/dL  Lipid panel   Collection Time: 06/24/23  9:49 AM  Result Value Ref Range   Cholesterol 193 0 - 200 mg/dL   Triglycerides 28.4 0.0 - 149.0 mg/dL   HDL 13.24 >40.10 mg/dL   VLDL 27.2 0.0 - 53.6 mg/dL   LDL Cholesterol 644 (H) 0 - 99 mg/dL   Total CHOL/HDL Ratio 3     NonHDL 136.05   Microalbumin / creatinine urine ratio   Collection Time: 06/24/23  9:49 AM  Result Value Ref Range   Microalb, Ur <0.7 mg/dL   Creatinine,U 03.4 mg/dL   Microalb Creat Ratio Unable to calculate 0.0 - 30.0 mg/g  Hemoglobin A1c   Collection Time: 06/24/23  9:49 AM  Result Value Ref Range   Hgb A1c MFr Bld 7.3 (H) 4.6 - 6.5 %    Assessment & Plan:   Problem List Items Addressed This Visit     Health maintenance examination - Primary (Chronic)   Preventative protocols reviewed and updated unless pt declined. Discussed healthy diet and lifestyle.       Advanced care planning/counseling discussion (Chronic)   Advanced planning - does not have set up. Doesn't know who HCPOA would be - probably daughter Brandy Guerrero, but ideally husband and 2 daughters together. Packet provided previously. Would want to be full code but no prolonged care if terminal condition.       Hyperlipidemia associated with type 2 diabetes mellitus (HCC)   Chronic, continues atorvastatin  20mg  daily. LDL levels above goal <100 in diabetic - consider further titration. The 10-year ASCVD risk score (Arnett DK, et al., 2019) is: 28.9%   Values used to calculate the score:     Age: 20 years     Sex: Female     Is Non-Hispanic African American: Yes     Diabetic: Yes     Tobacco smoker: No     Systolic Blood Pressure: 134 mmHg     Is BP treated: No     HDL Cholesterol: 57.4 mg/dL     Total Cholesterol: 193 mg/dL       Relevant Medications   atorvastatin  (LIPITOR) 40 MG tablet   metFORMIN  (GLUCOPHAGE ) 500 MG tablet   metoprolol  tartrate (LOPRESSOR ) 25 MG tablet   pioglitazone  (ACTOS ) 30 MG tablet   Pelvic mass in female   Chronic, overall unchanged over 10 yrs as of last imaging 2023.  Has declined surgical treatment.  Last saw GYN 12/2021 - rec f/u if desires surgical management.       Palpitations   Carries diagnosis of paroxysmal atrial fibrillation but unclear if has had afib  documented.  She manages this with beta blocker PRN palpitations, sparing use. Metoprolol  refilled.       Relevant Medications   metoprolol  tartrate (LOPRESSOR ) 25 MG tablet   Umbilical hernia   Chronic, overall stable, continues to decline surgical treatment at this time.       Obesity, Class I, BMI 30-34.9   Continue to encourage healthy diet and lifestyle choices to affect sustainable weight loss      Type 2 diabetes mellitus with other specified complication (HCC)   Chronic, A2c above goal at 7.3%.  Continue metformin  500mg  bid, last visit we started actos  15mg  and  she has tolerated well - will increase to 30mg  daily, monitoring for side effects including leg swelling, development of CHF  Caution with steroid injections      Relevant Medications   atorvastatin  (LIPITOR) 40 MG tablet   metFORMIN  (GLUCOPHAGE ) 500 MG tablet   pioglitazone  (ACTOS ) 30 MG tablet   Malignant neoplasm of upper-outer quadrant of left breast in female, estrogen receptor positive (HCC)   Appreciate onc care - continues letrozole  for 5 yrs (started 2022).  Continue yearly mammogram.       Vitamin B12 deficiency   B12 levels remain low normal despite daily replacement - recommend switch to dissolvable sublingual tablets.       Vitamin D  deficiency   Levels remaining mildly low - rec increase replacement to 2000 international units  daily.      Elevated alkaline phosphatase level   Chronic, remaining mildly elevated since ~2022. Denies significant symptoms.  Will update RUQ US .       Relevant Orders   US  ABDOMEN LIMITED RUQ (LIVER/GB)   Nocturnal leg cramps   Worsening over the past month - discussed conservative measures including good hydration status, stretching prior to bedtime, consider trial of magnesium supplementation        Meds ordered this encounter  Medications   atorvastatin  (LIPITOR) 40 MG tablet    Sig: Take 1 tablet (40 mg total) by mouth daily.    Dispense:  90 tablet     Refill:  4   metFORMIN  (GLUCOPHAGE ) 500 MG tablet    Sig: Take 1 tablet (500 mg total) by mouth 2 (two) times daily with a meal.    Dispense:  180 tablet    Refill:  4   metoprolol  tartrate (LOPRESSOR ) 25 MG tablet    Sig: Take 1 tablet (25 mg total) by mouth 2 (two) times daily as needed (heart fluttering).    Dispense:  30 tablet    Refill:  3   pioglitazone  (ACTOS ) 30 MG tablet    Sig: Take 1 tablet (30 mg total) by mouth daily.    Dispense:  90 tablet    Refill:  4   Cholecalciferol (VITAMIN D ) 50 MCG (2000 UT) CAPS    Sig: Take 1 capsule (2,000 Units total) by mouth daily.   Cyanocobalamin  (VITAMIN B-12) 1000 MCG SUBL    Sig: Place 1 tablet (1,000 mcg total) under the tongue daily.    Orders Placed This Encounter  Procedures   US  ABDOMEN LIMITED RUQ (LIVER/GB)    Standing Status:   Future    Expiration Date:   06/30/2024    Reason for Exam (SYMPTOM  OR DIAGNOSIS REQUIRED):   elevated alk phos    Preferred imaging location?:   [facility]    Patient Instructions  I will order liver ultrasound to evaluate persistently elevated alkaline phosphatase  Increase vitamin D  to 2000 units daily Find dissolvable b12 tablets to take 1000mcg daily.  Return in 6 months for diabetes and liver follow up visit.  Bring us  living will to update your chart  Follow up plan: Return in about 6 months (around 01/01/2024) for follow up visit.  Claire Crick, MD

## 2023-07-02 DIAGNOSIS — G4762 Sleep related leg cramps: Secondary | ICD-10-CM | POA: Insufficient documentation

## 2023-07-02 NOTE — Assessment & Plan Note (Addendum)
 Advanced planning - does not have set up. Doesn't know who HCPOA would be - probably daughter Brandy Guerrero, but ideally husband and 2 daughters together. Packet provided previously. Would want to be full code but no prolonged care if terminal condition.

## 2023-07-02 NOTE — Assessment & Plan Note (Addendum)
 B12 levels remain low normal despite daily replacement - recommend switch to dissolvable sublingual tablets.

## 2023-07-02 NOTE — Assessment & Plan Note (Signed)
 Chronic, overall unchanged over 10 yrs as of last imaging 2023.  Has declined surgical treatment.  Last saw GYN 12/2021 - rec f/u if desires surgical management.

## 2023-07-02 NOTE — Assessment & Plan Note (Signed)
 Preventative protocols reviewed and updated unless pt declined. Discussed healthy diet and lifestyle.

## 2023-07-02 NOTE — Assessment & Plan Note (Addendum)
 Chronic, A2c above goal at 7.3%.  Continue metformin  500mg  bid, last visit we started actos  15mg  and she has tolerated well - will increase to 30mg  daily, monitoring for side effects including leg swelling, development of CHF  Caution with steroid injections

## 2023-07-02 NOTE — Assessment & Plan Note (Addendum)
 Carries diagnosis of paroxysmal atrial fibrillation but unclear if has had afib documented.  She manages this with beta blocker PRN palpitations, sparing use. Metoprolol  refilled.

## 2023-07-02 NOTE — Assessment & Plan Note (Signed)
 Levels remaining mildly low - rec increase replacement to 2000 international units  daily.

## 2023-07-02 NOTE — Assessment & Plan Note (Signed)
 Chronic, continues atorvastatin  20mg  daily. LDL levels above goal <100 in diabetic - consider further titration. The 10-year ASCVD risk score (Arnett DK, et al., 2019) is: 28.9%   Values used to calculate the score:     Age: 73 years     Sex: Female     Is Non-Hispanic African American: Yes     Diabetic: Yes     Tobacco smoker: No     Systolic Blood Pressure: 134 mmHg     Is BP treated: No     HDL Cholesterol: 57.4 mg/dL     Total Cholesterol: 193 mg/dL

## 2023-07-02 NOTE — Assessment & Plan Note (Signed)
 Continue to encourage healthy diet and lifestyle choices to affect sustainable weight loss.

## 2023-07-02 NOTE — Assessment & Plan Note (Signed)
 Worsening over the past month - discussed conservative measures including good hydration status, stretching prior to bedtime, consider trial of magnesium supplementation

## 2023-07-02 NOTE — Assessment & Plan Note (Addendum)
 Chronic, remaining mildly elevated since ~2022. Denies significant symptoms.  Will update RUQ US .

## 2023-07-02 NOTE — Assessment & Plan Note (Addendum)
 Appreciate onc care - continues letrozole  for 5 yrs (started 2022).  Continue yearly mammogram.

## 2023-07-02 NOTE — Assessment & Plan Note (Signed)
 Chronic, overall stable, continues to decline surgical treatment at this time.

## 2023-07-09 ENCOUNTER — Ambulatory Visit: Admitting: Dermatology

## 2023-07-09 ENCOUNTER — Encounter: Payer: Self-pay | Admitting: Dermatology

## 2023-07-09 DIAGNOSIS — Z79899 Other long term (current) drug therapy: Secondary | ICD-10-CM

## 2023-07-09 DIAGNOSIS — L219 Seborrheic dermatitis, unspecified: Secondary | ICD-10-CM | POA: Diagnosis not present

## 2023-07-09 DIAGNOSIS — Z7189 Other specified counseling: Secondary | ICD-10-CM

## 2023-07-09 DIAGNOSIS — L739 Follicular disorder, unspecified: Secondary | ICD-10-CM

## 2023-07-09 DIAGNOSIS — L649 Androgenic alopecia, unspecified: Secondary | ICD-10-CM | POA: Diagnosis not present

## 2023-07-09 MED ORDER — MINOXIDIL 2.5 MG PO TABS: 2.5000 mg | ORAL_TABLET | Freq: Every day | ORAL | 3 refills | Status: AC

## 2023-07-09 MED ORDER — CLINDAMYCIN PHOSPHATE 1 % EX SOLN: CUTANEOUS | 5 refills | Status: AC

## 2023-07-09 NOTE — Patient Instructions (Signed)

## 2023-07-09 NOTE — Progress Notes (Signed)
 Follow-Up Visit   Subjective  Brandy Guerrero is a 73 y.o. female who presents for the following: Alopecia f/u, patient has been taking minoxidil  2.5mg  daily but has been out of it for about a month, patient reports some improvement in the beginning and now not much improved. Patient reports she at times gets boils in her scalp. Patient did not check with oncologist Dr. Vinay Gudena at [facility] to see if it was okay to start finasteride or spironolactone.   The patient has spots, moles and lesions to be evaluated, some may be new or changing and the patient may have concern these could be cancer.   The following portions of the chart were reviewed this encounter and updated as appropriate: medications, allergies, medical history  Review of Systems:  No other skin or systemic complaints except as noted in HPI or Assessment and Plan.  Objective  Well appearing patient in no apparent distress; mood and affect are within normal limits.  A focused examination was performed of the following areas: Scalp   Relevant exam findings are noted in the Assessment and Plan.               Assessment & Plan   ANDROGENETIC ALOPECIA (FEMALE PATTERN HAIR LOSS) Exam: Diffuse thinning of the vertex scalp, widening of the midline part with thinning at frontolateral hairline. Significantly improved from 10/2021 photos   Chronic and persistent condition with duration or expected duration over one year. Condition is symptomatic / bothersome to patient. Not to goal, but improving.   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  In women androgenetic alopecia is commonly associated with menopause but may occur any time after puberty.  It causes hair thinning primarily on the crown with widening of the part and temporal hairline recession.  Can use OTC Rogaine  (minoxidil ) 5% solution/foam as directed.  Oral treatments in female patients who have no  contraindication may include : - Low dose oral minoxidil  1.25 - 5mg  daily - Spironolactone 50 - 100mg  bid - Finasteride 2.5 - 5 mg daily Adjunctive therapies include: - Low Level Laser Light Therapy (LLLT) - Platelet-rich plasma injections (PRP) - Hair Transplants or scalp reduction   Treatment Plan: Continue minoxidil  2.5mg  1 tablet daily. Already at max dose. Do not recommend higher dose due to risk of side effects Recent hair loss is likely due to being off minoxidil  for a month Discussed swelling at legs, around heart, or headaches can be more side effects if minoxidil  is increased.   Doses of minoxidil  for hair loss are considered 'low dose'. This is because the doses used for hair loss are much lower than the doses which are used for conditions such as high blood pressure (hypertension). The doses used for hypertension are 10-40mg  per day.  Side effects are uncommon at the low doses (up to 2.5 mg/day) used to treat hair loss. Potential side effects, more commonly seen at higher doses, include: Increase in hair growth (hypertrichosis) elsewhere on face and body Temporary hair shedding upon starting medication which may last up to 4 weeks Ankle swelling, fluid retention, rapid weight gain more than 5 pounds Low blood pressure and feeling lightheaded or dizzy when standing up quickly Fast or irregular heartbeat Headaches  Spironolactone and finasteride may slow hair loss but will not grow new hair. I think the risks outweigh benefits (spiro risk AKI hyperkalemia, finast risk depression suicidality worsen/risk of hormonal cancers). Patient prefers to try these  options if oncologist agrees. Sent message to Dr. 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 Contact us  if expensive   Long term medication management.  Patient  is using long term (months to years) prescription medication  to control their dermatologic condition.  These medications require periodic monitoring to evaluate for efficacy and side effects and may require periodic laboratory monitoring.    ANDROGENIC ALOPECIA   Related Medications minoxidil  (LONITEN ) 2.5 MG tablet Take 1 tablet (2.5 mg total) by mouth daily. SEBORRHEIC DERMATITIS   Related Medications ketoconazole  (NIZORAL ) 2 % shampoo 1-2 times per week lather on scalp, leave on 8-10 minutes, rinse well fluocinonide  (LIDEX ) 0.05 % external solution Apply 1-2 drops once or twice daily to affected areas on scalp as needed for itching clindamycin  (CLEOCIN  T) 1 % external solution Apply 1-2 times daily to scalp as needed for breakouts FOLLICULITIS   COUNSELING AND COORDINATION OF CARE   MEDICATION MANAGEMENT    Return in about 4 months (around 11/09/2023) for w/ Dr. Annette Guerrero, Alopecia.  I, Brandy Guerrero, CMA, am acting as scribe for Harris Liming, MD .   Documentation: I have reviewed the above documentation for accuracy and completeness, and I agree with the above.  Harris Liming, MD

## 2023-07-14 ENCOUNTER — Ambulatory Visit

## 2023-07-29 ENCOUNTER — Ambulatory Visit
Admission: RE | Admit: 2023-07-29 | Discharge: 2023-07-29 | Disposition: A | Discharge status: Home / Self Care | Source: Ambulatory Visit | Attending: Family Medicine | Admitting: Family Medicine

## 2023-07-29 DIAGNOSIS — K824 Cholesterolosis of gallbladder: Secondary | ICD-10-CM | POA: Diagnosis not present

## 2023-07-29 DIAGNOSIS — R748 Abnormal levels of other serum enzymes: Secondary | ICD-10-CM | POA: Insufficient documentation

## 2023-08-04 ENCOUNTER — Ambulatory Visit: Payer: Self-pay | Admitting: Family Medicine

## 2023-08-04 DIAGNOSIS — K824 Cholesterolosis of gallbladder: Secondary | ICD-10-CM | POA: Insufficient documentation

## 2023-08-10 ENCOUNTER — Ambulatory Visit: Admitting: Dermatology

## 2023-08-26 DIAGNOSIS — E119 Type 2 diabetes mellitus without complications: Secondary | ICD-10-CM | POA: Diagnosis not present

## 2023-08-26 DIAGNOSIS — H40052 Ocular hypertension, left eye: Secondary | ICD-10-CM | POA: Diagnosis not present

## 2023-08-26 DIAGNOSIS — H2513 Age-related nuclear cataract, bilateral: Secondary | ICD-10-CM | POA: Diagnosis not present

## 2023-08-28 ENCOUNTER — Other Ambulatory Visit: Payer: Self-pay | Admitting: Hematology and Oncology

## 2023-09-10 ENCOUNTER — Ambulatory Visit: Payer: Self-pay

## 2023-09-10 NOTE — Telephone Encounter (Signed)
 FYI Only or Action Required?: FYI only for provider.  Patient was last seen in primary care on 07/01/2023 by Rilla Baller, MD.  Called Nurse Triage reporting Appointment Scheduling.  Symptoms began several days ago.  Interventions attempted: Rest, hydration, or home remedies.  Symptoms are: gradually improving.  Triage Disposition: Information or Advice Only Call  Patient/caregiver understands and will follow disposition?: Yes  **Rescheduled 8/6 appt. To 8/8**         Reason for Disposition  Health information question, no triage required and triager able to answer question  Answer Assessment - Initial Assessment Questions 1. REASON FOR CALL: What is the main reason for your call? or How can I best help you?   Patient calling to reschedule 8/6 appt. As it will conflict with her diabetic eye appointment. She was successfully rescheduled for 8/8. She notes her ankle swelling has greatly improved.  Protocols used: Information Only Call - No Triage-A-AH

## 2023-09-16 ENCOUNTER — Ambulatory Visit: Admitting: Family Medicine

## 2023-09-18 ENCOUNTER — Ambulatory Visit: Admitting: Family Medicine

## 2023-09-30 ENCOUNTER — Encounter: Payer: Self-pay | Admitting: Family Medicine

## 2023-09-30 ENCOUNTER — Ambulatory Visit (INDEPENDENT_AMBULATORY_CARE_PROVIDER_SITE_OTHER): Admitting: Family Medicine

## 2023-09-30 VITALS — BP 118/78 | HR 68 | Temp 98.7°F | Ht 62.25 in | Wt 194.1 lb

## 2023-09-30 DIAGNOSIS — R7401 Elevation of levels of liver transaminase levels: Secondary | ICD-10-CM | POA: Diagnosis not present

## 2023-09-30 DIAGNOSIS — E785 Hyperlipidemia, unspecified: Secondary | ICD-10-CM

## 2023-09-30 DIAGNOSIS — R748 Abnormal levels of other serum enzymes: Secondary | ICD-10-CM | POA: Diagnosis not present

## 2023-09-30 DIAGNOSIS — R600 Localized edema: Secondary | ICD-10-CM | POA: Insufficient documentation

## 2023-09-30 DIAGNOSIS — E1169 Type 2 diabetes mellitus with other specified complication: Secondary | ICD-10-CM

## 2023-09-30 DIAGNOSIS — Z7984 Long term (current) use of oral hypoglycemic drugs: Secondary | ICD-10-CM | POA: Diagnosis not present

## 2023-09-30 DIAGNOSIS — E538 Deficiency of other specified B group vitamins: Secondary | ICD-10-CM | POA: Diagnosis not present

## 2023-09-30 DIAGNOSIS — K824 Cholesterolosis of gallbladder: Secondary | ICD-10-CM | POA: Diagnosis not present

## 2023-09-30 LAB — CBC WITH DIFFERENTIAL/PLATELET
Basophils Absolute: 0.1 K/uL (ref 0.0–0.1)
Basophils Relative: 0.9 % (ref 0.0–3.0)
Eosinophils Absolute: 0.1 K/uL (ref 0.0–0.7)
Eosinophils Relative: 1.9 % (ref 0.0–5.0)
HCT: 36.6 % (ref 36.0–46.0)
Hemoglobin: 11.8 g/dL — ABNORMAL LOW (ref 12.0–15.0)
Lymphocytes Relative: 20.9 % (ref 12.0–46.0)
Lymphs Abs: 1.2 K/uL (ref 0.7–4.0)
MCHC: 32.1 g/dL (ref 30.0–36.0)
MCV: 94.1 fl (ref 78.0–100.0)
Monocytes Absolute: 0.5 K/uL (ref 0.1–1.0)
Monocytes Relative: 7.9 % (ref 3.0–12.0)
Neutro Abs: 3.9 K/uL (ref 1.4–7.7)
Neutrophils Relative %: 68.4 % (ref 43.0–77.0)
Platelets: 268 K/uL (ref 150.0–400.0)
RBC: 3.89 Mil/uL (ref 3.87–5.11)
RDW: 13.2 % (ref 11.5–15.5)
WBC: 5.8 K/uL (ref 4.0–10.5)

## 2023-09-30 LAB — HEMOGLOBIN A1C: Hgb A1c MFr Bld: 6.5 % (ref 4.6–6.5)

## 2023-09-30 LAB — COMPREHENSIVE METABOLIC PANEL WITH GFR
ALT: 11 U/L (ref 0–35)
AST: 12 U/L (ref 0–37)
Albumin: 4 g/dL (ref 3.5–5.2)
Alkaline Phosphatase: 117 U/L (ref 39–117)
BUN: 15 mg/dL (ref 6–23)
CO2: 30 meq/L (ref 19–32)
Calcium: 9.2 mg/dL (ref 8.4–10.5)
Chloride: 104 meq/L (ref 96–112)
Creatinine, Ser: 0.71 mg/dL (ref 0.40–1.20)
GFR: 85.28 mL/min (ref >60.00)
Glucose, Bld: 151 mg/dL — ABNORMAL HIGH (ref 70–99)
Potassium: 4.4 meq/L (ref 3.5–5.1)
Sodium: 141 meq/L (ref 135–145)
Total Bilirubin: 0.5 mg/dL (ref 0.2–1.2)
Total Protein: 6.7 g/dL (ref 6.0–8.3)

## 2023-09-30 LAB — GAMMA GT: GGT: 13 U/L (ref 7–51)

## 2023-09-30 LAB — BRAIN NATRIURETIC PEPTIDE: Pro B Natriuretic peptide (BNP): 12 pg/mL (ref 0.0–100.0)

## 2023-09-30 LAB — TSH: TSH: 1.42 u[IU]/mL (ref 0.35–5.50)

## 2023-09-30 LAB — VITAMIN B12: Vitamin B-12: >1500 pg/mL — ABNORMAL HIGH (ref 211–911)

## 2023-09-30 NOTE — Assessment & Plan Note (Signed)
 Chronic on metformin  and actos  30mg . Update A1c. Foot exam today  Upcoming diabetic eye exam scheduled for next month.

## 2023-09-30 NOTE — Assessment & Plan Note (Addendum)
 Weight gain noted. Encouraged healthy diet and lifestyle changes to effect sustainable weight loss.   Obesity complicated by diabetes, hyperlipidemia.

## 2023-09-30 NOTE — Assessment & Plan Note (Addendum)
 Update levels on regular oral replacement.

## 2023-09-30 NOTE — Addendum Note (Signed)
 Addended by: RILLA BALLER on: 09/30/2023 10:38 AM   Modules accepted: Level of Service

## 2023-09-30 NOTE — Patient Instructions (Addendum)
 Legs/ankles looking ok today  Labs today Limit salt/sodium, elevate legs. Possibly medicine related (Actos ). Let us  know if recurrent issue.  Keep scheduled follow up in November

## 2023-09-30 NOTE — Progress Notes (Addendum)
 Ph: [phone] Fax: [phone]   Patient ID: Brandy Guerrero, female    DOB: 1950-02-17, 73 y.o.   MRN: [id number]  This visit was conducted in person.  BP 118/78   Pulse 68   Temp 98.7 F (37.1 C) (Oral)   Ht 5' 2.25 (1.581 m)   Wt 194 lb 2 oz (88.1 kg)   SpO2 97%   BMI 35.22 kg/m    CC: ankle swelling  Subjective:   HPI: Brandy Guerrero is a 73 y.o. female presenting on 09/30/2023 for Joint Swelling (Pt C/o Both ankles swollen.   Started 2-3 mths ago)   2-3 month history of bilateral ankle swelling associated with dark skin discoloration.  She actually notes this has improved.  No dyspnea, chest pain, abd pain, nausea, orthopnea, bone pain.  May have had more salt around that time.  She had recently taken trip to [place] to visit daughter.   She is on actos  30mg  daily for diabetes (dose increased 03/2023) as well as metformin . Jardiance  stopped due to burning to groin.  No known history of kidney disease.  H/o elevated ALP with 7mm gallbladder polyp and adenomyomatosis on latest RUQ abd US  07/2023 - rec rpt US  6 months to monitor this.   Rpt pelvic US  12/2021 showed persistence of solid mass to midline/left pelvis (9x8.7x7.8cm) - overall stable - pt does not want surgery. GYN f/u left PRN.   Rescheduled diabetic eye exam to next month.      Relevant past medical, surgical, family and social history reviewed and updated as indicated. Interim medical history since our last visit reviewed. Allergies and medications reviewed and updated. Outpatient Medications Prior to Visit  Medication Sig Dispense Refill   atorvastatin  (LIPITOR) 40 MG tablet Take 1 tablet (40 mg total) by mouth daily. 90 tablet 4   azithromycin  (ZITHROMAX ) 250 MG tablet Take by mouth.     Cholecalciferol (VITAMIN D ) 50 MCG (2000 UT) CAPS Take 1 capsule (2,000 Units total) by mouth daily.     clindamycin  (CLEOCIN  T) 1 % external solution Apply 1-2 times daily to scalp as needed for breakouts 60 mL  5   Fluocinolone  Acetonide Body 0.01 % OIL APPLY TO SCALP 1-2 TIMES DAILY 119 mL 5   fluocinonide  (LIDEX ) 0.05 % external solution Apply 1-2 drops once or twice daily to affected areas on scalp as needed for itching 60 mL 2   ketoconazole  (NIZORAL ) 2 % shampoo 1-2 times per week lather on scalp, leave on 8-10 minutes, rinse well 120 mL 5   letrozole  (FEMARA ) 2.5 MG tablet Take 1 tablet by mouth once daily 90 tablet 0   metFORMIN  (GLUCOPHAGE ) 500 MG tablet Take 1 tablet (500 mg total) by mouth 2 (two) times daily with a meal. 180 tablet 4   metoprolol  tartrate (LOPRESSOR ) 25 MG tablet Take 1 tablet (25 mg total) by mouth 2 (two) times daily as needed (heart fluttering). 30 tablet 3   minoxidil  (LONITEN ) 2.5 MG tablet Take 1 tablet (2.5 mg total) by mouth daily. 30 tablet 3   pioglitazone  (ACTOS ) 30 MG tablet Take 1 tablet (30 mg total) by mouth daily. 90 tablet 4   vitamin E 180 MG (400 UNITS) capsule Take 400 Units by mouth daily. Take two by mouth daily     Cyanocobalamin  (VITAMIN B-12) 1000 MCG SUBL Place 1 tablet (1,000 mcg total) under the tongue daily.     No facility-administered medications prior to visit.     Per HPI  unless specifically indicated in ROS section below Review of Systems  Objective:  BP 118/78   Pulse 68   Temp 98.7 F (37.1 C) (Oral)   Ht 5' 2.25 (1.581 m)   Wt 194 lb 2 oz (88.1 kg)   SpO2 97%   BMI 35.22 kg/m   Wt Readings from Last 3 Encounters:  09/30/23 194 lb 2 oz (88.1 kg)  07/01/23 189 lb 6 oz (85.9 kg)  06/04/23 191 lb (86.6 kg)      Physical Exam Vitals and nursing note reviewed.  Constitutional:      Appearance: Normal appearance.  HENT:     Mouth/Throat:     Mouth: Mucous membranes are moist.     Pharynx: Oropharynx is clear. No oropharyngeal exudate or posterior oropharyngeal erythema.  Cardiovascular:     Rate and Rhythm: Normal rate and regular rhythm.     Pulses: Normal pulses.     Heart sounds: Normal heart sounds. No murmur  heard. Pulmonary:     Effort: Pulmonary effort is normal. No respiratory distress.     Breath sounds: Normal breath sounds. No wheezing, rhonchi or rales.  Abdominal:     General: Bowel sounds are normal. There is no distension.     Palpations: Abdomen is soft. There is no mass.     Tenderness: There is no abdominal tenderness. There is no guarding or rebound.     Hernia: No hernia is present.  Musculoskeletal:        General: No swelling or tenderness.     Right lower leg: Edema (tr) present.     Left lower leg: Edema (tr) present.     Comments:  See HPI for foot exam if done 2+ DP bilaterally  Skin:    General: Skin is warm and dry.     Findings: No rash.  Neurological:     Mental Status: She is alert.  Psychiatric:        Mood and Affect: Mood normal.        Behavior: Behavior normal.       Results for orders placed or performed in visit on 09/30/23  Comprehensive metabolic panel with GFR   Collection Time: 09/30/23  9:50 AM  Result Value Ref Range   Sodium 141 135 - 145 mEq/L   Potassium 4.4 3.5 - 5.1 mEq/L   Chloride 104 96 - 112 mEq/L   CO2 30 19 - 32 mEq/L   Glucose, Bld 151 (H) 70 - 99 mg/dL   BUN 15 6 - 23 mg/dL   Creatinine, Ser 9.28 0.40 - 1.20 mg/dL   Total Bilirubin 0.5 0.2 - 1.2 mg/dL   Alkaline Phosphatase 117 39 - 117 U/L   AST 12 0 - 37 U/L   ALT 11 0 - 35 U/L   Total Protein 6.7 6.0 - 8.3 g/dL   Albumin 4.0 3.5 - 5.2 g/dL   GFR 14.71 >39.99 mL/min   Calcium  9.2 8.4 - 10.5 mg/dL  TSH   Collection Time: 09/30/23  9:50 AM  Result Value Ref Range   TSH 1.42 0.35 - 5.50 uIU/mL  CBC with Differential/Platelet   Collection Time: 09/30/23  9:50 AM  Result Value Ref Range   WBC 5.8 4.0 - 10.5 K/uL   RBC 3.89 3.87 - 5.11 Mil/uL   Hemoglobin 11.8 (L) 12.0 - 15.0 g/dL   HCT 63.3 63.9 - 53.9 %   MCV 94.1 78.0 - 100.0 fl   MCHC 32.1 30.0 - 36.0 g/dL  RDW 13.2 11.5 - 15.5 %   Platelets 268.0 150.0 - 400.0 K/uL   Neutrophils Relative % 68.4 43.0 - 77.0  %   Lymphocytes Relative 20.9 12.0 - 46.0 %   Monocytes Relative 7.9 3.0 - 12.0 %   Eosinophils Relative 1.9 0.0 - 5.0 %   Basophils Relative 0.9 0.0 - 3.0 %   Neutro Abs 3.9 1.4 - 7.7 K/uL   Lymphs Abs 1.2 0.7 - 4.0 K/uL   Monocytes Absolute 0.5 0.1 - 1.0 K/uL   Eosinophils Absolute 0.1 0.0 - 0.7 K/uL   Basophils Absolute 0.1 0.0 - 0.1 K/uL  Vitamin B12   Collection Time: 09/30/23  9:50 AM  Result Value Ref Range   Vitamin B-12 >1500 (H) 211 - 911 pg/mL  Hemoglobin A1c   Collection Time: 09/30/23  9:50 AM  Result Value Ref Range   Hgb A1c MFr Bld 6.5 4.6 - 6.5 %  Brain natriuretic peptide   Collection Time: 09/30/23  9:50 AM  Result Value Ref Range   Pro B Natriuretic peptide (BNP) 12.0 0.0 - 100.0 pg/mL  Gamma GT   Collection Time: 09/30/23  9:50 AM  Result Value Ref Range   GGT 13 7 - 51 U/L  Alkaline Phosphatase, Isoenzymes   Collection Time: 09/30/23  9:52 AM  Result Value Ref Range   Alkaline Phosphatase 134 (H) 44 - 121 IU/L   LIVER FRACTION 67 18 - 85 %   BONE FRACTION 33 14 - 68 %   INTESTINAL FRAC. 0 0 - 18 %   Lab Results  Component Value Date   TSH 1.42 09/30/2023    Assessment & Plan:   Problem List Items Addressed This Visit     Hyperlipidemia associated with type 2 diabetes mellitus (HCC)   Severe obesity (BMI 35.0-39.9) with comorbidity (HCC)   Weight gain noted. Encouraged healthy diet and lifestyle changes to effect sustainable weight loss.   Obesity complicated by diabetes, hyperlipidemia.       Type 2 diabetes mellitus with other specified complication (HCC)   Chronic on metformin  and actos  30mg . Update A1c. Foot exam today  Upcoming diabetic eye exam scheduled for next month.       Relevant Orders   Vitamin B12 (Completed)   Hemoglobin A1c (Completed)   Vitamin B12 deficiency   Update levels on regular oral replacement.       Relevant Orders   Vitamin B12 (Completed)   Elevated alkaline phosphatase level   Chronically elevated since  ~2022. RUQ abd US  07/2023 showing gallbladder polyp and adenomyomatosis - will rpt liver US  in 6 mo.  Update ALP, with isoenzymes.  No bone pain      Relevant Orders   Comprehensive metabolic panel with GFR (Completed)   Alkaline Phosphatase, Isoenzymes (Completed)   Gamma GT (Completed)   Pedal edema - Primary   Newly noted in setting of increased actos  dose and recent trip to [place].  Suspect med related.  Update kidney and liver function as well as BNP although doesn't have CHF symptoms.  To let us  know if recurrent issue to consider decreasing actos  dose.       Relevant Orders   Comprehensive metabolic panel with GFR (Completed)   TSH (Completed)   CBC with Differential/Platelet (Completed)   Brain natriuretic peptide (Completed)     No orders of the defined types were placed in this encounter.   Orders Placed This Encounter  Procedures   Comprehensive metabolic panel with GFR  TSH   CBC with Differential/Platelet   Vitamin B12   Hemoglobin A1c   Brain natriuretic peptide   Alkaline Phosphatase, Isoenzymes   Gamma GT    Patient Instructions  Legs/ankles looking ok today  Labs today Limit salt/sodium, elevate legs. Possibly medicine related (Actos ). Let us  know if recurrent issue.  Keep scheduled follow up in November  Follow up plan: No follow-ups on file.  Anton Blas, MD

## 2023-09-30 NOTE — Assessment & Plan Note (Addendum)
 Newly noted in setting of increased actos  dose and recent trip to [place].  Suspect med related.  Update kidney and liver function as well as BNP although doesn't have CHF symptoms.  To let us  know if recurrent issue to consider decreasing actos  dose.

## 2023-09-30 NOTE — Assessment & Plan Note (Addendum)
 Chronically elevated since ~2022. RUQ abd US  07/2023 showing gallbladder polyp and adenomyomatosis - will rpt liver US  in 6 mo.  Update ALP, with isoenzymes.  No bone pain

## 2023-10-01 ENCOUNTER — Telehealth: Payer: Self-pay

## 2023-10-01 NOTE — Telephone Encounter (Signed)
 Copied from CRM #[id number]. Topic: Clinical - Lab/Test Results >> Oct 01, 2023  2:15 PM Mercedes MATSU wrote: Reason for CRM: Patient called in to get her lab results, she had them drawn yesterday and is hoping they're available to discuss. Patient can be reached at [phone].

## 2023-10-01 NOTE — Telephone Encounter (Signed)
 Patient called in advised her that Dr. KANDICE has not reviewed lab results yet and we will reach out once he does.

## 2023-10-01 NOTE — Telephone Encounter (Signed)
 Unable to reach patient. Voicemail is full.  Lab results have come back, Dr. KANDICE is out of the office today. We will reach out to patient with Dr. Talmadge comments and recommendations once he reviews.

## 2023-10-04 ENCOUNTER — Ambulatory Visit: Payer: Self-pay | Admitting: Family Medicine

## 2023-10-04 LAB — ALKALINE PHOSPHATASE, ISOENZYMES
Alkaline Phosphatase: 134 IU/L — ABNORMAL HIGH (ref 44–121)
BONE FRACTION: 33 % (ref 14–68)
INTESTINAL FRAC.: 0 % (ref 0–18)
LIVER FRACTION: 67 % (ref 18–85)

## 2023-10-04 MED ORDER — VITAMIN B-12 1000 MCG SL SUBL: 1.0000 | SUBLINGUAL_TABLET | SUBLINGUAL | Status: DC

## 2023-10-06 NOTE — Telephone Encounter (Signed)
 Left message to return call to office. Ok to [employer] below.

## 2023-10-07 DIAGNOSIS — Z03818 Encounter for observation for suspected exposure to other biological agents ruled out: Secondary | ICD-10-CM | POA: Diagnosis not present

## 2023-10-07 DIAGNOSIS — U071 COVID-19: Secondary | ICD-10-CM | POA: Diagnosis not present

## 2023-10-07 DIAGNOSIS — R051 Acute cough: Secondary | ICD-10-CM | POA: Diagnosis not present

## 2023-10-14 ENCOUNTER — Other Ambulatory Visit (HOSPITAL_BASED_OUTPATIENT_CLINIC_OR_DEPARTMENT_OTHER): Payer: Self-pay

## 2023-10-14 ENCOUNTER — Ambulatory Visit (HOSPITAL_BASED_OUTPATIENT_CLINIC_OR_DEPARTMENT_OTHER): Admitting: Orthopaedic Surgery

## 2023-10-14 DIAGNOSIS — G8929 Other chronic pain: Secondary | ICD-10-CM | POA: Diagnosis not present

## 2023-10-14 DIAGNOSIS — M25562 Pain in left knee: Secondary | ICD-10-CM | POA: Diagnosis not present

## 2023-10-14 MED ORDER — TRIAMCINOLONE ACETONIDE 40 MG/ML IJ SUSP
80.0000 mg | INTRAMUSCULAR | Status: AC | PRN
  Administered 2023-10-14: 80 mg via INTRA_ARTICULAR

## 2023-10-14 MED ORDER — LIDOCAINE HCL 1 % IJ SOLN
4.0000 mL | INTRAMUSCULAR | Status: AC | PRN
Start: 2023-10-14 — End: 2023-10-14
  Administered 2023-10-14: 4 mL

## 2023-10-14 NOTE — Progress Notes (Signed)
 Chief Complaint: Left worse than right knee pain     History of Present Illness:   10/14/2023: Presents today for follow-up requesting a left knee injection.  Brandy Guerrero is a 73 y.o. female today with left worse than right knee pain.  She states that she is here today for further discussion.  She has had a previous injection as well as hyaluronic acid gel injection in the left knee.  She has never had any treatments or injections on the right knee.  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  She is on her feet essentially all day.  She is taking care of her elderly mother.    PMH/PSH/Family History/Social History/Meds/Allergies:    Past Medical History:  Diagnosis Date  . Acne    dermatology - on doxy 100mg  daily  . Anemia   . Breast cancer (HCC)   . Bursitis of right shoulder   . COVID-19 02/12/2022  . History of atrial fibrillation 05/11/2005   h/o parox Afib, nl cardiolite/echo, no prolonged episodes.  metoprolol  prn [place])  . HLD (hyperlipidemia)   . Obesity   . Panic attacks   . Positive occult stool blood test 02/11/2012   s/p normal colonoscopy, rpt stool cards negative   Past Surgical History:  Procedure Laterality Date  . ABDOMINAL HYSTERECTOMY  1990s   for fibroids, has one ovary remaining ([place]). partial  . BREAST BIOPSY    . BREAST LUMPECTOMY Left 09/26/2020  . BREAST LUMPECTOMY WITH RADIOACTIVE SEED AND SENTINEL LYMPH NODE BIOPSY Left 09/26/2020   Procedure: LEFT BREAST LUMPECTOMY WITH RADIOACTIVE SEED AND SENTINEL LYMPH NODE BIOPSY;  Surgeon: Curvin Deward MOULD, MD;  Location: [facility] OR;  Service: General;  Laterality: Left;  . COLONOSCOPY  01/2012   mod diverticulosis, rec rpt stool cards and if + rec EGD (returned negative), rpt colonoscopy 10 yrs  . COLONOSCOPY  03/2022   5 TAs, diverticulosis, rpt 3 yrs ([place])  . GLAUCOMA SURGERY Left 04/2023   Trabeculoplasty ([place])   Social History   Socioeconomic History  .  Marital status: Married    Spouse name: Not on file  . Number of children: Not on file  . Years of education: Not on file  . Highest education level: Not on file  Occupational History  . Not on file  Tobacco Use  . Smoking status: Never  . Smokeless tobacco: Never  Vaping Use  . Vaping status: Never Used  Substance and Sexual Activity  . Alcohol use: Yes    Alcohol/week: 0.0 standard drinks of alcohol    Comment: Rare  . Drug use: No  . Sexual activity: Yes    Birth control/protection: Surgical    Comment: hysterectomy  Other Topics Concern  . Not on file  Social History Narrative   Caffeine: none    Lives with husband, has grown children, outside pets (husband is deer Therapist, nutritional)    Occupation: CNA at [employer], works 2nd shift, [employer] on weekends    Activity: starting regular walking - 3 mi about 2d/wk    Diet: good water, fruits/vegetables daily    Social Drivers of Corporate investment banker Strain: Low Risk  (06/04/2023)   Overall Financial Resource Strain (CARDIA)   . Difficulty of Paying Living Expenses: Not hard at all  Food Insecurity: No Food Insecurity (06/04/2023)   Hunger Vital Sign   . Worried About Programme researcher, broadcasting/film/video in the Last Year: Never true   .  Ran Out of Food in the Last Year: Never true  Transportation Needs: No Transportation Needs (06/04/2023)   PRAPARE - Transportation   . Lack of Transportation (Medical): No   . Lack of Transportation (Non-Medical): No  Physical Activity: Sufficiently Active (06/04/2023)   Exercise Vital Sign   . Days of Exercise per Week: 5 days   . Minutes of Exercise per Session: 30 min  Stress: No Stress Concern Present (06/04/2023)   [employer] of Occupational Health - Occupational Stress Questionnaire   . Feeling of Stress : Not at all  Social Connections: Moderately Integrated (06/04/2023)   Social Connection and Isolation Panel   . Frequency of Communication with Friends and Family: More than three times a  week   . Frequency of Social Gatherings with Friends and Family: Once a week   . Attends Religious Services: More than 4 times per year   . Active Member of Clubs or Organizations: No   . Attends Banker Meetings: Never   . Marital Status: Married   Family History  Problem Relation Age of Onset  . Hypertension Mother   . Alcohol abuse Father   . Alcohol abuse Brother   . Diabetes Daughter   . Cancer Neg Hx   . Coronary artery disease Neg Hx   . Stroke Neg Hx   . Colon cancer Neg Hx   . Colon polyps Neg Hx   . Esophageal cancer Neg Hx   . Rectal cancer Neg Hx   . Stomach cancer Neg Hx    Allergies  Allergen Reactions  . Sulfa Antibiotics Rash   Current Outpatient Medications  Medication Sig Dispense Refill  . atorvastatin  (LIPITOR) 40 MG tablet Take 1 tablet (40 mg total) by mouth daily. 90 tablet 4  . azithromycin  (ZITHROMAX ) 250 MG tablet Take by mouth.    . Cholecalciferol (VITAMIN D ) 50 MCG (2000 UT) CAPS Take 1 capsule (2,000 Units total) by mouth daily.    . clindamycin  (CLEOCIN  T) 1 % external solution Apply 1-2 times daily to scalp as needed for breakouts 60 mL 5  . Cyanocobalamin  (VITAMIN B-12) 1000 MCG SUBL Place 1 tablet (1,000 mcg total) under the tongue once a week.    . Fluocinolone  Acetonide Body 0.01 % OIL APPLY TO SCALP 1-2 TIMES DAILY 119 mL 5  . fluocinonide  (LIDEX ) 0.05 % external solution Apply 1-2 drops once or twice daily to affected areas on scalp as needed for itching 60 mL 2  . ketoconazole  (NIZORAL ) 2 % shampoo 1-2 times per week lather on scalp, leave on 8-10 minutes, rinse well 120 mL 5  . letrozole  (FEMARA ) 2.5 MG tablet Take 1 tablet by mouth once daily 90 tablet 0  . metFORMIN  (GLUCOPHAGE ) 500 MG tablet Take 1 tablet (500 mg total) by mouth 2 (two) times daily with a meal. 180 tablet 4  . metoprolol  tartrate (LOPRESSOR ) 25 MG tablet Take 1 tablet (25 mg total) by mouth 2 (two) times daily as needed (heart fluttering). 30 tablet 3  .  minoxidil  (LONITEN ) 2.5 MG tablet Take 1 tablet (2.5 mg total) by mouth daily. 30 tablet 3  . pioglitazone  (ACTOS ) 30 MG tablet Take 1 tablet (30 mg total) by mouth daily. 90 tablet 4  . vitamin E 180 MG (400 UNITS) capsule Take 400 Units by mouth daily. Take two by mouth daily     No current facility-administered medications for this visit.   No results found.  Review of Systems:   A  ROS was performed including pertinent positives and negatives as documented in the HPI.  Physical Exam :   Constitutional: NAD and appears stated age Neurological: Alert and oriented Psych: Appropriate affect and cooperative There were no vitals taken for this visit.   Comprehensive Musculoskeletal Exam:    Bilateral tenderness to palpation about the medial joint line.  Positive crepitus medially.  No tenderness about the patellofemoral or lateral joint lines bilaterally.  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   Assessment and Plan:   73 y.o. female with medial compartment osteoarthritis of both knees.  At today's visit I did discuss that given the fact she has not had injections in quite some time and she is overall quite active I would start initially with bilateral ultrasound-guided injections of the knees that we can hopefully get her some relief.  Presents today requesting a left knee ultrasound-guided injection.  This was provided after verbal consent was obtained  - Left knee injection provided after verbal consent obtained     Procedure Note  Patient: Brandy Guerrero             Date of Birth: December 18, 1950           MRN: [id number]             Visit Date: 10/14/2023  Procedures: Visit Diagnoses:  1. Chronic pain of left knee     Large Joint Inj: L knee on 10/14/2023 1:47 PM Indications: pain Details: 22  G 1.5 in needle, ultrasound-guided anterior approach  Arthrogram: No  Medications: 4 mL lidocaine  1 %; 80 mg triamcinolone  acetonide 40 MG/ML Outcome: tolerated well, no immediate complications Procedure, treatment alternatives, risks and benefits explained, specific risks discussed. Consent was given by the patient. Immediately prior to procedure a time out was called to verify the correct patient, procedure, equipment, support staff and site/side marked as required. Patient was prepped and draped in the usual sterile fashion.       I personally saw and evaluated the patient, and participated in the management and treatment plan.  Elspeth Parker, MD Attending Physician, Orthopedic Surgery  This document was dictated using Dragon voice recognition software. A reasonable attempt at proof reading has been made to minimize errors.

## 2023-10-15 ENCOUNTER — Telehealth: Payer: Self-pay | Admitting: Family Medicine

## 2023-10-15 NOTE — Telephone Encounter (Signed)
 Call was regarding lab results. See lab note for more information.

## 2023-10-15 NOTE — Telephone Encounter (Signed)
 Copied from CRM [id number]. Topic: General - Call Back - No Documentation >> Oct 15, 2023 10:12 AM [name] wrote: Reason for CRM: Patient is returning call she is not sure when it was made or who it was from, ckd the patient chart and did not see any notes. Please reach out to patient at number on chart.

## 2023-10-23 DIAGNOSIS — H40052 Ocular hypertension, left eye: Secondary | ICD-10-CM | POA: Diagnosis not present

## 2023-10-23 DIAGNOSIS — H2513 Age-related nuclear cataract, bilateral: Secondary | ICD-10-CM | POA: Diagnosis not present

## 2023-10-23 DIAGNOSIS — E119 Type 2 diabetes mellitus without complications: Secondary | ICD-10-CM | POA: Diagnosis not present

## 2023-11-10 ENCOUNTER — Ambulatory Visit: Admitting: Dermatology

## 2023-11-25 ENCOUNTER — Other Ambulatory Visit: Payer: Self-pay | Admitting: Family Medicine

## 2023-11-25 DIAGNOSIS — Z1231 Encounter for screening mammogram for malignant neoplasm of breast: Secondary | ICD-10-CM

## 2023-11-25 DIAGNOSIS — Z0000 Encounter for general adult medical examination without abnormal findings: Secondary | ICD-10-CM

## 2023-12-02 ENCOUNTER — Telehealth: Payer: Self-pay | Admitting: Orthopaedic Surgery

## 2023-12-02 NOTE — Telephone Encounter (Signed)
 Patient called and said that she is ready to discuss surgery. CB#[phone]

## 2023-12-07 ENCOUNTER — Ambulatory Visit: Admitting: Dermatology

## 2023-12-09 ENCOUNTER — Ambulatory Visit: Payer: Medicare HMO | Admitting: Radiation Oncology

## 2023-12-11 ENCOUNTER — Other Ambulatory Visit (HOSPITAL_BASED_OUTPATIENT_CLINIC_OR_DEPARTMENT_OTHER): Payer: Self-pay | Admitting: Orthopaedic Surgery

## 2023-12-11 ENCOUNTER — Encounter (HOSPITAL_BASED_OUTPATIENT_CLINIC_OR_DEPARTMENT_OTHER): Payer: Self-pay

## 2023-12-11 ENCOUNTER — Telehealth: Payer: Self-pay | Admitting: Orthopaedic Surgery

## 2023-12-11 ENCOUNTER — Ambulatory Visit (HOSPITAL_BASED_OUTPATIENT_CLINIC_OR_DEPARTMENT_OTHER): Admitting: Orthopaedic Surgery

## 2023-12-11 DIAGNOSIS — G8929 Other chronic pain: Secondary | ICD-10-CM

## 2023-12-11 NOTE — Telephone Encounter (Signed)
 Pt request to be worked in for a sooner appointment ASAP

## 2023-12-11 NOTE — Telephone Encounter (Signed)
 Spoke with Patient. Offered 8:30am appointment with Dr. Vernetta on 11/24, patient did not want that early. Also offered 11/17 appointment with Tory, patient only wants to see Dr. Vernetta. She will keep her Wed. 11/26 appointment but would like to be worked in sooner on a Wednesday if a spot opens up.

## 2023-12-14 ENCOUNTER — Encounter: Payer: Self-pay | Admitting: Radiology

## 2023-12-16 ENCOUNTER — Ambulatory Visit
Admission: RE | Admit: 2023-12-16 | Discharge: 2023-12-16 | Disposition: A | Discharge status: Home / Self Care | Source: Ambulatory Visit | Attending: Family Medicine | Admitting: Family Medicine

## 2023-12-16 DIAGNOSIS — Z1231 Encounter for screening mammogram for malignant neoplasm of breast: Secondary | ICD-10-CM

## 2023-12-19 ENCOUNTER — Ambulatory Visit: Payer: Self-pay | Admitting: Family Medicine

## 2023-12-24 ENCOUNTER — Ambulatory Visit
Admission: RE | Admit: 2023-12-24 | Discharge: 2023-12-24 | Disposition: A | Discharge status: Home / Self Care | Source: Ambulatory Visit | Attending: Radiation Oncology | Admitting: Radiation Oncology

## 2023-12-24 ENCOUNTER — Encounter: Payer: Self-pay | Admitting: Radiation Oncology

## 2023-12-24 VITALS — BP 135/66 | HR 92 | Temp 98.1°F | Resp 16 | Wt 187.0 lb

## 2023-12-24 DIAGNOSIS — C50412 Malignant neoplasm of upper-outer quadrant of left female breast: Secondary | ICD-10-CM | POA: Diagnosis not present

## 2023-12-24 DIAGNOSIS — Z923 Personal history of irradiation: Secondary | ICD-10-CM | POA: Insufficient documentation

## 2023-12-24 DIAGNOSIS — Z79811 Long term (current) use of aromatase inhibitors: Secondary | ICD-10-CM | POA: Insufficient documentation

## 2023-12-24 DIAGNOSIS — Z170 Estrogen receptor positive status [ER+]: Secondary | ICD-10-CM | POA: Diagnosis not present

## 2023-12-24 NOTE — Progress Notes (Signed)
 Radiation Oncology Follow up Note  Name: Brandy Guerrero   Date:   12/24/2023 MRN:  [id number] DOB: July 22, 1950    This 73 y.o. 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  REFERRING PROVIDER: Rilla Baller, MD  HPI: Patient is a 73 year old female now out over 3 years having completed whole breast radiation to her left breast for stage Ia invasive mammary carcinoma ER positive.  Seen today in routine follow-up she is doing well.  She specifically denies breast tenderness cough or bone pain.  She does have some hesitation of people touching her breast not really rated to sensitivity may be some hesitation status post her surgery when the breast was extremely tender.  She has had a mammogram.  Recently which I have reviewed was BI-RADS 2 benign.  She is currently on Femara  tolerating it well without side effect.  COMPLICATIONS OF TREATMENT: none  FOLLOW UP COMPLIANCE: keeps appointments   PHYSICAL EXAM:  BP 135/66   Pulse 92   Temp 98.1 F (36.7 C) (Tympanic)   Resp 16   Wt 187 lb (84.8 kg)   BMI 33.93 kg/m  Lungs are clear to A&P cardiac examination essentially unremarkable with regular rate and rhythm. No dominant mass or nodularity is noted in either breast in 2 positions examined. Incision is well-healed. No axillary or supraclavicular adenopathy is appreciated. Cosmetic result is excellent.  Well-developed well-nourished patient in NAD. HEENT reveals PERLA, EOMI, discs not visualized.  Oral cavity is clear. No oral mucosal lesions are identified. Neck is clear without evidence of cervical or supraclavicular adenopathy. Lungs are clear to A&P. Cardiac examination is essentially unremarkable with regular rate and rhythm without murmur rub or thrill. Abdomen is benign with no organomegaly or masses noted. Motor sensory and DTR levels are equal and symmetric in the  upper and lower extremities. Cranial nerves II through XII are grossly intact. Proprioception is intact. No peripheral adenopathy or edema is identified. No motor or sensory levels are noted. Crude visual fields are within normal range.  RADIOLOGY RESULTS: Mammograms reviewed compatible with above-stated findings  PLAN: Present time patient is doing well now out over 3 years having completed whole breast radiation.  She has no evidence of disease.  I will see her 1 more time in a year and then turn follow-up care over to her GP.  GP has been ordering her mammograms.  Patient knows to call with any concerns at any time.  I would like to take this opportunity to thank you for allowing me to participate in the care of your patient.SABRA Marcey Penton, MD

## 2023-12-26 ENCOUNTER — Other Ambulatory Visit: Payer: Self-pay | Admitting: Family Medicine

## 2023-12-26 DIAGNOSIS — E538 Deficiency of other specified B group vitamins: Secondary | ICD-10-CM

## 2023-12-26 DIAGNOSIS — E1169 Type 2 diabetes mellitus with other specified complication: Secondary | ICD-10-CM

## 2023-12-26 DIAGNOSIS — R748 Abnormal levels of other serum enzymes: Secondary | ICD-10-CM

## 2023-12-28 ENCOUNTER — Other Ambulatory Visit: Payer: Self-pay | Admitting: Orthopaedic Surgery

## 2023-12-28 ENCOUNTER — Telehealth: Payer: Self-pay | Admitting: Orthopaedic Surgery

## 2023-12-28 MED ORDER — TRAMADOL HCL 50 MG PO TABS: 50.0000 mg | ORAL_TABLET | Freq: Four times a day (QID) | ORAL | 0 refills | Status: DC | PRN

## 2023-12-28 NOTE — Telephone Encounter (Signed)
 Pt called wanting to know if the Dr. Genelle can prescribe her something for pain for her left knee until she can see Dr Vernetta. Pharmacy is [facility] on [address] in [place]. Call back number is [phone]

## 2023-12-28 NOTE — Telephone Encounter (Signed)
 Please advise

## 2023-12-30 ENCOUNTER — Other Ambulatory Visit (INDEPENDENT_AMBULATORY_CARE_PROVIDER_SITE_OTHER)

## 2023-12-30 DIAGNOSIS — E1169 Type 2 diabetes mellitus with other specified complication: Secondary | ICD-10-CM | POA: Diagnosis not present

## 2023-12-30 DIAGNOSIS — E538 Deficiency of other specified B group vitamins: Secondary | ICD-10-CM | POA: Diagnosis not present

## 2023-12-30 DIAGNOSIS — R748 Abnormal levels of other serum enzymes: Secondary | ICD-10-CM | POA: Diagnosis not present

## 2023-12-30 LAB — VITAMIN B12: Vitamin B-12: 510 pg/mL (ref 211–911)

## 2023-12-30 LAB — HEPATIC FUNCTION PANEL
ALT: 10 U/L (ref 0–35)
AST: 12 U/L (ref 0–37)
Albumin: 4.1 g/dL (ref 3.5–5.2)
Alkaline Phosphatase: 112 U/L (ref 39–117)
Bilirubin, Direct: 0.1 mg/dL (ref 0.0–0.3)
Total Bilirubin: 0.6 mg/dL (ref 0.2–1.2)
Total Protein: 6.6 g/dL (ref 6.0–8.3)

## 2023-12-31 ENCOUNTER — Ambulatory Visit: Payer: Self-pay | Admitting: Family Medicine

## 2023-12-31 DIAGNOSIS — E1136 Type 2 diabetes mellitus with diabetic cataract: Secondary | ICD-10-CM | POA: Diagnosis not present

## 2023-12-31 DIAGNOSIS — E785 Hyperlipidemia, unspecified: Secondary | ICD-10-CM | POA: Diagnosis not present

## 2023-12-31 DIAGNOSIS — I10 Essential (primary) hypertension: Secondary | ICD-10-CM | POA: Diagnosis not present

## 2023-12-31 DIAGNOSIS — C50919 Malignant neoplasm of unspecified site of unspecified female breast: Secondary | ICD-10-CM | POA: Diagnosis not present

## 2023-12-31 DIAGNOSIS — Z7984 Long term (current) use of oral hypoglycemic drugs: Secondary | ICD-10-CM | POA: Diagnosis not present

## 2023-12-31 DIAGNOSIS — Z882 Allergy status to sulfonamides status: Secondary | ICD-10-CM | POA: Diagnosis not present

## 2023-12-31 DIAGNOSIS — Z6833 Body mass index (BMI) 33.0-33.9, adult: Secondary | ICD-10-CM | POA: Diagnosis not present

## 2023-12-31 DIAGNOSIS — E669 Obesity, unspecified: Secondary | ICD-10-CM | POA: Diagnosis not present

## 2023-12-31 LAB — HEMOGLOBIN A1C: Hgb A1c MFr Bld: 6.4 % (ref 4.6–6.5)

## 2024-01-06 ENCOUNTER — Encounter: Payer: Self-pay | Admitting: Orthopaedic Surgery

## 2024-01-06 ENCOUNTER — Encounter: Payer: Self-pay | Admitting: Family Medicine

## 2024-01-06 ENCOUNTER — Ambulatory Visit: Admitting: Family Medicine

## 2024-01-06 ENCOUNTER — Ambulatory Visit: Admitting: Orthopaedic Surgery

## 2024-01-06 VITALS — Ht 62.25 in | Wt 187.0 lb

## 2024-01-06 VITALS — BP 122/80 | HR 75 | Temp 97.9°F | Ht 62.25 in | Wt 187.0 lb

## 2024-01-06 DIAGNOSIS — M1711 Unilateral primary osteoarthritis, right knee: Secondary | ICD-10-CM | POA: Diagnosis not present

## 2024-01-06 DIAGNOSIS — M25561 Pain in right knee: Secondary | ICD-10-CM

## 2024-01-06 DIAGNOSIS — E1169 Type 2 diabetes mellitus with other specified complication: Secondary | ICD-10-CM

## 2024-01-06 DIAGNOSIS — E66811 Obesity, class 1: Secondary | ICD-10-CM

## 2024-01-06 DIAGNOSIS — E538 Deficiency of other specified B group vitamins: Secondary | ICD-10-CM

## 2024-01-06 DIAGNOSIS — K824 Cholesterolosis of gallbladder: Secondary | ICD-10-CM | POA: Diagnosis not present

## 2024-01-06 DIAGNOSIS — M170 Bilateral primary osteoarthritis of knee: Secondary | ICD-10-CM

## 2024-01-06 DIAGNOSIS — Z7984 Long term (current) use of oral hypoglycemic drugs: Secondary | ICD-10-CM | POA: Diagnosis not present

## 2024-01-06 DIAGNOSIS — R748 Abnormal levels of other serum enzymes: Secondary | ICD-10-CM

## 2024-01-06 DIAGNOSIS — M1712 Unilateral primary osteoarthritis, left knee: Secondary | ICD-10-CM | POA: Insufficient documentation

## 2024-01-06 DIAGNOSIS — M25562 Pain in left knee: Secondary | ICD-10-CM

## 2024-01-06 DIAGNOSIS — G8929 Other chronic pain: Secondary | ICD-10-CM

## 2024-01-06 NOTE — Patient Instructions (Addendum)
 Temporary handicap placard application provided today - take to [facility]  I will order liver ultrasound next month.  Good to see you today - continue current medicines.

## 2024-01-06 NOTE — Assessment & Plan Note (Addendum)
 Appreciate ortho care - discussing knee replacement surgery She recently had bilateral US  guided knee steroid injections by Dr Genelle. Requests handicap placard - temporary 6 mo application filled out for patient today.

## 2024-01-06 NOTE — Assessment & Plan Note (Addendum)
 7mm gallbladder polyp and adenomyomatosis noted on RUQ US  07/2023.  Will rpt US  6 months from last.  I will send myself a reminder in 3 wks to order this.

## 2024-01-06 NOTE — Assessment & Plan Note (Addendum)
 Congratulated on 7 lb weight loss  Obesity complicated by comorbidities of diabetes and osteoarthritis

## 2024-01-06 NOTE — Assessment & Plan Note (Signed)
This has normalized.  

## 2024-01-06 NOTE — Progress Notes (Addendum)
 Ph: [phone] Fax: [phone]   Patient ID: Brandy Guerrero, female    DOB: 03-13-1950, 73 y.o.   MRN: [id number]  This visit was conducted in person.  BP 122/80   Pulse 75   Temp 97.9 F (36.6 C) (Oral)   Ht 5' 2.25 (1.581 m)   Wt 187 lb (84.8 kg)   SpO2 96%   BMI 33.93 kg/m    CC: 6 mo f/u visit  Subjective:   HPI: Brandy Guerrero is a 73 y.o. female presenting on 01/06/2024 for Medical Management of Chronic Issues (Pt here for 6 mth f/u )   See prior note for details.  H/o stage T1 ER+ left breast cancer 3 yrs ago, seeing rad/onc yearly ([facility]). Recent mammogram normal.   DM - does not regularly check sugars - declines this. Compliant with antihyperglycemic regimen which includes: actos  30mg  daily, metformin  500mg  bid. Increased actos  04/2023 may have caused ankle swelling. Jardiance  stopped due to burning in groin. Denies low sugars or hypoglycemic symptoms. Denies paresthesias, blurry vision. Last diabetic eye exam 05/2023. Glucometer brand: doesn't have this. Last foot exam: 09/2023. DSME: declined.  DEXA 07/2021 WNL.  Lab Results  Component Value Date   HGBA1C 6.4 12/30/2023   Diabetic Foot Exam - Simple   No data filed    Lab Results  Component Value Date   MICROALBUR <0.7 06/24/2023    H/o elevated ALP with 7mm gallbladder polyp and adenomyomatosis on latest RUQ abd US  07/2023 - rec rpt US  6 months to monitor this. Will order. Fractionated alp last check with normal percentages.  ALP normalized on latest check.  She is asymptomatic from gallbladder disease symptoms.   Known L>R knee osteoarthritis - affecting ambulation. She has been seeing orthopedics. Requests handicap placard.      Relevant past medical, surgical, family and social history reviewed and updated as indicated. Interim medical history since our last visit reviewed. Allergies and medications reviewed and updated. Outpatient Medications Prior to Visit  Medication Sig Dispense Refill    atorvastatin  (LIPITOR) 40 MG tablet Take 1 tablet (40 mg total) by mouth daily. 90 tablet 4   Cholecalciferol (VITAMIN D ) 50 MCG (2000 UT) CAPS Take 1 capsule (2,000 Units total) by mouth daily.     clindamycin  (CLEOCIN  T) 1 % external solution Apply 1-2 times daily to scalp as needed for breakouts 60 mL 5   Cyanocobalamin  (VITAMIN B-12) 1000 MCG SUBL Place 1 tablet (1,000 mcg total) under the tongue once a week.     Fluocinolone  Acetonide Body 0.01 % OIL APPLY TO SCALP 1-2 TIMES DAILY 119 mL 5   fluocinonide  (LIDEX ) 0.05 % external solution Apply 1-2 drops once or twice daily to affected areas on scalp as needed for itching 60 mL 2   ketoconazole  (NIZORAL ) 2 % shampoo 1-2 times per week lather on scalp, leave on 8-10 minutes, rinse well 120 mL 5   letrozole  (FEMARA ) 2.5 MG tablet Take 1 tablet by mouth once daily 90 tablet 0   metFORMIN  (GLUCOPHAGE ) 500 MG tablet Take 1 tablet (500 mg total) by mouth 2 (two) times daily with a meal. 180 tablet 4   metoprolol  tartrate (LOPRESSOR ) 25 MG tablet Take 1 tablet (25 mg total) by mouth 2 (two) times daily as needed (heart fluttering). 30 tablet 3   minoxidil  (LONITEN ) 2.5 MG tablet Take 1 tablet (2.5 mg total) by mouth daily. 30 tablet 3   pioglitazone  (ACTOS ) 30 MG tablet Take 1 tablet (30 mg  total) by mouth daily. 90 tablet 4   traMADol  (ULTRAM ) 50 MG tablet Take 1 tablet (50 mg total) by mouth every 6 (six) hours as needed. 20 tablet 0   vitamin E 180 MG (400 UNITS) capsule Take 400 Units by mouth daily. Take two by mouth daily     azithromycin  (ZITHROMAX ) 250 MG tablet Take by mouth.     No facility-administered medications prior to visit.     Per HPI unless specifically indicated in ROS section below Review of Systems  Objective:  BP 122/80   Pulse 75   Temp 97.9 F (36.6 C) (Oral)   Ht 5' 2.25 (1.581 m)   Wt 187 lb (84.8 kg)   SpO2 96%   BMI 33.93 kg/m   Wt Readings from Last 3 Encounters:  01/06/24 187 lb (84.8 kg)  01/06/24 187 lb  (84.8 kg)  12/24/23 187 lb (84.8 kg)      Physical Exam Vitals and nursing note reviewed.  Constitutional:      Appearance: Normal appearance. She is not ill-appearing.  HENT:     Head: Normocephalic and atraumatic.     Mouth/Throat:     Mouth: Mucous membranes are moist.     Pharynx: Oropharynx is clear. No oropharyngeal exudate or posterior oropharyngeal erythema.  Eyes:     Extraocular Movements: Extraocular movements intact.     Conjunctiva/sclera: Conjunctivae normal.     Pupils: Pupils are equal, round, and reactive to light.  Cardiovascular:     Rate and Rhythm: Normal rate and regular rhythm.     Pulses: Normal pulses.     Heart sounds: Normal heart sounds. No murmur heard. Pulmonary:     Effort: Pulmonary effort is normal. No respiratory distress.     Breath sounds: Normal breath sounds. No wheezing, rhonchi or rales.  Abdominal:     General: Bowel sounds are normal. There is no distension.     Palpations: Abdomen is soft. There is no mass.     Tenderness: There is no abdominal tenderness. There is no guarding or rebound.     Hernia: No hernia is present.  Musculoskeletal:     Right lower leg: No edema.     Left lower leg: No edema.  Skin:    General: Skin is warm and dry.     Findings: No rash.  Neurological:     Mental Status: She is alert.  Psychiatric:        Mood and Affect: Mood normal.        Behavior: Behavior normal.       Results for orders placed or performed in visit on 12/30/23  Vitamin B12   Collection Time: 12/30/23  9:40 AM  Result Value Ref Range   Vitamin B-12 510 211 - 911 pg/mL  Hemoglobin A1c   Collection Time: 12/30/23  9:40 AM  Result Value Ref Range   Hgb A1c MFr Bld 6.4 4.6 - 6.5 %  Hepatic function panel   Collection Time: 12/30/23  9:40 AM  Result Value Ref Range   Total Bilirubin 0.6 0.2 - 1.2 mg/dL   Bilirubin, Direct 0.1 0.0 - 0.3 mg/dL   Alkaline Phosphatase 112 39 - 117 U/L   AST 12 0 - 37 U/L   ALT 10 0 - 35 U/L    Total Protein 6.6 6.0 - 8.3 g/dL   Albumin 4.1 3.5 - 5.2 g/dL    Assessment & Plan:   Problem List Items Addressed This Visit  Obesity, Class I, BMI 30-34.9   Congratulated on 7 lb weight loss  Obesity complicated by comorbidities of diabetes and osteoarthritis      Type 2 diabetes mellitus with other specified complication (HCC) - Primary   Chronic on metformin  and actos  with stable glycemic control.  Ankle edema has overall improved - will continue current regimen which is well tolerated.  Obesity and osteoarthritis is associated with diabetes.       Vitamin B12 deficiency   Levels have normalized - rec start once weekly b12 .       Elevated alkaline phosphatase level   This has normalized.      Gallbladder polyp   7mm gallbladder polyp and adenomyomatosis noted on RUQ US  07/2023.  Will rpt US  6 months from last.  I will send myself a reminder in 3 wks to order this.       Bilateral primary osteoarthritis of knee   Appreciate ortho care - discussing knee replacement surgery She recently had bilateral US  guided knee steroid injections by Dr Genelle. Requests handicap placard - temporary 6 mo application filled out for patient today.         No orders of the defined types were placed in this encounter.   No orders of the defined types were placed in this encounter.   Patient Instructions  Temporary handicap placard application provided today - take to [facility]  I will order liver ultrasound next month.  Good to see you today - continue current medicines.   Follow up plan: Return in about 6 months (around 07/05/2024) for annual exam, prior fasting for blood work, medicare wellness visit.  Anton Blas, MD

## 2024-01-06 NOTE — Progress Notes (Signed)
 The patient is a very pleasant and active 73 year old female with debilitating end-stage arthritis of her left knee.  She does have arthritis of her right knee but that does not really bother her.  The left knee has been hurting for well over a year now.  She had a steroid injection in early September that left knee by my partner Dr.Bokshan and this did temporize her pain for just a little bit.  She does walk with a significant limp and she does have 10 out of 10 pain on a daily basis with her left knee.  It is detriment affecting her mobility, quality of life and her actives daily living and she was sent to me discussed knee replacement surgery.  She is a diabetic that is type II but has a hemoglobin A1c that is in the 6 range.  She does not have any other significant comorbidities.  Her BMI is 33.93.  I did review all of her past medical history and medications within epic.  On examination of her left knee it is very painful.  There is a mild effusion.  There is significant varus malalignment that is correctable.  There is severe pain in the medial and lateral joint lines and patellofemoral joint.  Her range of motion is limited secondary to pain with a slight flexion contracture and significant patellofemoral crepitation.  X-rays reviewed on the canopy system of her left knee show bone-on-bone arthritis with varus malalignment.  There is complete loss of the joint space of the medial compartment and patellofemoral joint with osteophytes in all 3 compartments.  We had a long and thorough discussion about knee replacement surgery.  I showed her knee replacement model and discussed the risk and benefits of the surgery and what to expect from an intraoperative and postoperative standpoint.  She does not have good support at home and her husband is almost blind.  Her mother is 90 and stays in a skilled nursing facility and the patient is insistent that she have some recovery postop for short-term and skilled  nursing as well.  It is hard not to agree with this based on what she is describing from a quality-of-life as well as help standpoint at home.  We will see if we can do from our standpoint.  Regardless, we will work on trying to set her up for a knee replacement surgery in the near future for her left knee.  She would like to have this done soon if possible.

## 2024-01-06 NOTE — Assessment & Plan Note (Signed)
 Levels have normalized - rec start once weekly b12 1000mcg.

## 2024-01-06 NOTE — Assessment & Plan Note (Addendum)
 Chronic on metformin  and actos  with stable glycemic control.  Ankle edema has overall improved - will continue current regimen which is well tolerated.  Obesity and osteoarthritis is associated with diabetes.

## 2024-01-19 ENCOUNTER — Other Ambulatory Visit: Payer: Self-pay | Admitting: Physician Assistant

## 2024-01-19 DIAGNOSIS — Z01818 Encounter for other preprocedural examination: Secondary | ICD-10-CM

## 2024-01-19 NOTE — Progress Notes (Signed)
 Date of COVID positive in last 90 days:  PCP - Anton Blas, MD Cardiologist -   Chest x-ray - N/A EKG - N/A Stress Test - N/A ECHO - N/A Cardiac Cath - N/A Pacemaker/ICD device last checked:N/A Spinal Cord Stimulator:N/A  Bowel Prep - N/A  Sleep Study - N/A CPAP -   Fasting Blood Sugar - N/A Checks Blood Sugar _____ times a day  Last dose of GLP1 agonist-  N/A GLP1 instructions:  Do not take after     Last dose of SGLT-2 inhibitors-  N/A SGLT-2 instructions:  Do not take after     Blood Thinner Instructions: N/A Last dose:   Time: Aspirin Instructions:N/A Last Dose:  Activity level:  Can go up a flight of stairs and perform activities of daily living without stopping and without symptoms of chest pain or shortness of breath.  Able to exercise without symptoms  Unable to go up a flight of stairs without symptoms of     Anesthesia review: N/A  Patient denies shortness of breath, fever, cough and chest pain at PAT appointment  Patient verbalized understanding of instructions that were given to them at the PAT appointment. Patient was also instructed that they will need to review over the PAT instructions again at home before surgery.

## 2024-01-19 NOTE — Patient Instructions (Signed)
 SURGICAL WAITING ROOM VISITATION Patients having surgery or a procedure may have no more than 2 support people in the waiting area - these visitors may rotate.    Children under the age of 57 will not be allowed to visit due to the increase in respiratory illness  Children under the age of 82 must have an adult with them who is not the patient.  If the patient needs to stay at the hospital during part of their recovery, the visitor guidelines for inpatient rooms apply. Pre-op nurse will coordinate an appropriate time for 1 support person to accompany patient in pre-op.  This support person may not rotate.    Please refer to the [facility] website for the visitor guidelines for Inpatients (after your surgery is over and you are in a regular room).       Your procedure is scheduled on: 01-29-24   Report to [facility] Main Entrance    Report to admitting at 10:00 AM   Call this number if you have problems the morning of surgery [phone]   Do not eat food :After Midnight.   After Midnight you may have the following liquids until 9:30 AM DAY OF SURGERY  Water Non-Citrus Juices (without pulp, NO RED-Apple, White grape, White cranberry) Black Coffee (NO MILK/CREAM OR CREAMERS, sugar ok)  Clear Tea (NO MILK/CREAM OR CREAMERS, sugar ok) regular and decaf                             Plain Jell-O (NO RED)                                           Fruit ices (not with fruit pulp, NO RED)                                     Popsicles (NO RED)                                                               Sports drinks like Gatorade (NO RED)                   The day of surgery:  Drink ONE (1) Pre-Surgery G2 by 9:30 AM the morning of surgery. Drink in one sitting. Do not sip.  This drink was given to you during your hospital  pre-op appointment visit. Nothing else to drink after completing the Pre-Surgery G2.          If you have questions, please contact your surgeon's  office.   FOLLOW  ANY ADDITIONAL PRE OP INSTRUCTIONS YOU RECEIVED FROM YOUR SURGEON'S OFFICE!!!     Oral Hygiene is also important to reduce your risk of infection.                                    Remember - BRUSH YOUR TEETH THE MORNING OF SURGERY WITH YOUR REGULAR TOOTHPASTE   Do NOT smoke after Midnight   Take  these medicines the morning of surgery with A SIP OF WATER:    Atorvastatin    Metoprolol    Tramadol  if needed  Stop all vitamins and herbal supplements 7 days before surgery  How to Manage Your Diabetes Before and After Surgery  Why is it important to control my blood sugar before and after surgery? Improving blood sugar levels before and after surgery helps healing and can limit problems. A way of improving blood sugar control is eating a healthy diet by:  Eating less sugar and carbohydrates  Increasing activity/exercise  Talking with your doctor about reaching your blood sugar goals High blood sugars (greater than 180 mg/dL) can raise your risk of infections and slow your recovery, so you will need to focus on controlling your diabetes during the weeks before surgery. Make sure that the doctor who takes care of your diabetes knows about your planned surgery including the date and location.  How do I manage my blood sugar before surgery? Check your blood sugar at least 4 times a day, starting 2 days before surgery, to make sure that the level is not too high or low. Check your blood sugar the morning of your surgery when you wake up and every 2 hours until you get to the Short Stay unit. If your blood sugar is less than 70 mg/dL, you will need to treat for low blood sugar: Do not take insulin . Treat a low blood sugar (less than 70 mg/dL) with  cup of clear juice (cranberry or apple), 4 glucose tablets, OR glucose gel. Recheck blood sugar in 15 minutes after treatment (to make sure it is greater than 70 mg/dL). If your blood sugar is not greater than 70 mg/dL on  recheck, call [phone] for further instructions. Report your blood sugar to the short stay nurse when you get to Short Stay.  If you are admitted to the hospital after surgery: Your blood sugar will be checked by the staff and you will probably be given insulin  after surgery (instead of oral diabetes medicines) to make sure you have good blood sugar levels. The goal for blood sugar control after surgery is 80-180 mg/dL.   WHAT DO I DO ABOUT MY DIABETES MEDICATION?  Do not take oral diabetes medicines (pills) the morning of surgery (do not take Metformin  or Actos )  DO NOT TAKE THE FOLLOWING 7 DAYS PRIOR TO SURGERY: Ozempic, Wegovy, Rybelsus (Semaglutide), Byetta (exenatide), Bydureon (exenatide ER), Victoza, Saxenda (liraglutide), or Trulicity (dulaglutide) Mounjaro (Tirzepatide) Adlyxin (Lixisenatide), Polyethylene Glycol Loxenatide.  Reviewed and Endorsed by [facility] Patient Education Committee, August 2015  Bring CPAP mask and tubing day of surgery.                              You may not have any metal on your body including hair pins, jewelry, and body piercing             Do not wear make-up, lotions, powders, perfumes, or deodorant  Do not wear nail polish including gel and S&S, artificial/acrylic nails, or any other type of covering on natural nails including finger and toenails. If you have artificial nails, gel coating, etc. that needs to be removed by a nail salon please have this removed prior to surgery or surgery may need to be canceled/ delayed if the surgeon/ anesthesia feels like they are unable to be safely monitored.   Do not shave  48 hours prior to surgery.  Do not bring valuables to the hospital. [facility] IS NOT RESPONSIBLE   FOR VALUABLES.   Contacts, dentures or bridgework may not be worn into surgery.   Bring small overnight bag day of surgery.   DO NOT BRING YOUR HOME MEDICATIONS TO THE HOSPITAL. PHARMACY WILL DISPENSE MEDICATIONS LISTED  ON YOUR MEDICATION LIST TO YOU DURING YOUR ADMISSION IN THE HOSPITAL!   Special Instructions: Bring a copy of your healthcare power of attorney and living will documents the day of surgery if you haven't scanned them before.              Please read over the following fact sheets you were given: IF YOU HAVE QUESTIONS ABOUT YOUR PRE-OP INSTRUCTIONS PLEASE CALL [phone] Gwen  If you received a COVID test during your pre-op visit  it is requested that you wear a mask when out in public, stay away from anyone that may not be feeling well and notify your surgeon if you develop symptoms. If you test positive for Covid or have been in contact with anyone that has tested positive in the last 10 days please notify you surgeon.   Pre-operative 4 CHG Bath Instructions  DYNA-Hex 4 Chlorhexidine  Gluconate 4% Solution Antiseptic 4 fl. oz   You can play a key role in reducing the risk of infection after surgery. Your skin needs to be as free of germs as possible. You can reduce the number of germs on your skin by washing with CHG (chlorhexidine  gluconate) soap before surgery. CHG is an antiseptic soap that kills germs and continues to kill germs even after washing.   DO NOT use if you have an allergy to chlorhexidine /CHG or antibacterial soaps. If your skin becomes reddened or irritated, stop using the CHG and notify one of our RNs at   Please shower with the CHG soap starting 4 days before surgery using the following schedule:     Please keep in mind the following:  DO NOT shave, including legs and underarms, starting the day of your first shower.   You may shave your face at any point before/day of surgery.  Place clean sheets on your bed the day you start using CHG soap. Use a clean washcloth (not used since being washed) for each shower. DO NOT sleep with pets once you start using the CHG.  CHG Shower Instructions:  If you choose to wash your hair and private area, wash first with your normal  shampoo/soap.  After you use shampoo/soap, rinse your hair and body thoroughly to remove shampoo/soap residue.  Turn the water OFF and apply about 3 tablespoons (45 ml) of CHG soap to a CLEAN washcloth.  Apply CHG soap ONLY FROM YOUR NECK DOWN TO YOUR TOES (washing for 3-5 minutes)  DO NOT use CHG soap on face, private areas, open wounds, or sores.  Pay special attention to the area where your surgery is being performed.  If you are having back surgery, having someone wash your back for you may be helpful. Wait 2 minutes after CHG soap is applied, then you may rinse off the CHG soap.  Pat dry with a clean towel  Put on clean clothes/pajamas   If you choose to wear lotion, please use ONLY the CHG-compatible lotions on the back of this paper.     Additional instructions for the day of surgery:  Shower with regular soap the day of surgery DO NOT APPLY any lotions, deodorants, cologne, or perfumes.   Put on clean/comfortable clothes.  Brush your teeth.  Ask your nurse before applying any prescription medications to the skin.   CHG Compatible Lotions   Aveeno Moisturizing lotion  Cetaphil Moisturizing Cream  Cetaphil Moisturizing Lotion  Clairol Herbal Essence Moisturizing Lotion, Dry Skin  Clairol Herbal Essence Moisturizing Lotion, Extra Dry Skin  Clairol Herbal Essence Moisturizing Lotion, Normal Skin  Curel Age Defying Therapeutic Moisturizing Lotion with Alpha Hydroxy  Curel Extreme Care Body Lotion  Curel Soothing Hands Moisturizing Hand Lotion  Curel Therapeutic Moisturizing Cream, Fragrance-Free  Curel Therapeutic Moisturizing Lotion, Fragrance-Free  Curel Therapeutic Moisturizing Lotion, Original Formula  Eucerin Daily Replenishing Lotion  Eucerin Dry Skin Therapy Plus Alpha Hydroxy Crme  Eucerin Dry Skin Therapy Plus Alpha Hydroxy Lotion  Eucerin Original Crme  Eucerin Original Lotion  Eucerin Plus Crme Eucerin Plus Lotion  Eucerin TriLipid Replenishing Lotion  Keri  Anti-Bacterial Hand Lotion  Keri Deep Conditioning Original Lotion Dry Skin Formula Softly Scented  Keri Deep Conditioning Original Lotion, Fragrance Free Sensitive Skin Formula  Keri Lotion Fast Absorbing Fragrance Free Sensitive Skin Formula  Keri Lotion Fast Absorbing Softly Scented Dry Skin Formula  Keri Original Lotion  Keri Skin Renewal Lotion Keri Silky Smooth Lotion  Keri Silky Smooth Sensitive Skin Lotion  Nivea Body Creamy Conditioning Oil  Nivea Body Extra Enriched Lotion  Nivea Body Original Lotion  Nivea Body Sheer Moisturizing Lotion Nivea Crme  Nivea Skin Firming Lotion  NutraDerm 30 Skin Lotion  NutraDerm Skin Lotion  NutraDerm Therapeutic Skin Cream  NutraDerm Therapeutic Skin Lotion  ProShield Protective Hand Cream  Provon moisturizing lotion   PATIENT SIGNATURE_________________________________  NURSE SIGNATURE__________________________________  ________________________________________________________________________    Nasario Exon  An incentive spirometer is a tool that can help keep your lungs clear and active. This tool measures how well you are filling your lungs with each breath. Taking long deep breaths may help reverse or decrease the chance of developing breathing (pulmonary) problems (especially infection) following: A long period of time when you are unable to move or be active. BEFORE THE PROCEDURE  If the spirometer includes an indicator to show your best effort, your nurse or respiratory therapist will set it to a desired goal. If possible, sit up straight or lean slightly forward. Try not to slouch. Hold the incentive spirometer in an upright position. INSTRUCTIONS FOR USE  Sit on the edge of your bed if possible, or sit up as far as you can in bed or on a chair. Hold the incentive spirometer in an upright position. Breathe out normally. Place the mouthpiece in your mouth and seal your lips tightly around it. Breathe in slowly and as  deeply as possible, raising the piston or the ball toward the top of the column. Hold your breath for 3-5 seconds or for as long as possible. Allow the piston or ball to fall to the bottom of the column. Remove the mouthpiece from your mouth and breathe out normally. Rest for a few seconds and repeat Steps 1 through 7 at least 10 times every 1-2 hours when you are awake. Take your time and take a few normal breaths between deep breaths. The spirometer may include an indicator to show your best effort. Use the indicator as a goal to work toward during each repetition. After each set of 10 deep breaths, practice coughing to be sure your lungs are clear. If you have an incision (the cut made at the time of surgery), support your incision when coughing by placing a pillow or rolled up towels firmly  against it. Once you are able to get out of bed, walk around indoors and cough well. You may stop using the incentive spirometer when instructed by your caregiver.  RISKS AND COMPLICATIONS Take your time so you do not get dizzy or light-headed. If you are in pain, you may need to take or ask for pain medication before doing incentive spirometry. It is harder to take a deep breath if you are having pain. AFTER USE Rest and breathe slowly and easily. It can be helpful to keep track of a log of your progress. Your caregiver can provide you with a simple table to help with this. If you are using the spirometer at home, follow these instructions: SEEK MEDICAL CARE IF:  You are having difficultly using the spirometer. You have trouble using the spirometer as often as instructed. Your pain medication is not giving enough relief while using the spirometer. You develop fever of 100.5 F (38.1 C) or higher. SEEK IMMEDIATE MEDICAL CARE IF:  You cough up bloody sputum that had not been present before. You develop fever of 102 F (38.9 C) or greater. You develop worsening pain at or near the incision site. MAKE  SURE YOU:  Understand these instructions. Will watch your condition. Will get help right away if you are not doing well or get worse. Document Released: 06/09/2006 Document Revised: 04/21/2011 Document Reviewed: 08/10/2006 [facility] Patient Information 2014 [place], [place].

## 2024-01-20 ENCOUNTER — Other Ambulatory Visit: Payer: Self-pay | Admitting: Hematology and Oncology

## 2024-01-20 ENCOUNTER — Other Ambulatory Visit: Payer: Self-pay | Admitting: Dermatology

## 2024-01-20 DIAGNOSIS — L649 Androgenic alopecia, unspecified: Secondary | ICD-10-CM

## 2024-01-22 ENCOUNTER — Encounter (HOSPITAL_COMMUNITY): Payer: Self-pay

## 2024-01-22 ENCOUNTER — Encounter (HOSPITAL_COMMUNITY)
Admission: RE | Admit: 2024-01-22 | Discharge: 2024-01-22 | Disposition: A | Source: Ambulatory Visit | Attending: Orthopaedic Surgery

## 2024-01-22 ENCOUNTER — Other Ambulatory Visit: Payer: Self-pay

## 2024-01-22 VITALS — BP 140/73 | HR 87 | Temp 98.5°F | Resp 16 | Ht 62.0 in | Wt 180.2 lb

## 2024-01-22 DIAGNOSIS — D649 Anemia, unspecified: Secondary | ICD-10-CM

## 2024-01-22 DIAGNOSIS — E119 Type 2 diabetes mellitus without complications: Secondary | ICD-10-CM

## 2024-01-22 DIAGNOSIS — Z01818 Encounter for other preprocedural examination: Secondary | ICD-10-CM

## 2024-01-22 HISTORY — DX: Gastro-esophageal reflux disease without esophagitis: K21.9

## 2024-01-22 HISTORY — DX: Unspecified osteoarthritis, unspecified site: M19.90

## 2024-01-22 HISTORY — DX: Type 2 diabetes mellitus without complications: E11.9

## 2024-01-22 LAB — CBC
HCT: 40.9 % (ref 36.0–46.0)
Hemoglobin: 12.7 g/dL (ref 12.0–15.0)
MCH: 30.2 pg (ref 26.0–34.0)
MCHC: 31.1 g/dL (ref 30.0–36.0)
MCV: 97.1 fL (ref 80.0–100.0)
Platelets: 312 K/uL (ref 150–400)
RBC: 4.21 MIL/uL (ref 3.87–5.11)
RDW: 12.3 % (ref 11.5–15.5)
WBC: 7.3 K/uL (ref 4.0–10.5)
nRBC: 0 % (ref 0.0–0.2)

## 2024-01-22 LAB — BASIC METABOLIC PANEL WITH GFR
Anion gap: 10 (ref 5–15)
BUN: 10 mg/dL (ref 8–23)
CO2: 28 mmol/L (ref 22–32)
Calcium: 10.3 mg/dL (ref 8.9–10.3)
Chloride: 105 mmol/L (ref 98–111)
Creatinine, Ser: 0.67 mg/dL (ref 0.44–1.00)
GFR, Estimated: >60 mL/min (ref >60)
Glucose, Bld: 133 mg/dL — ABNORMAL HIGH (ref 70–99)
Potassium: 4.1 mmol/L (ref 3.5–5.1)
Sodium: 143 mmol/L (ref 135–145)

## 2024-01-22 LAB — SURGICAL PCR SCREEN
MRSA, PCR: NEGATIVE
Staphylococcus aureus: NEGATIVE

## 2024-01-22 LAB — GLUCOSE, CAPILLARY: Glucose-Capillary: 133 mg/dL — ABNORMAL HIGH (ref 70–99)

## 2024-01-28 NOTE — H&P (Signed)
 TOTAL KNEE ADMISSION H&P  Patient is being admitted for left total knee arthroplasty.  Subjective:  Chief Complaint:left knee pain.  HPI: Brandy Guerrero, 73 y.o. female, has a history of pain and functional disability in the left knee due to arthritis and has failed non-surgical conservative treatments for greater than 12 weeks to includeNSAID's and/or analgesics, corticosteriod injections, viscosupplementation injections, flexibility and strengthening excercises, use of assistive devices, weight reduction as appropriate, and activity modification.  Onset of symptoms was gradual, starting several years ago with gradually worsening course since that time. The patient noted no past surgery on the left knee(s).  Patient currently rates pain in the left knee(s) at 10 out of 10 with activity. Patient has night pain, worsening of pain with activity and weight bearing, pain that interferes with activities of daily living, pain with passive range of motion, crepitus, and joint swelling.  Patient has evidence of subchondral sclerosis, periarticular osteophytes, and joint space narrowing by imaging studies. There is no active infection.  Patient Active Problem List   Diagnosis Date Noted   Bilateral primary osteoarthritis of knee 01/06/2024   Unilateral primary osteoarthritis, right knee 01/06/2024   Unilateral primary osteoarthritis, left knee 01/06/2024   Pedal edema 09/30/2023   Gallbladder polyp 08/04/2023   Nocturnal leg cramps 07/02/2023   Elevated alkaline phosphatase level 07/01/2023   Vitamin B12 deficiency 12/20/2022   Vitamin D  deficiency 12/20/2022   Malignant neoplasm of upper-outer quadrant of left breast in female, estrogen receptor positive (HCC) 08/31/2020   Medicare annual wellness visit, subsequent 04/24/2020   Advanced care planning/counseling discussion 10/28/2017   Health maintenance examination 10/21/2011   Type 2 diabetes mellitus with other specified complication (HCC)  10/21/2011   Palpitations 09/17/2011   Umbilical hernia 09/17/2011   Obesity, Class I, BMI 30-34.9 09/17/2011   Panic attacks    Hyperlipidemia associated with type 2 diabetes mellitus (HCC)    Acne    Pelvic mass in female    Past Medical History:  Diagnosis Date   Acne    dermatology - on doxy 100mg  daily   Anemia    Arthritis    Breast cancer (HCC)    Bursitis of right shoulder    COVID-19 02/12/2022   Diabetes mellitus without complication (HCC)    GERD (gastroesophageal reflux disease)    History of atrial fibrillation 05/11/2005   h/o parox Afib, nl cardiolite/echo, no prolonged episodes.  metoprolol  prn [place])   HLD (hyperlipidemia)    Obesity    Panic attacks    Positive occult stool blood test 02/11/2012   s/p normal colonoscopy, rpt stool cards negative    Past Surgical History:  Procedure Laterality Date   ABDOMINAL HYSTERECTOMY  1990s   for fibroids, has one ovary remaining ([place]). partial   BREAST BIOPSY     BREAST LUMPECTOMY Left 09/26/2020   BREAST LUMPECTOMY WITH RADIOACTIVE SEED AND SENTINEL LYMPH NODE BIOPSY Left 09/26/2020   Procedure: LEFT BREAST LUMPECTOMY WITH RADIOACTIVE SEED AND SENTINEL LYMPH NODE BIOPSY;  Surgeon: Curvin Mt III, MD;  Location: [facility] OR;  Service: General;  Laterality: Left;   COLONOSCOPY  01/2012   mod diverticulosis, rec rpt stool cards and if + rec EGD (returned negative), rpt colonoscopy 10 yrs   COLONOSCOPY  03/2022   5 TAs, diverticulosis, rpt 3 yrs ([place])   GLAUCOMA SURGERY Left 04/2023   Trabeculoplasty ([place])    No current facility-administered medications for this encounter.   Current Outpatient Medications  Medication Sig Dispense Refill Last Dose/Taking  atorvastatin  (LIPITOR) 40 MG tablet Take 1 tablet (40 mg total) by mouth daily. 90 tablet 4 Taking   Cholecalciferol (VITAMIN D ) 50 MCG (2000 UT) CAPS Take 1 capsule (2,000 Units total) by mouth daily.   Taking   letrozole  (FEMARA ) 2.5 MG tablet Take  1 tablet by mouth once daily 90 tablet 0 Taking   metFORMIN  (GLUCOPHAGE ) 500 MG tablet Take 1 tablet (500 mg total) by mouth 2 (two) times daily with a meal. 180 tablet 4 Taking   minoxidil  (LONITEN ) 2.5 MG tablet Take 1 tablet (2.5 mg total) by mouth daily. 30 tablet 3 Taking   OVER THE COUNTER MEDICATION Take 4 capsules by mouth daily. Nutrafol   Taking   pioglitazone  (ACTOS ) 30 MG tablet Take 1 tablet (30 mg total) by mouth daily. 90 tablet 4 Taking   vitamin E 180 MG (400 UNITS) capsule Take 400 Units by mouth daily.   Taking   clindamycin  (CLEOCIN  T) 1 % external solution Apply 1-2 times daily to scalp as needed for breakouts (Patient not taking: Reported on 01/21/2024) 60 mL 5 Not Taking   Cyanocobalamin  (VITAMIN B-12) 1000 MCG SUBL Place 1 tablet (1,000 mcg total) under the tongue once a week. (Patient not taking: Reported on 01/21/2024)   Not Taking   Fluocinolone  Acetonide Body 0.01 % OIL APPLY TO SCALP 1-2 TIMES DAILY (Patient not taking: Reported on 01/21/2024) 119 mL 5 Not Taking   fluocinonide  (LIDEX ) 0.05 % external solution Apply 1-2 drops once or twice daily to affected areas on scalp as needed for itching (Patient not taking: Reported on 01/21/2024) 60 mL 2 Not Taking   ketoconazole  (NIZORAL ) 2 % shampoo 1-2 times per week lather on scalp, leave on 8-10 minutes, rinse well (Patient not taking: Reported on 01/21/2024) 120 mL 5 Not Taking   metoprolol  tartrate (LOPRESSOR ) 25 MG tablet Take 1 tablet (25 mg total) by mouth 2 (two) times daily as needed (heart fluttering). 30 tablet 3    traMADol  (ULTRAM ) 50 MG tablet Take 1 tablet (50 mg total) by mouth every 6 (six) hours as needed. (Patient not taking: Reported on 01/21/2024) 20 tablet 0 Not Taking   Allergies[1]  Social History   Tobacco Use   Smoking status: Never   Smokeless tobacco: Never  Substance Use Topics   Alcohol use: Yes    Alcohol/week: 0.0 standard drinks of alcohol    Comment: Rare    Family History  Problem  Relation Age of Onset   Hypertension Mother    Alcohol abuse Father    Diabetes Daughter    Alcohol abuse Brother    Cancer Neg Hx    Coronary artery disease Neg Hx    Stroke Neg Hx    Colon cancer Neg Hx    Colon polyps Neg Hx    Esophageal cancer Neg Hx    Rectal cancer Neg Hx    Stomach cancer Neg Hx    Breast cancer Neg Hx      Review of Systems  Objective:  Physical Exam Vitals reviewed.  Constitutional:      Appearance: Normal appearance.  HENT:     Head: Normocephalic and atraumatic.  Eyes:     Extraocular Movements: Extraocular movements intact.     Pupils: Pupils are equal, round, and reactive to light.  Cardiovascular:     Rate and Rhythm: Normal rate and regular rhythm.  Pulmonary:     Effort: Pulmonary effort is normal.     Breath sounds: Normal breath  sounds.  Abdominal:     Palpations: Abdomen is soft.  Musculoskeletal:     Left knee: Effusion, bony tenderness and crepitus present. Decreased range of motion. Tenderness present over the medial joint line, lateral joint line and patellar tendon. Abnormal alignment.  Neurological:     Mental Status: She is alert and oriented to person, place, and time.  Psychiatric:        Behavior: Behavior normal.     Vital signs in last 24 hours:    Labs:   Estimated body mass index is 32.96 kg/m as calculated from the following:   Height as of 01/22/24: 5' 2 (1.575 m).   Weight as of 01/22/24: 81.7 kg.   Imaging Review Plain radiographs demonstrate severe degenerative joint disease of the left knee(s). The overall alignment isneutral. The bone quality appears to be good for age and reported activity level.      Assessment/Plan:  End stage arthritis, left knee   The patient history, physical examination, clinical judgment of the provider and imaging studies are consistent with end stage degenerative joint disease of the left knee(s) and total knee arthroplasty is deemed medically necessary. The  treatment options including medical management, injection therapy arthroscopy and arthroplasty were discussed at length. The risks and benefits of total knee arthroplasty were presented and reviewed. The risks due to aseptic loosening, infection, stiffness, patella tracking problems, thromboembolic complications and other imponderables were discussed. The patient acknowledged the explanation, agreed to proceed with the plan and consent was signed. Patient is being admitted for inpatient treatment for surgery, pain control, PT, OT, prophylactic antibiotics, VTE prophylaxis, progressive ambulation and ADL's and discharge planning. The patient is planning to be discharged home with home health services        [1]  Allergies Allergen Reactions   Sulfa Antibiotics Rash

## 2024-01-28 NOTE — Care Plan (Addendum)
 Ortho Bundle Case Management Note  Patient Details  Name: Brandy Guerrero MRN: [id number] Date of Birth: [birth year]  [facility] RNCM call to patient and after multiple attempts, was finally able to reach patient to discuss her upcoming Left total knee arthroplasty with Dr. Vernetta on 01/29/24 at [facility]. She is agreeable to case management. She lives with her spouse, but states he is partially blind and is adamant she wants to go to a SNF for rehab. She would like [facility] for her STR and has reached out already to Admissions there. I explained process once she comes into hospital. She does not have a RW. CM did make referral to [facility] for HHPT in case SNF admission does not happen. Reviewed post op care instructions and will continue to follow for needs.                 DME Arranged:  [facility] DME Agency:     HH Arranged:  PT HH Agency:  Referral to [facility] (Patient wants to go to SNF for STR immediately after discharge/ [facility].  Additional Comments: Please contact me with any questions of if this plan should need to change.  Tylene Ned, RN, BSN, [employer]  [phone] 01/28/2024, 10:38 AM

## 2024-01-29 ENCOUNTER — Encounter (HOSPITAL_COMMUNITY): Payer: Self-pay | Admitting: Medical

## 2024-01-29 ENCOUNTER — Encounter (HOSPITAL_COMMUNITY): Payer: Self-pay | Admitting: Orthopaedic Surgery

## 2024-01-29 ENCOUNTER — Ambulatory Visit (HOSPITAL_COMMUNITY): Admitting: Certified Registered Nurse Anesthetist

## 2024-01-29 ENCOUNTER — Observation Stay (HOSPITAL_COMMUNITY)
Admission: RE | Admit: 2024-01-29 | Discharge: 2024-02-05 | Disposition: A | Discharge status: Skilled Nursing Facility | Attending: Orthopaedic Surgery | Admitting: Orthopaedic Surgery

## 2024-01-29 ENCOUNTER — Observation Stay (HOSPITAL_COMMUNITY)

## 2024-01-29 ENCOUNTER — Encounter (HOSPITAL_COMMUNITY)
Admission: RE | Disposition: A | Discharge status: Skilled Nursing Facility | Payer: Self-pay | Source: Home / Self Care | Attending: Orthopaedic Surgery

## 2024-01-29 ENCOUNTER — Other Ambulatory Visit: Payer: Self-pay

## 2024-01-29 DIAGNOSIS — E119 Type 2 diabetes mellitus without complications: Secondary | ICD-10-CM | POA: Insufficient documentation

## 2024-01-29 DIAGNOSIS — M1712 Unilateral primary osteoarthritis, left knee: Principal | ICD-10-CM | POA: Diagnosis present

## 2024-01-29 DIAGNOSIS — Z01818 Encounter for other preprocedural examination: Secondary | ICD-10-CM

## 2024-01-29 DIAGNOSIS — Z7984 Long term (current) use of oral hypoglycemic drugs: Secondary | ICD-10-CM

## 2024-01-29 DIAGNOSIS — Z853 Personal history of malignant neoplasm of breast: Secondary | ICD-10-CM | POA: Insufficient documentation

## 2024-01-29 DIAGNOSIS — Z8616 Personal history of COVID-19: Secondary | ICD-10-CM | POA: Insufficient documentation

## 2024-01-29 DIAGNOSIS — Z96652 Presence of left artificial knee joint: Secondary | ICD-10-CM

## 2024-01-29 HISTORY — PX: TOTAL KNEE ARTHROPLASTY: SHX125

## 2024-01-29 LAB — GLUCOSE, CAPILLARY
Glucose-Capillary: 162 mg/dL — ABNORMAL HIGH (ref 70–99)
Glucose-Capillary: 135 mg/dL — ABNORMAL HIGH (ref 70–99)
Glucose-Capillary: 197 mg/dL — ABNORMAL HIGH (ref 70–99)
Glucose-Capillary: 250 mg/dL — ABNORMAL HIGH (ref 70–99)
Glucose-Capillary: 250 mg/dL — ABNORMAL HIGH (ref 70–99)

## 2024-01-29 LAB — TYPE AND SCREEN
ABO/RH(D): O POS
Antibody Screen: NEGATIVE

## 2024-01-29 LAB — ABO/RH: ABO/RH(D): O POS

## 2024-01-29 SURGERY — ARTHROPLASTY, KNEE, TOTAL
Anesthesia: Monitor Anesthesia Care | Site: Knee | Laterality: Left

## 2024-01-29 MED ORDER — SODIUM CHLORIDE 0.9 % IR SOLN
Status: DC | PRN
  Administered 2024-01-29: 1000 mL

## 2024-01-29 MED ORDER — LIDOCAINE 2% (20 MG/ML) 5 ML SYRINGE
INTRAMUSCULAR | Status: DC | PRN
  Administered 2024-01-29: 60 mg via INTRAVENOUS

## 2024-01-29 MED ORDER — FENTANYL CITRATE (PF) 100 MCG/2ML IJ SOLN
INTRAMUSCULAR | Status: AC
  Filled 2024-01-29: qty 2

## 2024-01-29 MED ORDER — METHOCARBAMOL 1000 MG/10ML IJ SOLN
INTRAMUSCULAR | Status: AC
  Filled 2024-01-29: qty 10

## 2024-01-29 MED ORDER — MINOXIDIL 2.5 MG PO TABS
2.5000 mg | ORAL_TABLET | Freq: Every day | ORAL | Status: DC
  Administered 2024-01-30 – 2024-02-05 (×7): 2.5 mg via ORAL
  Filled 2024-01-29 (×7): qty 1

## 2024-01-29 MED ORDER — OXYCODONE HCL 5 MG PO TABS
10.0000 mg | ORAL_TABLET | ORAL | Status: DC | PRN
  Administered 2024-01-30 – 2024-02-02 (×9): 15 mg via ORAL
  Administered 2024-02-02: 10 mg via ORAL
  Administered 2024-02-02: 15 mg via ORAL
  Administered 2024-02-04 (×3): 10 mg via ORAL
  Filled 2024-01-29 (×3): qty 3
  Filled 2024-01-29: qty 2
  Filled 2024-01-29 (×2): qty 3
  Filled 2024-01-29: qty 2
  Filled 2024-01-29 (×4): qty 3

## 2024-01-29 MED ORDER — HYDROMORPHONE HCL 1 MG/ML IJ SOLN
INTRAMUSCULAR | Status: AC
  Filled 2024-01-29: qty 1

## 2024-01-29 MED ORDER — LACTATED RINGERS IV SOLN: INTRAVENOUS | Status: DC

## 2024-01-29 MED ORDER — VITAMIN D 25 MCG (1000 UNIT) PO TABS
1000.0000 [IU] | ORAL_TABLET | Freq: Every day | ORAL | Status: DC
  Administered 2024-01-30 – 2024-02-05 (×7): 1000 [IU] via ORAL
  Filled 2024-01-29 (×7): qty 1

## 2024-01-29 MED ORDER — ONDANSETRON HCL 4 MG/2ML IJ SOLN
4.0000 mg | Freq: Four times a day (QID) | INTRAMUSCULAR | Status: DC | PRN
  Administered 2024-01-29 – 2024-02-01 (×3): 4 mg via INTRAVENOUS
  Filled 2024-01-29 (×3): qty 2

## 2024-01-29 MED ORDER — MENTHOL 3 MG MT LOZG: 1.0000 | LOZENGE | OROMUCOSAL | Status: DC | PRN

## 2024-01-29 MED ORDER — METOCLOPRAMIDE HCL 5 MG/ML IJ SOLN
5.0000 mg | Freq: Three times a day (TID) | INTRAMUSCULAR | Status: DC | PRN
  Administered 2024-01-30: 10 mg via INTRAVENOUS
  Filled 2024-01-29: qty 2

## 2024-01-29 MED ORDER — AMISULPRIDE (ANTIEMETIC) 5 MG/2ML IV SOLN: 10.0000 mg | Freq: Once | INTRAVENOUS | Status: DC | PRN

## 2024-01-29 MED ORDER — 0.9 % SODIUM CHLORIDE (POUR BTL) OPTIME
TOPICAL | Status: DC | PRN
  Administered 2024-01-29: 1000 mL

## 2024-01-29 MED ORDER — OXYCODONE HCL 5 MG PO TABS
5.0000 mg | ORAL_TABLET | ORAL | Status: DC | PRN
  Administered 2024-01-29 – 2024-01-30 (×4): 10 mg via ORAL
  Administered 2024-02-02 – 2024-02-03 (×3): 5 mg via ORAL
  Administered 2024-02-03: 10 mg via ORAL
  Filled 2024-01-29: qty 1
  Filled 2024-01-29: qty 2
  Filled 2024-01-29: qty 1
  Filled 2024-01-29 (×4): qty 2
  Filled 2024-01-29: qty 1
  Filled 2024-01-29: qty 2
  Filled 2024-01-29 (×2): qty 1
  Filled 2024-01-29: qty 2
  Filled 2024-01-29: qty 1

## 2024-01-29 MED ORDER — FENTANYL CITRATE (PF) 50 MCG/ML IJ SOSY
25.0000 ug | PREFILLED_SYRINGE | INTRAMUSCULAR | Status: DC | PRN
  Administered 2024-01-29 (×3): 50 ug via INTRAVENOUS

## 2024-01-29 MED ORDER — PANTOPRAZOLE SODIUM 40 MG PO TBEC
40.0000 mg | DELAYED_RELEASE_TABLET | Freq: Every day | ORAL | Status: DC
  Administered 2024-01-30 – 2024-02-05 (×7): 40 mg via ORAL
  Filled 2024-01-29 (×7): qty 1

## 2024-01-29 MED ORDER — VITAMIN E 45 MG (100 UNIT) PO CAPS
400.0000 [IU] | ORAL_CAPSULE | Freq: Every day | ORAL | Status: DC
  Administered 2024-01-30 – 2024-02-05 (×7): 400 [IU] via ORAL
  Filled 2024-01-29 (×7): qty 4

## 2024-01-29 MED ORDER — PIOGLITAZONE HCL 15 MG PO TABS
30.0000 mg | ORAL_TABLET | Freq: Every day | ORAL | Status: DC
  Administered 2024-01-30 – 2024-02-05 (×7): 30 mg via ORAL
  Filled 2024-01-29 (×7): qty 2

## 2024-01-29 MED ORDER — OXYCODONE HCL 5 MG/5ML PO SOLN: 5.0000 mg | Freq: Once | ORAL | Status: AC | PRN

## 2024-01-29 MED ORDER — LETROZOLE 2.5 MG PO TABS
2.5000 mg | ORAL_TABLET | Freq: Every day | ORAL | Status: DC
  Administered 2024-01-30 – 2024-02-05 (×7): 2.5 mg via ORAL
  Filled 2024-01-29 (×7): qty 1

## 2024-01-29 MED ORDER — SODIUM CHLORIDE 0.9 % IV SOLN: INTRAVENOUS | Status: AC

## 2024-01-29 MED ORDER — ACETAMINOPHEN 10 MG/ML IV SOLN
INTRAVENOUS | Status: DC | PRN
  Administered 2024-01-29: 1000 mg via INTRAVENOUS

## 2024-01-29 MED ORDER — DOCUSATE SODIUM 100 MG PO CAPS
100.0000 mg | ORAL_CAPSULE | Freq: Two times a day (BID) | ORAL | Status: DC
  Administered 2024-01-29 – 2024-02-05 (×14): 100 mg via ORAL
  Filled 2024-01-29 (×14): qty 1

## 2024-01-29 MED ORDER — BUPIVACAINE-EPINEPHRINE 0.25% -1:200000 IJ SOLN
INTRAMUSCULAR | Status: DC | PRN
  Administered 2024-01-29: 30 mL

## 2024-01-29 MED ORDER — FENTANYL CITRATE (PF) 50 MCG/ML IJ SOSY
50.0000 ug | PREFILLED_SYRINGE | Freq: Once | INTRAMUSCULAR | Status: AC
  Administered 2024-01-29: 100 ug via INTRAVENOUS
  Filled 2024-01-29: qty 2

## 2024-01-29 MED ORDER — DIPHENHYDRAMINE HCL 12.5 MG/5ML PO ELIX
12.5000 mg | ORAL_SOLUTION | ORAL | Status: DC | PRN
  Administered 2024-01-29: 25 mg via ORAL
  Administered 2024-01-29 – 2024-01-30 (×2): 12.5 mg via ORAL
  Administered 2024-02-02 – 2024-02-04 (×2): 25 mg via ORAL
  Filled 2024-01-29 (×5): qty 10

## 2024-01-29 MED ORDER — ROPIVACAINE HCL 7.5 MG/ML IJ SOLN
INTRAMUSCULAR | Status: DC | PRN
  Administered 2024-01-29: 20 mL via PERINEURAL

## 2024-01-29 MED ORDER — INSULIN ASPART 100 UNIT/ML IJ SOLN: 0.0000 [IU] | INTRAMUSCULAR | Status: DC | PRN

## 2024-01-29 MED ORDER — OXYCODONE HCL 5 MG PO TABS
5.0000 mg | ORAL_TABLET | Freq: Once | ORAL | Status: AC | PRN
  Administered 2024-01-29: 5 mg via ORAL

## 2024-01-29 MED ORDER — FENTANYL CITRATE (PF) 100 MCG/2ML IJ SOLN
INTRAMUSCULAR | Status: DC | PRN
  Administered 2024-01-29 (×3): 100 ug via INTRAVENOUS

## 2024-01-29 MED ORDER — METOCLOPRAMIDE HCL 5 MG PO TABS: 5.0000 mg | ORAL_TABLET | Freq: Three times a day (TID) | ORAL | Status: DC | PRN

## 2024-01-29 MED ORDER — STERILE WATER FOR IRRIGATION IR SOLN
Status: DC | PRN
  Administered 2024-01-29: 2000 mL

## 2024-01-29 MED ORDER — ATORVASTATIN CALCIUM 40 MG PO TABS
40.0000 mg | ORAL_TABLET | Freq: Every day | ORAL | Status: DC
  Administered 2024-01-30 – 2024-02-05 (×7): 40 mg via ORAL
  Filled 2024-01-29 (×7): qty 1

## 2024-01-29 MED ORDER — METFORMIN HCL 500 MG PO TABS
500.0000 mg | ORAL_TABLET | Freq: Two times a day (BID) | ORAL | Status: DC
  Administered 2024-01-29 – 2024-02-05 (×14): 500 mg via ORAL
  Filled 2024-01-29 (×14): qty 1

## 2024-01-29 MED ORDER — METHOCARBAMOL 1000 MG/10ML IJ SOLN
500.0000 mg | Freq: Four times a day (QID) | INTRAMUSCULAR | Status: DC | PRN
  Administered 2024-01-29: 500 mg via INTRAVENOUS

## 2024-01-29 MED ORDER — PROPOFOL 10 MG/ML IV BOLUS
INTRAVENOUS | Status: DC | PRN
  Administered 2024-01-29: 150 mg via INTRAVENOUS

## 2024-01-29 MED ORDER — OXYCODONE HCL 5 MG PO TABS
ORAL_TABLET | ORAL | Status: AC
  Filled 2024-01-29: qty 1

## 2024-01-29 MED ORDER — ALUM & MAG HYDROXIDE-SIMETH 200-200-20 MG/5ML PO SUSP: 30.0000 mL | ORAL | Status: DC | PRN

## 2024-01-29 MED ORDER — HYDROMORPHONE HCL 1 MG/ML IJ SOLN
0.5000 mg | INTRAMUSCULAR | Status: DC | PRN
  Administered 2024-01-29: 0.5 mg via INTRAVENOUS
  Administered 2024-01-30 – 2024-02-01 (×2): 1 mg via INTRAVENOUS
  Filled 2024-01-29 (×3): qty 1

## 2024-01-29 MED ORDER — ONDANSETRON HCL 4 MG PO TABS
4.0000 mg | ORAL_TABLET | Freq: Four times a day (QID) | ORAL | Status: DC | PRN
  Administered 2024-02-02 – 2024-02-03 (×2): 4 mg via ORAL
  Filled 2024-01-29 (×2): qty 1

## 2024-01-29 MED ORDER — TRANEXAMIC ACID-NACL 1000-0.7 MG/100ML-% IV SOLN
1000.0000 mg | INTRAVENOUS | Status: AC
  Administered 2024-01-29: 1000 mg via INTRAVENOUS
  Filled 2024-01-29: qty 100

## 2024-01-29 MED ORDER — HYDROMORPHONE HCL 1 MG/ML IJ SOLN
0.2500 mg | INTRAMUSCULAR | Status: DC | PRN
  Administered 2024-01-29 (×2): 0.5 mg via INTRAVENOUS

## 2024-01-29 MED ORDER — FENTANYL CITRATE (PF) 50 MCG/ML IJ SOSY
PREFILLED_SYRINGE | INTRAMUSCULAR | Status: AC
  Filled 2024-01-29: qty 3

## 2024-01-29 MED ORDER — ACETAMINOPHEN 325 MG PO TABS
325.0000 mg | ORAL_TABLET | Freq: Four times a day (QID) | ORAL | Status: DC | PRN
  Administered 2024-01-30 – 2024-02-02 (×4): 650 mg via ORAL
  Filled 2024-01-29 (×4): qty 2

## 2024-01-29 MED ORDER — ACETAMINOPHEN 10 MG/ML IV SOLN
INTRAVENOUS | Status: AC
  Filled 2024-01-29: qty 100

## 2024-01-29 MED ORDER — CHLORHEXIDINE GLUCONATE 0.12 % MT SOLN
15.0000 mL | Freq: Once | OROMUCOSAL | Status: AC
  Administered 2024-01-29: 15 mL via OROMUCOSAL

## 2024-01-29 MED ORDER — PHENOL 1.4 % MT LIQD: 1.0000 | OROMUCOSAL | Status: DC | PRN

## 2024-01-29 MED ORDER — DEXAMETHASONE SOD PHOSPHATE PF 10 MG/ML IJ SOLN
INTRAMUSCULAR | Status: DC | PRN
  Administered 2024-01-29 (×2): 10 mg

## 2024-01-29 MED ORDER — ORAL CARE MOUTH RINSE: 15.0000 mL | Freq: Once | OROMUCOSAL | Status: AC

## 2024-01-29 MED ORDER — POVIDONE-IODINE 10 % EX SWAB
2.0000 | Freq: Once | CUTANEOUS | Status: AC
  Administered 2024-01-29: 2 via TOPICAL

## 2024-01-29 MED ORDER — METHOCARBAMOL 500 MG PO TABS
500.0000 mg | ORAL_TABLET | Freq: Four times a day (QID) | ORAL | Status: DC | PRN
  Administered 2024-01-30 – 2024-02-05 (×15): 500 mg via ORAL
  Filled 2024-01-29 (×15): qty 1

## 2024-01-29 MED ORDER — ASPIRIN 81 MG PO CHEW
81.0000 mg | CHEWABLE_TABLET | Freq: Two times a day (BID) | ORAL | Status: DC
  Administered 2024-01-29 – 2024-02-05 (×14): 81 mg via ORAL
  Filled 2024-01-29 (×14): qty 1

## 2024-01-29 MED ORDER — CEFAZOLIN SODIUM-DEXTROSE 2-4 GM/100ML-% IV SOLN
2.0000 g | INTRAVENOUS | Status: AC
  Administered 2024-01-29: 2 g via INTRAVENOUS
  Filled 2024-01-29: qty 100

## 2024-01-29 MED ORDER — BUPIVACAINE-EPINEPHRINE (PF) 0.25% -1:200000 IJ SOLN
INTRAMUSCULAR | Status: AC
  Filled 2024-01-29: qty 30

## 2024-01-29 MED ORDER — ONDANSETRON HCL 4 MG/2ML IJ SOLN
INTRAMUSCULAR | Status: DC | PRN
  Administered 2024-01-29: 4 mg via INTRAVENOUS

## 2024-01-29 MED ORDER — CEFAZOLIN SODIUM-DEXTROSE 2-4 GM/100ML-% IV SOLN
2.0000 g | Freq: Four times a day (QID) | INTRAVENOUS | Status: AC
  Administered 2024-01-29 – 2024-01-30 (×2): 2 g via INTRAVENOUS
  Filled 2024-01-29 (×2): qty 100

## 2024-01-29 SURGICAL SUPPLY — 50 items
BAG COUNTER SPONGE SURGICOUNT (BAG) IMPLANT
BAG ZIPLOCK 12X15 (MISCELLANEOUS) ×1 IMPLANT
BENZOIN TINCTURE PRP APPL 2/3 (GAUZE/BANDAGES/DRESSINGS) IMPLANT
BLADE SAG 18X100X1.27 (BLADE) ×1 IMPLANT
BNDG ELASTIC 6INX 5YD STR LF (GAUZE/BANDAGES/DRESSINGS) ×2 IMPLANT
BOWL SMART MIX CTS (DISPOSABLE) IMPLANT
CEMENT BONE R 1X40 (Cement) IMPLANT
COMPONENT FEM CMT PRSN STD SZ7 (Knees) IMPLANT
COMPONET TIB PS KNEE E 0D LT (Joint) IMPLANT
COOLER ICEMAN CLASSIC (MISCELLANEOUS) ×1 IMPLANT
COVER SURGICAL LIGHT HANDLE (MISCELLANEOUS) ×1 IMPLANT
CUFF TRNQT CYL 34X4.125X (TOURNIQUET CUFF) ×1 IMPLANT
DRAPE U-SHAPE 47X51 STRL (DRAPES) ×1 IMPLANT
DURAPREP 26ML APPLICATOR (WOUND CARE) ×1 IMPLANT
ELECT BLADE TIP CTD 4 INCH (ELECTRODE) ×1 IMPLANT
ELECT PENCIL ROCKER SW 15FT (MISCELLANEOUS) ×1 IMPLANT
ELECT REM PT RETURN 15FT ADLT (MISCELLANEOUS) ×1 IMPLANT
GAUZE PAD ABD 8X10 STRL (GAUZE/BANDAGES/DRESSINGS) ×2 IMPLANT
GAUZE SPONGE 4X4 12PLY STRL (GAUZE/BANDAGES/DRESSINGS) ×1 IMPLANT
GAUZE XEROFORM 1X8 LF (GAUZE/BANDAGES/DRESSINGS) IMPLANT
GLOVE BIO SURGEON STRL SZ7.5 (GLOVE) ×1 IMPLANT
GLOVE BIOGEL PI IND STRL 8 (GLOVE) ×2 IMPLANT
GLOVE SURG ORTHO 8.0 STRL STRW (GLOVE) ×1 IMPLANT
GOWN STRL REUS W/ TWL XL LVL3 (GOWN DISPOSABLE) ×2 IMPLANT
HOLDER FOLEY CATH W/STRAP (MISCELLANEOUS) IMPLANT
IMMOBILIZER KNEE 20 THIGH 36 (SOFTGOODS) ×1 IMPLANT
KIT TURNOVER KIT A (KITS) ×1 IMPLANT
MANIFOLD NEPTUNE II (INSTRUMENTS) ×1 IMPLANT
NS IRRIG 1000ML POUR BTL (IV SOLUTION) ×1 IMPLANT
PACK TOTAL KNEE CUSTOM (KITS) ×1 IMPLANT
PAD COLD SHLDR WRAP-ON (PAD) ×1 IMPLANT
PADDING CAST COTTON 6X4 STRL (CAST SUPPLIES) ×2 IMPLANT
PIN DRILL HDLS TROCAR 75 4PK (PIN) IMPLANT
PROTECTOR NERVE ULNAR (MISCELLANEOUS) ×1 IMPLANT
SCREW FEMALE HEX FIX 25X2.5 (ORTHOPEDIC DISPOSABLE SUPPLIES) IMPLANT
SET HNDPC FAN SPRY TIP SCT (DISPOSABLE) ×1 IMPLANT
SET PAD KNEE POSITIONER (MISCELLANEOUS) ×1 IMPLANT
SPIKE FLUID TRANSFER (MISCELLANEOUS) IMPLANT
STAPLER SKIN PROX 35W (STAPLE) IMPLANT
STEM POLY PAT PLY 29M KNEE (Knees) IMPLANT
STEM TIBIAL SZ6-7 EF 12 LT (Knees) IMPLANT
STRIP CLOSURE SKIN 1/2X4 (GAUZE/BANDAGES/DRESSINGS) IMPLANT
SUT MNCRL AB 4-0 PS2 18 (SUTURE) IMPLANT
SUT VIC AB 0 CT1 36 (SUTURE) ×1 IMPLANT
SUT VIC AB 1 CT1 36 (SUTURE) ×2 IMPLANT
SUT VIC AB 2-0 CT1 TAPERPNT 27 (SUTURE) ×2 IMPLANT
TOWEL GREEN STERILE FF (TOWEL DISPOSABLE) ×1 IMPLANT
TRAY FOLEY MTR SLVR 16FR STAT (SET/KITS/TRAYS/PACK) IMPLANT
WATER STERILE IRR 1000ML POUR (IV SOLUTION) ×2 IMPLANT
YANKAUER SUCT BULB TIP NO VENT (SUCTIONS) ×1 IMPLANT

## 2024-01-29 NOTE — Transfer of Care (Signed)
 Immediate Anesthesia Transfer of Care Note  Patient: Brandy Guerrero  Procedure(s) Performed: ARTHROPLASTY, KNEE, TOTAL (Left: Knee)  Patient Location: PACU  Anesthesia Type:Spinal  Level of Consciousness: sedated, patient cooperative, and responds to stimulation  Airway & Oxygen Therapy: Patient Spontanous Breathing and Patient connected to face mask oxygen  Post-op Assessment: Report given to RN and Post -op Vital signs reviewed and stable  Post vital signs: Reviewed and stable  Last Vitals:  Vitals Value Taken Time  BP    Temp    Pulse    Resp    SpO2      Last Pain:  Vitals:   01/29/24 1212  TempSrc:   PainSc: 0-No pain         Complications: No notable events documented.

## 2024-01-29 NOTE — Anesthesia Postprocedure Evaluation (Signed)
"   Anesthesia Post Note  Patient: Brandy Guerrero  Procedure(s) Performed: ARTHROPLASTY, KNEE, TOTAL (Left: Knee)     Patient location during evaluation: PACU Anesthesia Type: Regional and General Level of consciousness: awake Pain management: pain level controlled Vital Signs Assessment: post-procedure vital signs reviewed and stable Respiratory status: spontaneous breathing, nonlabored ventilation and respiratory function stable Cardiovascular status: blood pressure returned to baseline and stable Postop Assessment: no apparent nausea or vomiting Anesthetic complications: no   No notable events documented.  Last Vitals:  Vitals:   01/29/24 1615 01/29/24 1630  BP: (!) 162/70 (!) 149/66  Pulse: 80 83  Resp: 15 16  Temp:    SpO2: 95% (!) 88%    Last Pain:  Vitals:   01/29/24 1630  TempSrc:   PainSc: Asleep                 [name]      "

## 2024-01-29 NOTE — Plan of Care (Signed)

## 2024-01-29 NOTE — Anesthesia Preprocedure Evaluation (Signed)
"                                    Anesthesia Evaluation  Patient identified by MRN, date of birth, ID band Patient awake    Reviewed: Allergy & Precautions, NPO status , Patient's Chart, lab work & pertinent test results  Airway Mallampati: II  TM Distance: >3 FB Neck ROM: Full    Dental no notable dental hx.    Pulmonary neg pulmonary ROS   Pulmonary exam normal        Cardiovascular negative cardio ROS  Rhythm:Regular Rate:Normal     Neuro/Psych   Anxiety     negative neurological ROS     GI/Hepatic Neg liver ROS,GERD  ,,  Endo/Other  diabetes, Type 2, Oral Hypoglycemic Agents    Renal/GU negative Renal ROS  negative genitourinary   Musculoskeletal  (+) Arthritis , Osteoarthritis,    Abdominal Normal abdominal exam  (+)   Peds  Hematology  (+) Blood dyscrasia, anemia   Anesthesia Other Findings   Reproductive/Obstetrics                              Anesthesia Physical Anesthesia Plan  ASA: 3  Anesthesia Plan: MAC, Regional and Spinal   Post-op Pain Management: Regional block*   Induction: Intravenous  PONV Risk Score and Plan: 2 and Ondansetron , Dexamethasone , Propofol  infusion and Treatment may vary due to age or medical condition  Airway Management Planned: Simple Face Mask and Nasal Cannula  Additional Equipment: None  Intra-op Plan:   Post-operative Plan:   Informed Consent: I have reviewed the patients History and Physical, chart, labs and discussed the procedure including the risks, benefits and alternatives for the proposed anesthesia with the patient or authorized representative who has indicated his/her understanding and acceptance.     Dental advisory given  Plan Discussed with: CRNA  Anesthesia Plan Comments:         Anesthesia Quick Evaluation  "

## 2024-01-29 NOTE — Interval H&P Note (Signed)
 History and Physical Interval Note: The patient understands that she is here today for a left total knee replacement to treat her significant left knee pain and arthritis.  There has been no significant or interval change in her medical status.  The risks and benefits of surgery have been discussed in detail and informed consent has been obtained.  The left operative knee has been marked.  01/29/2024 11:42 AM  Brandy Guerrero  has presented today for surgery, with the diagnosis of osteoarthritis left knee.  The various methods of treatment have been discussed with the patient and family. After consideration of risks, benefits and other options for treatment, the patient has consented to  Procedures: ARTHROPLASTY, KNEE, TOTAL (Left) as a surgical intervention.  The patient's history has been reviewed, patient examined, no change in status, stable for surgery.  I have reviewed the patient's chart and labs.  Questions were answered to the patient's satisfaction.     Brandy Guerrero

## 2024-01-29 NOTE — Op Note (Signed)
 "    Operative Note  Date of operation: 01/29/2024 Preoperative diagnosis: Left knee primary osteoarthritis Postoperative diagnosis: Same  Procedure: Left cemented total knee arthroplasty  Implants: Biomet/Zimmer persona cemented knee system (CR medial congruent) Implant Name Type Inv. Item Serial No. Manufacturer Lot No. LRB No. Used Action  CEMENT BONE R 1X40 - [id number] Cement CEMENT BONE R 1X40  ZIMMER [employer]ON(ORTH,TRAU,BIO,SG) [id number] Left 2 Implanted  COMPONET TIB PS KNEE E 0D LT - [id number] Joint COMPONET TIB PS KNEE E 0D LT  ZIMMER [employer]ON(ORTH,TRAU,BIO,SG) [id number] Left 1 Implanted  STEM POLY PAT PLY 50M KNEE - [id number] Knees STEM POLY PAT PLY 50M KNEE  ZIMMER RECON(ORTH,TRAU,BIO,SG) [id number] Left 1 Implanted  STEM TIBIAL SZ6-7 EF 12 LT - [id number] Knees STEM TIBIAL SZ6-7 EF 12 LT  ZIMMER RECON(ORTH,TRAU,BIO,SG) [id number] Left 1 Implanted  COMPONENT FEM CMT PRSN STD SZ7 - [id number] Knees COMPONENT FEM CMT PRSN STD SZ7  ZIMMER [employer]ON(ORTH,TRAU,BIO,SG) [id number] Left 1 Implanted   Surgeon: Lonni GRADE. Vernetta, MD Assistant: Randine Abbot, RNFA  Anesthesia: #1 right lower extremity adductor canal block, #2 General, #3 local Tourniquet time: Under 1 hour EBL: Less than 50 cc Antibiotics: IV Ancef  Complications: None  Indications: The patient is a 73 year old female well-known to us .  She has debilitating arthritis involving her left knee.  This is been hurting for a very long period of time and she is at the point where it is detrimentally affecting her mobility, her actives of daily living and her quality of life and she does wish proceed with a knee replacement.  She has tried and failed conservative treatment for a long period of time.  With surgery she understands the risk of acute blood loss anemia, nerve and vessel injury, fracture, infection, DVT, implant failure and wound healing issues.  She understands that our goals are hopefully decreased pain, improved mobility and  improve quality of life.  Procedure description: After informed consent was obtained and the appropriate left knee was marked, anesthesia obtained a left lower extremity adductor canal block in the holding room.  The patient was then brought to the operating room and set up on the operating table where spinal anesthesia was attempted but not successful.  She was then laid in the supine position on the operating table and general anesthesia was then obtained.  A nonsterile tourniquet was placed around her upper left thigh and her left thigh, knee, leg and ankle were prepped and draped with DuraPrep and sterile drapes including a sterile stockinette.  A timeout was called and she was identified as the correct patient and the correct left knee.  An Esmarch was then used to wrap out the leg and the tourniquet was inflated to 300 mm pressure.  With the knee extended a direct midline incision was made over the patella and carried proximally distally.  Dissection was carried down to the knee joint and a medial parapatellar arthrotomy was made finding a moderate joint effusion.  With the knee in a flexed position we removed the ACL and medial lateral meniscus.  There was osteophytes the removed from all 3 compartments and we found significant cartilage wear in the knee.  We then used an extra majorly base cutting guide for making her proximal tibia cut correcting for varus and valgus and a 7 degree slope.  We made this cut to take 2 mm off the low side and we did back it down to more millimeters.  We then used the intramedullary base cutting guide for distal femur  cut setting this for a left knee at 5 degrees actually rotated.  We made this cut to take 10 mm off the distal femur cut.  We brought the knee back down to full extension and had achieved full extension with a 10 mm block.  We then back to the femur and put a femoral sizing guide based off the epicondylar axis and Whitesides line.  Based off of that we chose a  size 7 femur.  We put a 4-in-1 cutting block for a size 7 femur and made our anterior and posterior cuts followed our chamfer cuts.  We then backed the tibia and chose a size E left tibial tray for coverage over the tibial plateaus and the rotation of the tibial tubercle on the femur.  We did our drill hole and keel punch off of this.  We then trialed our size E left tibia combined with her size 7 left CR standard femur.  We trialed up to a 12 mm thickness left medial congruent path and insert we are pleased with range of motion and stability without insert.  We then made a patella cut and drilled 3 holes for a size 29 diameter patella button.  We are pleased with range of motion and stability with all trial implants.  We then removed all trial implants and irrigate the knee with normal saline solution.  We then placed Marcaine  with epinephrine  around the arthrotomy.  Next the cement was mixed in with the knee in a flexed position we cemented our Biomet/Zimmer persona tibial tray for a left knee size E followed by cementing our size 7 left CR standard femur.  We cleaned excess cement debris from the knee and placed our 12 mm thickness left medial congruent polythene insert.  We then cemented our size 29 patella button.  We then held the knee fully extended and compressed while the cemented hardened.  Once the cemented hardened the tourniquet was let down and hemostasis was obtained with electrocautery.  The arthrotomy was then closed with interrupted #1 Ethibond suture followed by normal Vicryl to close the tensor fascia.  0 Vicryl was used to close the deep tissue and 2-0 Vicryl was used to close subcutaneous tissue.  The skin was closed with staples.  Well-padded sterile dressing was applied.  The patient was awakened, extubated and taken to the recovery room. "

## 2024-01-29 NOTE — Anesthesia Procedure Notes (Signed)
 Anesthesia Regional Block: Adductor canal block   Pre-Anesthetic Checklist: , timeout performed,  Correct Patient, Correct Site, Correct Laterality,  Correct Procedure, Correct Position, site marked,  Risks and benefits discussed,  Surgical consent,  Pre-op  evaluation,  At surgeon's request and post-op pain management  Laterality: Left  Prep: Dura Prep       Needles:  Injection technique: Single-shot  Needle Type: Echogenic Stimulator Needle     Needle Length: 10cm  Needle Gauge: 20     Additional Needles:   Procedures:,,,, ultrasound used (permanent image in chart),,    Narrative:  Start time: 01/29/2024 12:07 PM End time: 01/29/2024 12:10 PM Injection made incrementally with aspirations every 5 mL.  Performed by: Personally  Anesthesiologist: Dorethea Cordella SQUIBB, DO  Additional Notes: Patient identified. Risks/Benefits/Options discussed with patient including but not limited to bleeding, infection, nerve damage, failed block, incomplete pain control. Patient expressed understanding and wished to proceed. All questions were answered. Sterile technique was used throughout the entire procedure. Please see nursing notes for vital signs. Aspirated in 5cc intervals with injection for negative confirmation. Patient was given instructions on fall risk and not to get out of bed. All questions and concerns addressed with instructions to call with any issues or inadequate analgesia.

## 2024-01-29 NOTE — Anesthesia Procedure Notes (Signed)
 Procedure Name: LMA Insertion Date/Time: 01/29/2024 1:38 PM  Performed by: Cindie Charleen PARAS, CRNAPre-anesthesia Checklist: Patient identified, Suction available, Emergency Drugs available, Patient being monitored and Timeout performed Patient Re-evaluated:Patient Re-evaluated prior to induction Oxygen Delivery Method: Circle system utilized Preoxygenation: Pre-oxygenation with 100% oxygen Induction Type: IV induction Ventilation: Mask ventilation without difficulty LMA: LMA inserted LMA Size: 4.0 Number of attempts: 1 Placement Confirmation: ETT inserted through vocal cords under direct vision and breath sounds checked- equal and bilateral Tube secured with: Tape Dental Injury: Teeth and Oropharynx as per pre-operative assessment

## 2024-01-30 DIAGNOSIS — M1712 Unilateral primary osteoarthritis, left knee: Secondary | ICD-10-CM | POA: Diagnosis not present

## 2024-01-30 LAB — CBC
HCT: 34 % — ABNORMAL LOW (ref 36.0–46.0)
Hemoglobin: 10.6 g/dL — ABNORMAL LOW (ref 12.0–15.0)
MCH: 30.1 pg (ref 26.0–34.0)
MCHC: 31.2 g/dL (ref 30.0–36.0)
MCV: 96.6 fL (ref 80.0–100.0)
Platelets: 254 K/uL (ref 150–400)
RBC: 3.52 MIL/uL — ABNORMAL LOW (ref 3.87–5.11)
RDW: 12.1 % (ref 11.5–15.5)
WBC: 15.6 K/uL — ABNORMAL HIGH (ref 4.0–10.5)
nRBC: 0 % (ref 0.0–0.2)

## 2024-01-30 LAB — GLUCOSE, CAPILLARY
Glucose-Capillary: 172 mg/dL — ABNORMAL HIGH (ref 70–99)
Glucose-Capillary: 176 mg/dL — ABNORMAL HIGH (ref 70–99)
Glucose-Capillary: 175 mg/dL — ABNORMAL HIGH (ref 70–99)
Glucose-Capillary: 179 mg/dL — ABNORMAL HIGH (ref 70–99)

## 2024-01-30 LAB — BASIC METABOLIC PANEL WITH GFR
Anion gap: 12 (ref 5–15)
BUN: 10 mg/dL (ref 8–23)
CO2: 24 mmol/L (ref 22–32)
Calcium: 9.2 mg/dL (ref 8.9–10.3)
Chloride: 101 mmol/L (ref 98–111)
Creatinine, Ser: 0.68 mg/dL (ref 0.44–1.00)
GFR, Estimated: >60 mL/min
Glucose, Bld: 225 mg/dL — ABNORMAL HIGH (ref 70–99)
Potassium: 4.4 mmol/L (ref 3.5–5.1)
Sodium: 137 mmol/L (ref 135–145)

## 2024-01-30 NOTE — Progress Notes (Signed)
 Physical Therapy Treatment Patient Details Name: Brandy Guerrero MRN: [id number] DOB: Mar 31, 1950 Today's Date: 01/30/2024   History of Present Illness 73 yo female s/p L TKA on 01/29/24. PMH: OA, DM, obesity, umbilical hernia, afib    PT Comments  Pt limited by pain this afternoon. Agreeable to work with PT within limits however states pain is 10/10 despite meds.  Assisted pt back to bed followed by TKA HEP, ice to L knee EOS   If plan is discharge home, recommend the following: A little help with bathing/dressing/bathroom;A little help with walking and/or transfers   Can travel by private vehicle        Equipment Recommendations  Other (comment) (defer to SNF)    Recommendations for Other Services       Precautions / Restrictions Precautions Precautions: Fall;Knee Restrictions LLE Weight Bearing Per Provider Order: Weight bearing as tolerated     Mobility  Bed Mobility Overal bed mobility: Needs Assistance Bed Mobility: Sit to Supine     Supine to sit: Min assist, Contact guard Sit to supine: Min assist   General bed mobility comments: incr time and effort, assist to lift LLE on to bed    Transfers Overall transfer level: Needs assistance Equipment used: Rolling walker (2 wheels) Transfers: Sit to/from Stand, Bed to chair/wheelchair/BSC Sit to Stand: Contact guard assist, Min assist   Step pivot transfers: Contact guard assist, Min assist       General transfer comment: cues for hand placement. assist to steady    Ambulation/Gait Ambulation/Gait assistance: Contact guard assist Gait Distance (Feet): 60 Feet Assistive device: Rolling walker (2 wheels) Gait Pattern/deviations: Step-to pattern, Decreased stance time - left, Antalgic Gait velocity: decr     General Gait Details:  (deferred d/t 10/10 pain)   Stairs             Wheelchair Mobility     Tilt Bed    Modified Rankin (Stroke Patients Only)       Balance Overall balance  assessment: Needs assistance Sitting-balance support: Feet supported Sitting balance-Leahy Scale: Good     Standing balance support: During functional activity, Bilateral upper extremity supported, Single extremity supported, No upper extremity supported Standing balance-Leahy Scale: Fair                              Hotel Manager: No apparent difficulties  Cognition Arousal: Alert Behavior During Therapy: WFL for tasks assessed/performed   PT - Cognitive impairments: No apparent impairments                         Following commands: Intact      Cueing Cueing Techniques: Verbal cues  Exercises Total Joint Exercises Ankle Circles/Pumps: AROM, Both, 10 reps Quad Sets: AROM, Strengthening, Both, 10 reps Heel Slides: AAROM, Left, 10 reps Hip ABduction/ADduction: AROM, Left, 10 reps, AAROM Straight Leg Raises: AROM, Strengthening, Left, 10 reps    General Comments        Pertinent Vitals/Pain Pain Assessment Pain Assessment: 0-10 Pain Score: 10-Worst pain ever Faces Pain Scale: Hurts even more Pain Location: left knee Pain Descriptors / Indicators: Aching, Sore, Constant Pain Intervention(s): Limited activity within patient's tolerance, Monitored during session, Premedicated before session, Repositioned, Ice applied    Home Living Family/patient expects to be discharged to:: Skilled nursing facility Living Arrangements: Spouse/significant other Available Help at Discharge: Family Type of Home: House Home Access: Stairs to enter  Entrance Stairs-Number of Steps: threshold --  the thing an old house has            Prior Function            PT Goals (current goals can now be found in the care plan section) Acute Rehab PT Goals Patient Stated Goal: to go to rehab PT Goal Formulation: With patient Time For Goal Achievement: 02/12/24 Potential to Achieve Goals: Good Progress towards PT goals: Progressing  toward goals    Frequency    7X/week      PT Plan      Co-evaluation              AM-PAC PT 6 Clicks Mobility   Outcome Measure  Help needed turning from your back to your side while in a flat bed without using bedrails?: A Little Help needed moving from lying on your back to sitting on the side of a flat bed without using bedrails?: A Little Help needed moving to and from a bed to a chair (including a wheelchair)?: A Little Help needed standing up from a chair using your arms (e.g., wheelchair or bedside chair)?: A Little Help needed to walk in hospital room?: A Little Help needed climbing 3-5 steps with a railing? : A Little 6 Click Score: 18    End of Session Equipment Utilized During Treatment: Gait belt Activity Tolerance: Patient tolerated treatment well Patient left: with call bell/phone within reach;in bed;with bed alarm set Nurse Communication: Mobility status PT Visit Diagnosis: Other abnormalities of gait and mobility (R26.89)     Time: 8472-8457 PT Time Calculation (min) (ACUTE ONLY): 15 min  Charges:    $Therapeutic Exercise: 8-22 mins PT General Charges $$ ACUTE PT VISIT: 1 Visit                     Rexene, PT  Acute Rehab Dept [facility]) [phone]  01/30/2024    [facility] 01/30/2024, 4:43 PM

## 2024-01-30 NOTE — Evaluation (Signed)
 Physical Therapy Evaluation Patient Details Name: Brandy Guerrero MRN: [id number] DOB: September 27, 1950 Today's Date: 01/30/2024  History of Present Illness  73 yo female s/p L TKA on 01/29/24. PMH: OA, DM, obesity, umbilical hernia, afib  Clinical Impression  Pt is s/p TKA resulting in the deficits listed below (see PT Problem List).  Pt agreeable to PT despite c/o pain. Amb ~ 55' with RW and CGA. Pt states the plan is for SNF post acute   Pt will benefit from acute skilled PT to increase their independence and safety with mobility to allow discharge.          If plan is discharge home, recommend the following: A little help with bathing/dressing/bathroom;A little help with walking and/or transfers   Can travel by private vehicle        Equipment Recommendations Other (comment) (defer to SNF)  Recommendations for Other Services       Functional Status Assessment Patient has had a recent decline in their functional status and demonstrates the ability to make significant improvements in function in a reasonable and predictable amount of time.     Precautions / Restrictions Precautions Precautions: Fall;Knee Restrictions Weight Bearing Restrictions Per Provider Order: No LLE Weight Bearing Per Provider Order: Weight bearing as tolerated      Mobility  Bed Mobility Overal bed mobility: Needs Assistance Bed Mobility: Supine to Sit     Supine to sit: Min assist, Contact guard     General bed mobility comments: incr time and effort, light assist to progress LLE off bed    Transfers Overall transfer level: Needs assistance Equipment used: Rolling walker (2 wheels) Transfers: Sit to/from Stand Sit to Stand: Contact guard assist           General transfer comment: cues for hand placement    Ambulation/Gait Ambulation/Gait assistance: Contact guard assist Gait Distance (Feet): 60 Feet Assistive device: Rolling walker (2 wheels) Gait Pattern/deviations: Step-to  pattern, Decreased stance time - left, Antalgic Gait velocity: decr     General Gait Details: cues for sequence and RW position, CGA for safety  Stairs            Wheelchair Mobility     Tilt Bed    Modified Rankin (Stroke Patients Only)       Balance Overall balance assessment: Needs assistance Sitting-balance support: Feet supported Sitting balance-Leahy Scale: Good     Standing balance support: During functional activity, Bilateral upper extremity supported, Single extremity supported, No upper extremity supported Standing balance-Leahy Scale: Fair                               Pertinent Vitals/Pain Pain Assessment Pain Assessment: Faces Faces Pain Scale: Hurts even more Pain Descriptors / Indicators: Aching, Sore Pain Intervention(s): Limited activity within patient's tolerance, Monitored during session, Premedicated before session    Home Living Family/patient expects to be discharged to:: Skilled nursing facility Living Arrangements: Spouse/significant other Available Help at Discharge: Family Type of Home: House Home Access: Stairs to enter   [employer] of Steps: threshold --  the thing an old house has   Home Layout: One level        Prior Function Prior Level of Function : Independent/Modified Independent                     Extremity/Trunk Assessment   Upper Extremity Assessment Upper Extremity Assessment: Overall WFL for tasks assessed  Lower Extremity Assessment Lower Extremity Assessment: LLE deficits/detail LLE Deficits / Details: ankle WFL, knee grossly 3/5 with testing limited d/t pain.       Communication   Communication Communication: No apparent difficulties    Cognition Arousal: Alert Behavior During Therapy: WFL for tasks assessed/performed   PT - Cognitive impairments: No apparent impairments                         Following commands: Intact       Cueing Cueing  Techniques: Verbal cues     General Comments      Exercises Total Joint Exercises Ankle Circles/Pumps: AROM, Both, 5 reps   Assessment/Plan    PT Assessment Patient needs continued PT services  PT Problem List Decreased strength;Decreased mobility;Decreased knowledge of use of DME;Pain;Decreased activity tolerance;Decreased range of motion       PT Treatment Interventions DME instruction;Therapeutic activities;Gait training;Functional mobility training;Therapeutic exercise;Patient/family education    PT Goals (Current goals can be found in the Care Plan section)  Acute Rehab PT Goals Patient Stated Goal: to go to rehab PT Goal Formulation: With patient Time For Goal Achievement: 02/12/24 Potential to Achieve Goals: Good    Frequency 7X/week     Co-evaluation               AM-PAC PT 6 Clicks Mobility  Outcome Measure Help needed turning from your back to your side while in a flat bed without using bedrails?: A Little Help needed moving from lying on your back to sitting on the side of a flat bed without using bedrails?: A Little Help needed moving to and from a bed to a chair (including a wheelchair)?: A Little Help needed standing up from a chair using your arms (e.g., wheelchair or bedside chair)?: A Little Help needed to walk in hospital room?: A Little Help needed climbing 3-5 steps with a railing? : A Lot 6 Click Score: 17    End of Session Equipment Utilized During Treatment: Gait belt Activity Tolerance: Patient tolerated treatment well Patient left: with call bell/phone within reach;in chair;with chair alarm set Nurse Communication: Mobility status PT Visit Diagnosis: Other abnormalities of gait and mobility (R26.89)    Time: 8975-8953 PT Time Calculation (min) (ACUTE ONLY): 22 min   Charges:   PT Evaluation $PT Eval Low Complexity: 1 Low   PT General Charges $$ ACUTE PT VISIT: 1 Visit         Zaul Hubers, PT  Acute Rehab Dept ([facility])  [phone]  01/30/2024   [facility] 01/30/2024, 1:24 PM

## 2024-01-30 NOTE — Discharge Instructions (Signed)
 Per [facility] clinic policy, our goal is ensure optimal postoperative pain control with a multimodal pain management strategy. For all [facility] patients, our goal is to wean post-operative narcotic medications by 6 weeks post-operatively. If this is not possible due to utilization of pain medication prior to surgery, your [facility] doctor will support your acute post-operative pain control for the first 6 weeks postoperatively, with a plan to transition you back to your primary pain team following that. Brandy Guerrero will work to ensure a Therapist, occupational.  INSTRUCTIONS AFTER JOINT REPLACEMENT   Remove items at home which could result in a fall. This includes throw rugs or furniture in walking pathways ICE to the affected joint every three hours while awake for 30 minutes at a time, for at least the first 3-5 days, and then as needed for pain and swelling.  Continue to use ice for pain and swelling. You may notice swelling that will progress down to the foot and ankle.  This is normal after surgery.  Elevate your leg when you are not up walking on it.   Continue to use the breathing machine you got in the hospital (incentive spirometer) which will help keep your temperature down.  It is common for your temperature to cycle up and down following surgery, especially at night when you are not up moving around and exerting yourself.  The breathing machine keeps your lungs expanded and your temperature down.   DIET:  As you were doing prior to hospitalization, we recommend a well-balanced diet.  DRESSING / WOUND CARE / SHOWERING  Keep the surgical dressing until follow up.  The dressing is water  proof, so you can shower without any extra covering.  IF THE DRESSING FALLS OFF or the wound gets wet inside, change the dressing with sterile gauze.  Please use good hand washing techniques before changing the dressing.  Do not use any lotions or creams on the incision until instructed by your surgeon.     ACTIVITY  Increase activity slowly as tolerated, but follow the weight bearing instructions below.   No driving for 6 weeks or until further direction given by your physician.  You cannot drive while taking narcotics.  No lifting or carrying greater than 10 lbs. until further directed by your surgeon. Avoid periods of inactivity such as sitting longer than an hour when not asleep. This helps prevent blood clots.  You may return to work once you are authorized by your doctor.     WEIGHT BEARING   Weight bearing as tolerated with assist device (walker, cane, etc) as directed, use it as long as suggested by your surgeon or therapist, typically at least 4-6 weeks.   EXERCISES  Results after joint replacement surgery are often greatly improved when you follow the exercise, range of motion and muscle strengthening exercises prescribed by your doctor. Safety measures are also important to protect the joint from further injury. Any time any of these exercises cause you to have increased pain or swelling, decrease what you are doing until you are comfortable again and then slowly increase them. If you have problems or questions, call your caregiver or physical therapist for advice.   Rehabilitation is important following a joint replacement. After just a few days of immobilization, the muscles of the leg can become weakened and shrink (atrophy).  These exercises are designed to build up the tone and strength of the thigh and leg muscles and to improve motion. Often times heat used for twenty to thirty minutes before  working out will loosen up your tissues and help with improving the range of motion but do not use heat for the first two weeks following surgery (sometimes heat can increase post-operative swelling).   These exercises can be done on a training (exercise) mat, on the floor, on a table or on a bed. Use whatever works the best and is most comfortable for you.    Use music or television  while you are exercising so that the exercises are a pleasant break in your day. This will make your life better with the exercises acting as a break in your routine that you can look forward to.   Perform all exercises about fifteen times, three times per day or as directed.  You should exercise both the operative leg and the other leg as well.  Exercises include:   Quad Sets - Tighten up the muscle on the front of the thigh (Quad) and hold for 5-10 seconds.   Straight Leg Raises - With your knee straight (if you were given a brace, keep it on), lift the leg to 60 degrees, hold for 3 seconds, and slowly lower the leg.  Perform this exercise against resistance later as your leg gets stronger.  Leg Slides: Lying on your back, slowly slide your foot toward your buttocks, bending your knee up off the floor (only go as far as is comfortable). Then slowly slide your foot back down until your leg is flat on the floor again.  Angel Wings: Lying on your back spread your legs to the side as far apart as you can without causing discomfort.  Hamstring Strength:  Lying on your back, push your heel against the floor with your leg straight by tightening up the muscles of your buttocks.  Repeat, but this time bend your knee to a comfortable angle, and push your heel against the floor.  You may put a pillow under the heel to make it more comfortable if necessary.   A rehabilitation program following joint replacement surgery can speed recovery and prevent re-injury in the future due to weakened muscles. Contact your doctor or a physical therapist for more information on knee rehabilitation.    CONSTIPATION  Constipation is defined medically as fewer than three stools per week and severe constipation as less than one stool per week.  Even if you have a regular bowel pattern at home, your normal regimen is likely to be disrupted due to multiple reasons following surgery.  Combination of anesthesia, postoperative  narcotics, change in appetite and fluid intake all can affect your bowels.   YOU MUST use at least one of the following options; they are listed in order of increasing strength to get the job done.  They are all available over the counter, and you may need to use some, POSSIBLY even all of these options:    Drink plenty of fluids (prune juice may be helpful) and high fiber foods Colace 100 mg by mouth twice a day  Senokot for constipation as directed and as needed Dulcolax (bisacodyl), take with full glass of water  Miralax (polyethylene glycol) once or twice a day as needed.  If you have tried all these things and are unable to have a bowel movement in the first 3-4 days after surgery call either your surgeon or your primary doctor.    If you experience loose stools or diarrhea, hold the medications until you stool forms back up.  If your symptoms do not get better within 1 week  or if they get worse, check with your doctor.  If you experience the worst abdominal pain ever or develop nausea or vomiting, please contact the office immediately for further recommendations for treatment.   ITCHING:  If you experience itching with your medications, try taking only a single pain pill, or even half a pain pill at a time.  You can also use Benadryl  over the counter for itching or also to help with sleep.   TED HOSE STOCKINGS:  Use stockings on both legs until for at least 2 weeks or as directed by physician office. They may be removed at night for sleeping.  MEDICATIONS:  See your medication summary on the "After Visit Summary" that nursing will review with you.  You may have some home medications which will be placed on hold until you complete the course of blood thinner medication.  It is important for you to complete the blood thinner medication as prescribed.  PRECAUTIONS:  If you experience chest pain or shortness of breath - call 911 immediately for transfer to the hospital emergency department.    If you develop a fever greater that 101 F, purulent drainage from wound, increased redness or drainage from wound, foul odor from the wound/dressing, or calf pain - CONTACT YOUR SURGEON.                                                   FOLLOW-UP APPOINTMENTS:  If you do not already have a post-op appointment, please call the office for an appointment to be seen by your surgeon.  Guidelines for how soon to be seen are listed in your "After Visit Summary", but are typically between 1-4 weeks after surgery.  OTHER INSTRUCTIONS:   Knee Replacement:  Do not place pillow under knee, focus on keeping the knee straight while resting. CPM instructions: 0-90 degrees, 2 hours in the morning, 2 hours in the afternoon, and 2 hours in the evening. Place foam block, curve side up under heel at all times except when in CPM or when walking.  DO NOT modify, tear, cut, or change the foam block in any way.  POST-OPERATIVE OPIOID TAPER INSTRUCTIONS: It is important to wean off of your opioid medication as soon as possible. If you do not need pain medication after your surgery it is ok to stop day one. Opioids include: Codeine, Hydrocodone(Norco, Vicodin), Oxycodone (Percocet, oxycontin ) and hydromorphone  amongst others.  Long term and even short term use of opiods can cause: Increased pain response Dependence Constipation Depression Respiratory depression And more.  Withdrawal symptoms can include Flu like symptoms Nausea, vomiting And more Techniques to manage these symptoms Hydrate well Eat regular healthy meals Stay active Use relaxation techniques(deep breathing, meditating, yoga) Do Not substitute Alcohol to help with tapering If you have been on opioids for less than two weeks and do not have pain than it is ok to stop all together.  Plan to wean off of opioids This plan should start within one week post op of your joint replacement. Maintain the same interval or time between taking each dose  and first decrease the dose.  Cut the total daily intake of opioids by one tablet each day Next start to increase the time between doses. The last dose that should be eliminated is the evening dose.   MAKE SURE YOU:  Understand these instructions.  Get help right away if you are not doing well or get worse.    Thank you for letting us  be a part of your medical care team.  It is a privilege we respect greatly.  We hope these instructions will help you stay on track for a fast and full recovery!     Dental Antibiotics:  In most cases prophylactic antibiotics for Dental procdeures after total joint surgery are not necessary.  Exceptions are as follows:  1. History of prior total joint infection  2. Severely immunocompromised (Organ Transplant, cancer chemotherapy, Rheumatoid biologic meds such as Humera)  3. Poorly controlled diabetes (A1C &gt; 8.0, blood glucose over 200)  If you have one of these conditions, contact your surgeon for an antibiotic prescription, prior to your dental procedure.

## 2024-01-30 NOTE — Plan of Care (Signed)

## 2024-01-30 NOTE — Progress Notes (Signed)
 Subjective: 1 Day Post-Op Procedures (LRB): ARTHROPLASTY, KNEE, TOTAL (Left) Patient reports pain as moderate.  Had a good session with PT this morning.  The patient prefers to go to short-term skilled nursing following this hospitalization.  Objective: Vital signs in last 24 hours: Temp:  [97 F (36.1 C)-98.5 F (36.9 C)] 98.4 F (36.9 C) (12/20 0958) Pulse Rate:  [78-104] 94 (12/20 0958) Resp:  [15-18] 16 (12/20 0958) BP: (141-163)/(63-93) 163/68 (12/20 0958) SpO2:  [88 %-100 %] 98 % (12/20 0958)  Intake/Output from previous day: 12/19 0701 - 12/20 0700 In: 2320 [P.O.:240; I.V.:1980; IV Piggyback:100] Out: 25 [Blood:25] Intake/Output this shift: No intake/output data recorded.  Recent Labs    01/30/24 0409  HGB 10.6*   Recent Labs    01/30/24 0409  WBC 15.6*  RBC 3.52*  HCT 34.0*  PLT 254   Recent Labs    01/30/24 0409  NA 137  K 4.4  CL 101  CO2 24  BUN 10  CREATININE 0.68  GLUCOSE 225*  CALCIUM  9.2   No results for input(s): LABPT, INR in the last 72 hours.  Sensation intact distally Intact pulses distally Dorsiflexion/Plantar flexion intact Incision: dressing C/D/I   Assessment/Plan: 1 Day Post-Op Procedures (LRB): ARTHROPLASTY, KNEE, TOTAL (Left) Up with therapy Discharge to SNF next week.  Will put in an order for the transitional care team to look at short-term skilled nursing placement.  Since the patient will be here through the weekend, we will have to switch her to inpatient admission today.    Lonni CINDERELLA Poli 01/30/2024, 11:30 AM

## 2024-01-30 NOTE — Care Management Obs Status (Signed)
 MEDICARE OBSERVATION STATUS NOTIFICATION   Patient Details  Name: Brandy Guerrero MRN: [id number] Date of Birth: 11-Apr-1950   Medicare Observation Status Notification Given:  Yes    Sonda Manuella Quill, RN 01/30/2024, 6:09 PM

## 2024-01-30 NOTE — Care Management CC44 (Signed)
"         Condition Code 44 Documentation Completed  Patient Details  Name: Brandy Guerrero MRN: [id number] Date of Birth: December 24, 1950   Condition Code 44 given:  Yes Patient signature on Condition Code 44 notice:  Yes Documentation of 2 MD's agreement:  Yes Code 44 added to claim:  Yes    Sonda Manuella Quill, RN 01/30/2024, 6:09 PM  "

## 2024-01-30 NOTE — Care Management Obs Status (Signed)
 MEDICARE OBSERVATION STATUS NOTIFICATION   Patient Details  Name: Brandy Guerrero MRN: [id number] Date of Birth: 1950-04-30   Medicare Observation Status Notification Given:  Yes    [name], LCSW 01/30/2024, 11:32 AM

## 2024-01-30 NOTE — Plan of Care (Signed)

## 2024-01-31 ENCOUNTER — Observation Stay (HOSPITAL_COMMUNITY)

## 2024-01-31 DIAGNOSIS — M79662 Pain in left lower leg: Secondary | ICD-10-CM | POA: Diagnosis not present

## 2024-01-31 DIAGNOSIS — M1712 Unilateral primary osteoarthritis, left knee: Secondary | ICD-10-CM | POA: Diagnosis not present

## 2024-01-31 LAB — GLUCOSE, CAPILLARY
Glucose-Capillary: 205 mg/dL — ABNORMAL HIGH (ref 70–99)
Glucose-Capillary: 191 mg/dL — ABNORMAL HIGH (ref 70–99)
Glucose-Capillary: 181 mg/dL — ABNORMAL HIGH (ref 70–99)
Glucose-Capillary: 218 mg/dL — ABNORMAL HIGH (ref 70–99)

## 2024-01-31 LAB — HEMOGLOBIN AND HEMATOCRIT, BLOOD
HCT: 29.1 % — ABNORMAL LOW (ref 36.0–46.0)
Hemoglobin: 9.5 g/dL — ABNORMAL LOW (ref 12.0–15.0)

## 2024-01-31 NOTE — Plan of Care (Signed)

## 2024-01-31 NOTE — Progress Notes (Addendum)
 Subjective: 2 Days Post-Op Procedures (LRB): ARTHROPLASTY, KNEE, TOTAL (Left) Patient reports pain as mild.  Denies any chest pain/pressure/palpitations/sob.  Still pain, but not significant. Has been tachycardic since yesterday.    Objective: Vital signs in last 24 hours: Temp:  [98.7 F (37.1 C)-99.3 F (37.4 C)] 99.3 F (37.4 C) (12/21 0730) Pulse Rate:  [98-124] 124 (12/21 0730) Resp:  [16-18] 18 (12/21 0730) BP: (134-169)/(63-70) 134/67 (12/21 0730) SpO2:  [97 %-100 %] 100 % (12/21 0730)  Intake/Output from previous day: 12/20 0701 - 12/21 0700 In: 600 [P.O.:580; I.V.:20] Out: -  Intake/Output this shift: No intake/output data recorded.  Recent Labs    01/30/24 0409  HGB 10.6*   Recent Labs    01/30/24 0409  WBC 15.6*  RBC 3.52*  HCT 34.0*  PLT 254   Recent Labs    01/30/24 0409  NA 137  K 4.4  CL 101  CO2 24  BUN 10  CREATININE 0.68  GLUCOSE 225*  CALCIUM  9.2   No results for input(s): LABPT, INR in the last 72 hours.  Neurologically intact Neurovascular intact Sensation intact distally Intact pulses distally Dorsiflexion/Plantar flexion intact Incision: dressing C/D/I No cellulitis present Compartment soft Left calf pain- negative homan's   Assessment/Plan: 2 Days Post-Op Procedures (LRB): ARTHROPLASTY, KNEE, TOTAL (Left) Advance diet Up with therapy Discharge to SNF likely early this week WBAT LLE Will order u/s LLE r/o dvt Tachycardia- will obtain new H&H. verbal order given to patient's nurse for telemetry.  spouse tells me patient is always tachycardic when taking BP at home.  Hx of a-fib in 2007 and hx of panic attacks.  Patient is currently feeling well and is asymptomatic.  Will order LLE doppler u/s to r/o dvt.        Ronal LITTIE Grave 01/31/2024, 12:06 PM

## 2024-01-31 NOTE — Progress Notes (Signed)
 PT TX NOTE   01/31/24 1400  PT Visit Information  Last PT Received On 01/31/24  Assistance Needed Pt with pending doppler LLE however therapy not placed on hold so continued with afternoon PT session. Pt continues to be motivated to work with therapy. Pt ambulated hallway distance with improved wt shift to LLE. Resting HR 119, 130 max during ambulation.   LLE with anticipated post op edema, no erythema noted, no incr pain during PT session.   Will continue PT during acute stay. Plan is for SNF.   History of Present Illness 73 yo female s/p L TKA on 01/29/24. PMH: OA, DM, obesity, umbilical hernia, afib  Subjective Data  Patient Stated Goal to go to rehab  Precautions  Precautions Fall;Knee  Restrictions  LLE Weight Bearing Per Provider Order WBAT  Pain Assessment  Pain Assessment 0-10  Faces Pain Scale 4  Pain Location left knee  Pain Descriptors / Indicators Aching;Sore;Constant  Pain Intervention(s) Limited activity within patient's tolerance;Monitored during session;Premedicated before session;Repositioned  Cognition  Arousal Alert  Behavior During Therapy WFL for tasks assessed/performed  PT - Cognitive impairments No apparent impairments  Following Commands  Following commands Intact  Cueing  Cueing Techniques Verbal cues  Communication  Communication No apparent difficulties  Bed Mobility  Overal bed mobility Needs Assistance  Bed Mobility Sit to Supine;Supine to Sit  Supine to sit Contact guard;Supervision  Sit to supine Min assist  General bed mobility comments incr time and effort, assist to lift LLE on to bed d/t elevated pain  Transfers  Overall transfer level Needs assistance  Equipment used Rolling walker (2 wheels)  Transfers Sit to/from Stand;Bed to chair/wheelchair/BSC  Sit to Stand Contact guard assist  General transfer comment cues for hand placement. assist to steady  Ambulation/Gait  Ambulation/Gait assistance Contact guard assist  Gait Distance  (Feet) 62 Feet  Assistive device Rolling walker (2 wheels)  Gait Pattern/deviations Step-to pattern;Decreased stance time - left  General Gait Details cues for sequence and RW position, CGA for safety. wt shift to LLE improving (deferred d/t 10/10 pain)  Gait velocity decr  Balance  Sitting-balance support Feet supported  Sitting balance-Leahy Scale Good  Standing balance support During functional activity;Bilateral upper extremity supported;Single extremity supported;No upper extremity supported  Standing balance-Leahy Scale Fair  Total Joint Exercises  Ankle Circles/Pumps AROM;Both;10 reps  PT - End of Session  Equipment Utilized During Treatment Gait belt  Activity Tolerance Patient tolerated treatment well  Patient left with call bell/phone within reach;in bed;with bed alarm set  Nurse Communication Mobility status;Patient requests pain meds   PT - Assessment/Plan  PT Visit Diagnosis Other abnormalities of gait and mobility (R26.89)  PT Frequency (ACUTE ONLY) 7X/week  Follow Up Recommendations Follow physician's recommendations for discharge plan and follow up therapies  Patient can return home with the following A little help with bathing/dressing/bathroom;A little help with walking and/or transfers  PT equipment Other (comment) (defer to SNF)  AM-PAC PT 6 Clicks Mobility Outcome Measure (Version 2)  Help needed turning from your back to your side while in a flat bed without using bedrails? 3  Help needed moving from lying on your back to sitting on the side of a flat bed without using bedrails? 3  Help needed moving to and from a bed to a chair (including a wheelchair)? 3  Help needed standing up from a chair using your arms (e.g., wheelchair or bedside chair)? 3  Help needed to walk in hospital room? 3  Help  needed climbing 3-5 steps with a railing?  3  6 Click Score 18  Consider Recommendation of Discharge To: Home with [facility]  PT Goal Progression  Progress towards PT goals  Progressing toward goals  Acute Rehab PT Goals  PT Goal Formulation With patient  Time For Goal Achievement 02/12/24  Potential to Achieve Goals Good  PT Time Calculation  PT Start Time (ACUTE ONLY) 1433  PT Stop Time (ACUTE ONLY) 1455  PT Time Calculation (min) (ACUTE ONLY) 22 min  PT General Charges  $$ ACUTE PT VISIT 1 Visit  PT Treatments  $Gait Training 8-22 mins

## 2024-01-31 NOTE — Plan of Care (Signed)
   Problem: Education: Goal: Knowledge of General Education information will improve Description: Including pain rating scale, medication(s)/side effects and non-pharmacologic comfort measures Outcome: Progressing   Problem: Pain Managment: Goal: General experience of comfort will improve and/or be controlled Outcome: Progressing   Problem: Safety: Goal: Ability to remain free from injury will improve Outcome: Progressing

## 2024-02-01 ENCOUNTER — Encounter (HOSPITAL_COMMUNITY): Payer: Self-pay | Admitting: Orthopaedic Surgery

## 2024-02-01 DIAGNOSIS — M1712 Unilateral primary osteoarthritis, left knee: Secondary | ICD-10-CM | POA: Diagnosis not present

## 2024-02-01 LAB — GLUCOSE, CAPILLARY
Glucose-Capillary: 189 mg/dL — ABNORMAL HIGH (ref 70–99)
Glucose-Capillary: 177 mg/dL — ABNORMAL HIGH (ref 70–99)

## 2024-02-01 MED ORDER — ASPIRIN 81 MG PO CHEW: 81.0000 mg | CHEWABLE_TABLET | Freq: Two times a day (BID) | ORAL | 0 refills | Status: AC

## 2024-02-01 MED ORDER — OXYCODONE HCL 5 MG PO TABS: 5.0000 mg | ORAL_TABLET | ORAL | 0 refills | Status: AC | PRN

## 2024-02-01 MED ORDER — METHOCARBAMOL 500 MG PO TABS: 500.0000 mg | ORAL_TABLET | Freq: Four times a day (QID) | ORAL | 0 refills | Status: AC | PRN

## 2024-02-01 NOTE — Progress Notes (Signed)
 Patient ID: Brandy Guerrero, female   DOB: 1950/06/28, 73 y.o.   MRN: [id number] The patient is awake, alert and stable.  Her left operative knee is stable.  She did work with therapy on the weekend with her mobility.  She has been requesting short-term skilled nursing placement.  An order was put in over the weekend for the transitional care team to work on this.  I see no notes from them other than when I switch this patient to inpatient admission they had her switch back to observation with a code 44.  That is the only documentation I see from the transitional care team from this weekend.  Hopefully today they can work on short-term skilled nursing placement for the patient.  She can be discharged from orthopedic standpoint.

## 2024-02-01 NOTE — Progress Notes (Signed)
 Physical Therapy Treatment Patient Details Name: Brandy Guerrero MRN: [id number] DOB: 09-11-1950 Today's Date: 02/01/2024   History of Present Illness 73 yo female s/p L TKA on 01/29/24. PMH: OA, DM, obesity, umbilical hernia, afib    PT Comments  POD # 3 pm session Pt just woke from a nap.  Assisted OOB required increased time and effort.  Assisted to the bathroom.  General transfer comment: cues for hand placement and LLE position.  Slightly unsteady in the bathroom with tight turns.  Assist with balance during standing peri care. Amb in hallway.  General Gait Details: decreased amb distance due to increased c/o fatigue and pain.  Decreased weight shift to L and increased weight bearing/support on walker. Assisted to recliner.  Positioned to comfort. Reapplied ICE Man.    If plan is discharge home, recommend the following: A little help with bathing/dressing/bathroom;A little help with walking and/or transfers   Can travel by private vehicle        Equipment Recommendations       Recommendations for Other Services       Precautions / Restrictions Precautions Precautions: Fall;Knee Precaution/Restrictions Comments: no pillow under knee Restrictions Weight Bearing Restrictions Per Provider Order: No LLE Weight Bearing Per Provider Order: Weight bearing as tolerated     Mobility  Bed Mobility Overal bed mobility: Needs Assistance Bed Mobility: Supine to Sit     Supine to sit: Contact guard, Supervision     General bed mobility comments: incr time and effort, cues to lift LLE on to bed using leg lifter    Transfers Overall transfer level: Needs assistance Equipment used: Rolling walker (2 wheels) Transfers: Sit to/from Stand Sit to Stand: Contact guard assist, Min assist           General transfer comment: cues for hand placement and LLE position.  Slightly unsteady in the bathroom with tight turns.  Assist with balance during standing peri care.     Ambulation/Gait Ambulation/Gait assistance: Contact guard assist, Min assist Gait Distance (Feet): 33 Feet Assistive device: Rolling walker (2 wheels) Gait Pattern/deviations: Step-to pattern, Decreased stance time - left Gait velocity: decr     General Gait Details: decreased amb distance due to increased c/o fatigue and pain.  Decreased weight shift to L and increased weight bearing/support on walker.   Stairs             Wheelchair Mobility     Tilt Bed    Modified Rankin (Stroke Patients Only)       Balance                                            Communication Communication Communication: No apparent difficulties  Cognition Arousal: Alert Behavior During Therapy: WFL for tasks assessed/performed   PT - Cognitive impairments: No apparent impairments                       PT - Cognition Comments: AxO x 3 pleasant Lady.  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 Following commands: Intact      Cueing Cueing Techniques: Verbal cues  Exercises      General Comments        Pertinent Vitals/Pain Pain Assessment Pain Assessment: Faces Faces Pain Scale: Hurts little more Pain Location: left knee Pain Descriptors / Indicators: Aching, Sore, Constant, Operative site guarding Pain  Intervention(s): Monitored during session, Premedicated before session, Repositioned, Ice applied    Home Living                          Prior Function            PT Goals (current goals can now be found in the care plan section) Progress towards PT goals: Progressing toward goals    Frequency    7X/week      PT Plan      Co-evaluation              AM-PAC PT 6 Clicks Mobility   Outcome Measure  Help needed turning from your back to your side while in a flat bed without using bedrails?: A Little Help needed moving from lying on your back to sitting on the side of a flat bed without using bedrails?: A  Little Help needed moving to and from a bed to a chair (including a wheelchair)?: A Little Help needed standing up from a chair using your arms (e.g., wheelchair or bedside chair)?: A Little Help needed to walk in hospital room?: A Little Help needed climbing 3-5 steps with a railing? : A Lot 6 Click Score: 17    End of Session Equipment Utilized During Treatment: Gait belt Activity Tolerance: Patient limited by fatigue Patient left: in chair;with call bell/phone within reach;with chair alarm set Nurse Communication: Mobility status PT Visit Diagnosis: Other abnormalities of gait and mobility (R26.89)     Time: 8357-8291 PT Time Calculation (min) (ACUTE ONLY): 26 min  Charges:    $Gait Training: 8-22 mins $Therapeutic Activity: 8-22 mins PT General Charges $$ ACUTE PT VISIT: 1 Visit                     {Kaleigha Chamberlin  PTA Acute  Rehabilitation Services Office M-F          [phone]

## 2024-02-01 NOTE — Progress Notes (Addendum)
 Physical Therapy Treatment Patient Details Name: Brandy Guerrero MRN: [id number] DOB: 13-Jul-1950 Today's Date: 02/01/2024   History of Present Illness 73 yo female s/p L TKA on 01/29/24. Post op dopplers done, negative.  PMH: OA, DM, obesity, umbilical hernia, afib    PT Comments  Pt progressing well, incr amb distance this session.  pt demonstrates carryover with TKA HEP, able to self assist using leg lifter. Resting HR down a little today ~ 110, 119  with activity.   Plan is for SNF post acute. Continue PT in acute setting     If plan is discharge home, recommend the following: A little help with bathing/dressing/bathroom;A little help with walking and/or transfers   Can travel by private vehicle        Equipment Recommendations  Other (comment) (defer to SNF)    Recommendations for Other Services       Precautions / Restrictions Precautions Precautions: Fall;Knee Restrictions LLE Weight Bearing Per Provider Order: Weight bearing as tolerated     Mobility  Bed Mobility Overal bed mobility: Needs Assistance Bed Mobility: Sit to Supine       Sit to supine: Supervision   General bed mobility comments: incr time and effort, cues to lift LLE on to bed using leg lifter    Transfers Overall transfer level: Needs assistance Equipment used: Rolling walker (2 wheels) Transfers: Sit to/from Stand Sit to Stand: Contact guard assist, Supervision           General transfer comment: cues for hand placement and LLE position    Ambulation/Gait Ambulation/Gait assistance: Contact guard assist, Supervision Gait Distance (Feet): 90 Feet (15') Assistive device: Rolling walker (2 wheels) Gait Pattern/deviations: Step-to pattern, Decreased stance time - left Gait velocity: decr     General Gait Details: ongoign education  for sequence and RW position, CGA - supervision for safety. wt shift to LLE improving (deferred d/t 10/10 pain)   Stairs             Wheelchair  Mobility     Tilt Bed    Modified Rankin (Stroke Patients Only)       Balance   Sitting-balance support: Feet supported Sitting balance-Leahy Scale: Good     Standing balance support: During functional activity, Bilateral upper extremity supported, Single extremity supported, No upper extremity supported Standing balance-Leahy Scale: Fair                              Hotel Manager: No apparent difficulties  Cognition Arousal: Alert Behavior During Therapy: WFL for tasks assessed/performed   PT - Cognitive impairments: No apparent impairments                         Following commands: Intact      Cueing Cueing Techniques: Verbal cues  Exercises Total Joint Exercises Ankle Circles/Pumps: AROM, Both, 10 reps Heel Slides: AAROM, Left, 10 reps Straight Leg Raises: AAROM, Strengthening, Left, 10 reps Knee Flexion: AAROM, Left, Seated (7 reps) Goniometric ROM: grossly 8 to 60 degrees AAROM    General Comments        Pertinent Vitals/Pain Pain Assessment Pain Assessment: 0-10 Faces Pain Scale: Hurts little more Pain Location: left knee Pain Descriptors / Indicators: Aching, Sore, Constant Pain Intervention(s): Limited activity within patient's tolerance, Monitored during session, Premedicated before session, Repositioned, Patient requesting pain meds-RN notified, RN gave pain meds during session, Ice applied  Home Living                          Prior Function            PT Goals (current goals can now be found in the care plan section) Acute Rehab PT Goals Patient Stated Goal: to go to rehab PT Goal Formulation: With patient Time For Goal Achievement: 02/12/24 Potential to Achieve Goals: Good Progress towards PT goals: Progressing toward goals    Frequency    7X/week      PT Plan      Co-evaluation              AM-PAC PT 6 Clicks Mobility   Outcome Measure  Help needed  turning from your back to your side while in a flat bed without using bedrails?: A Little Help needed moving from lying on your back to sitting on the side of a flat bed without using bedrails?: A Little Help needed moving to and from a bed to a chair (including a wheelchair)?: A Little Help needed standing up from a chair using your arms (e.g., wheelchair or bedside chair)?: A Little Help needed to walk in hospital room?: A Little Help needed climbing 3-5 steps with a railing? : A Little 6 Click Score: 18    End of Session Equipment Utilized During Treatment: Gait belt Activity Tolerance: Patient tolerated treatment well Patient left: with call bell/phone within reach;with bed alarm set;in bed;with nursing/sitter in room Nurse Communication: Mobility status;Patient requests pain meds PT Visit Diagnosis: Other abnormalities of gait and mobility (R26.89)     Time: 8841-8775 PT Time Calculation (min) (ACUTE ONLY): 26 min  Charges:    $Gait Training: 8-22 mins $Therapeutic Exercise: 8-22 mins PT General Charges $$ ACUTE PT VISIT: 1 Visit                     Vendetta Pittinger, PT  Acute Rehab Dept [facility]) [phone]  02/01/2024    [facility] 02/01/2024, 12:31 PM

## 2024-02-01 NOTE — TOC Initial Note (Addendum)
 Transition of Care [facility]) - Initial/Assessment Note    Patient Details  Name: Brandy Guerrero MRN: [id number] Date of Birth: [birth year]  Transition of Care [facility]) CM/SW Contact:    Heather DELENA Saltness, LCSW Phone Number: 02/01/2024, 11:14 AM  Clinical Narrative:                 CSW met with pt at bedside to discuss SNF rehab and obtain pt's preferred facility. CSW provided pt with list of SNF locations with available beds including name of facility, location, and Medicare Star-Ratings. Pt reports she wanted [facility] in [place], not [place]. CSW sent referral to [facility]. Awaiting response. TOC will continue to follow.  Medicare Star-Ratings  [facility] [address] [place], [place] [address] [phone] Overall rating ? Much below average   Expected Discharge Plan: Skilled Nursing Facility Barriers to Discharge: Continued Medical Work up, SNF Pending bed offer   Patient Goals and CMS Choice Patient states their goals for this hospitalization and ongoing recovery are:: To go to SNF rehab CMS Medicare.gov Compare Post Acute Care list provided to:: Patient Choice offered to / list presented to : Patient [place] ownership interest in [facility].provided to:: Patient    Expected Discharge Plan and Services In-house Referral: Clinical Social Work Discharge Planning Services: NA Post Acute Care Choice: NA Living arrangements for the past 2 months: Single Family Home                 DME Arranged: N/A DME Agency: NA       HH Arranged: NA HH Agency: NA        Prior Living Arrangements/Services Living arrangements for the past 2 months: Single Family Home Lives with:: Self, Spouse Patient language and need for interpreter reviewed:: Yes Do you feel safe going back to the place where you live?: No   Pt reports wanting to go to SNF rehab  Need for Family Participation in Patient Care: Yes (Comment) Care giver  support system in place?: Yes (comment)   Criminal Activity/Legal Involvement Pertinent to Current Situation/Hospitalization: No - Comment as needed  Activities of Daily Living   ADL Screening (condition at time of admission) Independently performs ADLs?: Yes (appropriate for developmental age) Is the patient deaf or have difficulty hearing?: No Does the patient have difficulty seeing, even when wearing glasses/contacts?: No Does the patient have difficulty concentrating, remembering, or making decisions?: No  Permission Sought/Granted Permission sought to share information with : Facility Medical Sales Representative, Family Supports Permission granted to share information with : Yes, Verbal Permission Granted  Share Information with NAME: Luetta Piazza  Permission granted to share info w AGENCY: SNF  Permission granted to share info w Relationship: Spouse  Permission granted to share info w Contact Information: [phone]  Emotional Assessment Appearance:: Appears stated age, Well-Groomed Attitude/Demeanor/Rapport: Engaged Affect (typically observed): Stable, Pleasant, Accepting Orientation: : Oriented to Self, Oriented to Place, Oriented to  Time, Oriented to Situation Alcohol / Substance Use: Not Applicable Psych Involvement: No (comment)  Admission diagnosis:  Primary osteoarthritis of left knee [M17.12] Status post total left knee replacement [Z96.652] Patient Active Problem List   Diagnosis Date Noted   Status post total left knee replacement 01/29/2024   Bilateral primary osteoarthritis of knee 01/06/2024   Unilateral primary osteoarthritis, right knee 01/06/2024   Unilateral primary osteoarthritis, left knee 01/06/2024   Pedal edema 09/30/2023   Gallbladder polyp 08/04/2023   Nocturnal leg cramps 07/02/2023   Elevated alkaline  phosphatase level 07/01/2023   Vitamin B12 deficiency 12/20/2022   Vitamin D  deficiency 12/20/2022   Malignant neoplasm of upper-outer quadrant of  left breast in female, estrogen receptor positive (HCC) 08/31/2020   Medicare annual wellness visit, subsequent 04/24/2020   Advanced care planning/counseling discussion 10/28/2017   Health maintenance examination 10/21/2011   Type 2 diabetes mellitus with other specified complication (HCC) 10/21/2011   Palpitations 09/17/2011   Umbilical hernia 09/17/2011   Obesity, Class I, BMI 30-34.9 09/17/2011   Panic attacks    Hyperlipidemia associated with type 2 diabetes mellitus (HCC)    Acne    Pelvic mass in female    PCP:  Rilla Baller, MD Pharmacy:   [facility] [address] [place] - [address] [place] [address] Phone: [phone] Fax: [phone]   Social Drivers of Health (SDOH) Social History: SDOH Screenings   Food Insecurity: No Food Insecurity (01/29/2024)  Housing: Low Risk (01/29/2024)  Transportation Needs: No Transportation Needs (01/29/2024)  Utilities: Not At Risk (01/29/2024)  Alcohol Screen: Low Risk (06/04/2023)  Depression (PHQ2-9): High Risk (01/06/2024)  Financial Resource Strain: Low Risk (06/04/2023)  Physical Activity: Sufficiently Active (06/04/2023)  Social Connections: Moderately Integrated (01/29/2024)  Stress: No Stress Concern Present (06/04/2023)  Tobacco Use: Low Risk (01/29/2024)  Health Literacy: Adequate Health Literacy (06/04/2023)   SDOH Interventions: None indicated     Readmission Risk Interventions     No data to display         Signed: Heather Saltness, MSW, LCSW Clinical Social Worker Inpatient Care Management 02/01/2024 2:16 PM

## 2024-02-01 NOTE — Plan of Care (Signed)

## 2024-02-01 NOTE — Plan of Care (Signed)

## 2024-02-01 NOTE — NC FL2 (Signed)
 " [place]  MEDICAID FL2 LEVEL OF CARE FORM     IDENTIFICATION  Patient Name: Brandy Guerrero Birthdate: August 18, 1950 Sex: female Admission Date (Current Location): 01/29/2024  [facility] and [place] Number:  Producer, Television/film/video and Address:  [facility],  [address] [place], [place] [address]      Provider Number: [id number]  Attending Physician Name and Address:  Vernetta Lonni GRADE,*  Relative Name and Phone Number:  Niamh, Rada (Spouse), Emergency Contact  [phone]    Current Level of Care: Hospital Recommended Level of Care: Skilled Nursing Facility Prior Approval Number:    Date Approved/Denied:   PASRR Number: [id number]  Discharge Plan: SNF    Current Diagnoses: Patient Active Problem List   Diagnosis Date Noted   Status post total left knee replacement 01/29/2024   Bilateral primary osteoarthritis of knee 01/06/2024   Unilateral primary osteoarthritis, right knee 01/06/2024   Unilateral primary osteoarthritis, left knee 01/06/2024   Pedal edema 09/30/2023   Gallbladder polyp 08/04/2023   Nocturnal leg cramps 07/02/2023   Elevated alkaline phosphatase level 07/01/2023   Vitamin B12 deficiency 12/20/2022   Vitamin D  deficiency 12/20/2022   Malignant neoplasm of upper-outer quadrant of left breast in female, estrogen receptor positive (HCC) 08/31/2020   Medicare annual wellness visit, subsequent 04/24/2020   Advanced care planning/counseling discussion 10/28/2017   Health maintenance examination 10/21/2011   Type 2 diabetes mellitus with other specified complication (HCC) 10/21/2011   Palpitations 09/17/2011   Umbilical hernia 09/17/2011   Obesity, Class I, BMI 30-34.9 09/17/2011   Panic attacks    Hyperlipidemia associated with type 2 diabetes mellitus (HCC)    Acne    Pelvic mass in female     Orientation RESPIRATION BLADDER Height & Weight     Self, Time, Situation, Place  Normal Continent Weight: 180 lb 3.2 oz (81.7 kg) Height:  5'  2 (157.5 cm)  BEHAVIORAL SYMPTOMS/MOOD NEUROLOGICAL BOWEL NUTRITION STATUS      Continent Diet (Carb Modified)  AMBULATORY STATUS COMMUNICATION OF NEEDS Skin   Limited Assist Verbally Surgical wounds, Other (Comment) (Surgical Closed Surgical Incision Knee Left)                       Personal Care Assistance Level of Assistance  Bathing, Feeding, Dressing Bathing Assistance: Limited assistance Feeding assistance: Independent Dressing Assistance: Limited assistance     Functional Limitations Info  Sight, Hearing, Speech Sight Info: Adequate Hearing Info: Adequate Speech Info: Adequate    SPECIAL CARE FACTORS FREQUENCY  PT (By licensed PT), OT (By licensed OT)     PT Frequency: 5x per week OT Frequency: 5x per week            Contractures Contractures Info: Not present    Additional Factors Info  Code Status, Allergies Code Status Info: FULL Allergies Info: Sulfa Antibiotics           Current Medications (02/01/2024):  This is the current hospital active medication list Current Facility-Administered Medications  Medication Dose Route Frequency Provider Last Rate Last Admin   acetaminophen  (TYLENOL ) tablet 325-650 mg  325-650 mg Oral Q6H PRN Vernetta Lonni GRADE, MD   650 mg at 02/01/24 0747   alum & mag hydroxide-simeth (MAALOX/MYLANTA) 200-200-20 MG/5ML suspension 30 mL  30 mL Oral Q4H PRN Vernetta Lonni GRADE, MD       aspirin  chewable tablet 81 mg  81 mg Oral BID Vernetta Lonni GRADE, MD   81 mg at 02/01/24 [phone]  atorvastatin  (LIPITOR) tablet 40 mg  40 mg Oral Daily Vernetta Lonni GRADE, MD   40 mg at 02/01/24 9188   cholecalciferol  (VITAMIN D3) 25 MCG (1000 UNIT) tablet 1,000 Units  1,000 Units Oral Daily Vernetta Lonni GRADE, MD   1,000 Units at 02/01/24 9188   diphenhydrAMINE  (BENADRYL ) 12.5 MG/5ML elixir 12.5-25 mg  12.5-25 mg Oral Q4H PRN Vernetta Lonni GRADE, MD   12.5 mg at 01/30/24 1054   docusate sodium  (COLACE) capsule 100 mg  100  mg Oral BID Vernetta Lonni GRADE, MD   100 mg at 02/01/24 9188   HYDROmorphone  (DILAUDID ) injection 0.5-1 mg  0.5-1 mg Intravenous Q4H PRN Vernetta Lonni GRADE, MD   1 mg at 01/30/24 1741   letrozole  (FEMARA ) tablet 2.5 mg  2.5 mg Oral Daily Vernetta Lonni GRADE, MD   2.5 mg at 02/01/24 9188   menthol  (CEPACOL) lozenge 3 mg  1 lozenge Oral PRN Vernetta Lonni GRADE, MD       Or   phenol (CHLORASEPTIC) mouth spray 1 spray  1 spray Mouth/Throat PRN Vernetta Lonni GRADE, MD       metFORMIN  (GLUCOPHAGE ) tablet 500 mg  500 mg Oral BID WC Vernetta Lonni GRADE, MD   500 mg at 02/01/24 0747   methocarbamol  (ROBAXIN ) tablet 500 mg  500 mg Oral Q6H PRN Vernetta Lonni GRADE, MD   500 mg at 02/01/24 9252   Or   methocarbamol  (ROBAXIN ) injection 500 mg  500 mg Intravenous Q6H PRN Vernetta Lonni GRADE, MD   500 mg at 01/29/24 1559   metoCLOPramide  (REGLAN ) tablet 5-10 mg  5-10 mg Oral Q8H PRN Vernetta Lonni GRADE, MD       Or   metoCLOPramide  (REGLAN ) injection 5-10 mg  5-10 mg Intravenous Q8H PRN Vernetta Lonni GRADE, MD   10 mg at 01/30/24 1739   minoxidil  (LONITEN ) tablet 2.5 mg  2.5 mg Oral Daily Vernetta Lonni GRADE, MD   2.5 mg at 02/01/24 9188   ondansetron  (ZOFRAN ) tablet 4 mg  4 mg Oral Q6H PRN Vernetta Lonni GRADE, MD       Or   ondansetron  (ZOFRAN ) injection 4 mg  4 mg Intravenous Q6H PRN Vernetta Lonni GRADE, MD   4 mg at 02/01/24 0745   oxyCODONE  (Oxy IR/ROXICODONE ) immediate release tablet 10-15 mg  10-15 mg Oral Q4H PRN Vernetta Lonni GRADE, MD   15 mg at 01/31/24 2010   oxyCODONE  (Oxy IR/ROXICODONE ) immediate release tablet 5-10 mg  5-10 mg Oral Q4H PRN Vernetta Lonni GRADE, MD   10 mg at 01/30/24 1054   pantoprazole  (PROTONIX ) EC tablet 40 mg  40 mg Oral Daily Vernetta Lonni GRADE, MD   40 mg at 02/01/24 9188   pioglitazone  (ACTOS ) tablet 30 mg  30 mg Oral Daily Blackman, Christopher Y, MD   30 mg at 02/01/24 9188   vitamin E  capsule 400 Units   400 Units Oral Daily Vernetta Lonni GRADE, MD   400 Units at 02/01/24 9188     Discharge Medications: Please see discharge summary for a list of discharge medications.  Relevant Imaging Results:  Relevant Lab Results:   Additional Information SSN: [id number]  Heather LABOR Maronda Caison, LCSW     "

## 2024-02-02 DIAGNOSIS — M1712 Unilateral primary osteoarthritis, left knee: Secondary | ICD-10-CM | POA: Diagnosis not present

## 2024-02-02 NOTE — Progress Notes (Signed)
 Physical Therapy Treatment Patient Details Name: Brandy Guerrero MRN: [id number] DOB: 1950-02-12 Today's Date: 02/02/2024   History of Present Illness 73 yo female s/p L TKA on 01/29/24. PMH: OA, DM, obesity, umbilical hernia, afib    PT Comments  POD # 4 AxO x 3 pleasant Lady.  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 Prior, Pt was IND/driving.  Assisted with amb in hallway was difficult.  General Gait Details: decreased amb distance due to increased c/o fatigue and pain.  Decreased weight shift to L and increased weight bearing/support on walker. General transfer comment: cues for hand placement and LLE position.  Slightly unsteady in the bathroom with tight turns.  Assist with balance during standing peri care. Assisted back to bed per request to rest.  General bed mobility comments: incr time and effort, cues to lift LLE on to bed using leg lifter.  Then returned to room to perform some TE's following HEP handout.  Instructed on proper tech, freq as well as use of ICE.    Pt will need ST Rehab at SNF prior to safely returning home.       If plan is discharge home, recommend the following: A little help with bathing/dressing/bathroom;A little help with walking and/or transfers   Can travel by private vehicle        Equipment Recommendations       Recommendations for Other Services       Precautions / Restrictions Precautions Precautions: Fall;Knee Precaution/Restrictions Comments: no pillow under knee Restrictions Weight Bearing Restrictions Per Provider Order: No LLE Weight Bearing Per Provider Order: Weight bearing as tolerated     Mobility  Bed Mobility Overal bed mobility: Needs Assistance Bed Mobility: Sit to Supine       Sit to supine: Min assist   General bed mobility comments: incr time and effort, cues to lift LLE on to bed using leg lifter    Transfers Overall transfer level: Needs assistance Equipment used: Rolling walker (2  wheels) Transfers: Sit to/from Stand     Step pivot transfers: Contact guard assist, Min assist       General transfer comment: cues for hand placement and LLE position.  Slightly unsteady in the bathroom with tight turns.  Assist with balance during standing peri care.    Ambulation/Gait Ambulation/Gait assistance: Contact guard assist, Min assist Gait Distance (Feet): 27 Feet Assistive device: Rolling walker (2 wheels) Gait Pattern/deviations: Step-to pattern, Decreased stance time - left Gait velocity: decr     General Gait Details: decreased amb distance due to increased c/o fatigue and pain.  Decreased weight shift to L and increased weight bearing/support on walker.   Stairs             Wheelchair Mobility     Tilt Bed    Modified Rankin (Stroke Patients Only)       Balance                                            Communication Communication Communication: No apparent difficulties  Cognition Arousal: Alert Behavior During Therapy: WFL for tasks assessed/performed   PT - Cognitive impairments: No apparent impairments                       PT - Cognition Comments: AxO x 3 pleasant Lady.  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 Following commands: Intact      Cueing Cueing Techniques: Verbal cues  Exercises  Total Knee Replacement TE's following HEP handout 10 reps B LE ankle pumps 05 reps towel squeezes 05 reps knee presses 05 reps heel slides  05 reps SAQ's 05 reps SLR's 05 reps ABD Educated on use of gait belt to assist with TE's Followed by ICE     General Comments        Pertinent Vitals/Pain Pain Assessment Pain Assessment: 0-10 Pain Score: 5  Pain Location: left knee Pain Descriptors / Indicators: Discomfort, Grimacing, Operative site guarding Pain Intervention(s): Monitored during session, Premedicated before session, Repositioned, Ice applied    Home Living                           Prior Function            PT Goals (current goals can now be found in the care plan section) Progress towards PT goals: Progressing toward goals    Frequency    7X/week      PT Plan      Co-evaluation              AM-PAC PT 6 Clicks Mobility   Outcome Measure  Help needed turning from your back to your side while in a flat bed without using bedrails?: A Little Help needed moving from lying on your back to sitting on the side of a flat bed without using bedrails?: A Little Help needed moving to and from a bed to a chair (including a wheelchair)?: A Little Help needed standing up from a chair using your arms (e.g., wheelchair or bedside chair)?: A Little Help needed to walk in hospital room?: A Little Help needed climbing 3-5 steps with a railing? : A Lot 6 Click Score: 17    End of Session Equipment Utilized During Treatment: Gait belt Activity Tolerance: Patient limited by fatigue Patient left: in bed;with call bell/phone within reach;with bed alarm set Nurse Communication: Mobility status PT Visit Diagnosis: Other abnormalities of gait and mobility (R26.89)     Time: 8577-8552 PT Time Calculation (min) (ACUTE ONLY): 25 min  Charges:    $Gait Training: 8-22 mins $Therapeutic Exercise: 8-22 mins PT General Charges $$ ACUTE PT VISIT: 1 Visit                    Katheryn Leap  PTA Acute  Rehabilitation Services Office M-F          [phone]

## 2024-02-02 NOTE — Progress Notes (Signed)
 Patient ID: Brandy Guerrero, female   DOB: 03-24-1950, 73 y.o.   MRN: [id number] I am currently operating at [facility].  My plan is to check on the patient around lunchtime.  If a short-term skilled nursing facility bed is found today, the patient can be discharged today.  The prescriptions are on the chart and I will go ahead and put in a discharge order and discharge summary for just in case.

## 2024-02-02 NOTE — TOC Progression Note (Signed)
 Transition of Care [facility]) - Progression Note    Patient Details  Name: Brandy Guerrero MRN: [id number] Date of Birth: [birth year]  Transition of Care [facility]) CM/SW Contact  NORMAN ASPEN, LCSW Phone Number: 02/02/2024, 10:03 AM  Clinical Narrative:     Have confirmed bed offer from [facility]) and patient has accepted.  Bed not available until tomorrow.  Will begin insurance authorization.  Expected Discharge Plan: Skilled Nursing Facility Barriers to Discharge: Continued Medical Work up, SNF Pending bed offer               Expected Discharge Plan and Services In-house Referral: Clinical Social Work Discharge Planning Services: NA Post Acute Care Choice: NA Living arrangements for the past 2 months: Single Family Home Expected Discharge Date: 02/02/24               DME Arranged: N/A DME Agency: NA       HH Arranged: NA HH Agency: NA         Social Drivers of Health (SDOH) Interventions SDOH Screenings   Food Insecurity: No Food Insecurity (01/29/2024)  Housing: Low Risk (01/29/2024)  Transportation Needs: No Transportation Needs (01/29/2024)  Utilities: Not At Risk (01/29/2024)  Alcohol Screen: Low Risk (06/04/2023)  Depression (PHQ2-9): High Risk (01/06/2024)  Financial Resource Strain: Low Risk (06/04/2023)  Physical Activity: Sufficiently Active (06/04/2023)  Social Connections: Moderately Integrated (01/29/2024)  Stress: No Stress Concern Present (06/04/2023)  Tobacco Use: Low Risk (01/29/2024)  Health Literacy: Adequate Health Literacy (06/04/2023)    Readmission Risk Interventions     No data to display

## 2024-02-02 NOTE — Progress Notes (Signed)
 Patient ID: Brandy Guerrero, female   DOB: 01/08/1951, 73 y.o.   MRN: [id number] The patient is awake and alert.  Her left operative knee is stable.  Her vital signs are stable.  She has been accepted for short-term skilled nursing but the bed is not available until tomorrow.  We will discharge her tomorrow.

## 2024-02-02 NOTE — Progress Notes (Signed)
 Pt ate some egg salad and it  did not agree with her she feels nauseous Zofran  was administered

## 2024-02-02 NOTE — Plan of Care (Signed)

## 2024-02-02 NOTE — Discharge Summary (Signed)
 Patient ID: Brandy Guerrero MRN: [id number] DOB/AGE: November 10, 1950 73 y.o.  Admit date: 01/29/2024 Discharge date: 02/02/2024  Admission Diagnoses:  Principal Problem:   Unilateral primary osteoarthritis, left knee Active Problems:   Status post total left knee replacement   Discharge Diagnoses:  Same  Past Medical History:  Diagnosis Date   Acne    dermatology - on doxy 100mg  daily   Anemia    Arthritis    Breast cancer (HCC)    Bursitis of right shoulder    COVID-19 02/12/2022   Diabetes mellitus without complication (HCC)    GERD (gastroesophageal reflux disease)    History of atrial fibrillation 05/11/2005   h/o parox Afib, nl cardiolite/echo, no prolonged episodes.  metoprolol  prn [place])   HLD (hyperlipidemia)    Obesity    Panic attacks    Positive occult stool blood test 02/11/2012   s/p normal colonoscopy, rpt stool cards negative    Surgeries: Procedures: ARTHROPLASTY, KNEE, TOTAL on 01/29/2024   Consultants:   Discharged Condition: Improved  Hospital Course: Brandy Guerrero is an 73 y.o. female who was admitted 01/29/2024 for operative treatment ofUnilateral primary osteoarthritis, left knee. Patient has severe unremitting pain that affects sleep, daily activities, and work/hobbies. After pre-op  clearance the patient was taken to the operating room on 01/29/2024 and underwent  Procedures: ARTHROPLASTY, KNEE, TOTAL.    Patient was given perioperative antibiotics:  Anti-infectives (From admission, onward)    Start     Dose/Rate Route Frequency Ordered Stop   01/29/24 2000  ceFAZolin  (ANCEF ) IVPB 2g/100 mL premix        2 g 200 mL/hr over 30 Minutes Intravenous Every 6 hours 01/29/24 1641 01/30/24 1217   01/29/24 1145  ceFAZolin  (ANCEF ) IVPB 2g/100 mL premix        2 g 200 mL/hr over 30 Minutes Intravenous On call to O.R. 01/29/24 1022 01/29/24 1348        Patient was given sequential compression devices, early ambulation, and chemoprophylaxis to  prevent DVT.  Inpatient Morphine  Milligram Equivalents Per Day 12/19 - 12/23   Values displayed are in units of MME/Day    Order Start / End Date 12/19 12/20 12/21  Yesterday Today    oxyCODONE  (Oxy IR/ROXICODONE ) immediate release tablet 5 mg 12/19 - 12/19 7.5 of Unknown -- -- -- --    oxyCODONE  (ROXICODONE ) 5 MG/5ML solution 5 mg 12/19 - 12/19 0 of Unknown -- -- -- --      Group total: 7.5 of Unknown        fentaNYL  (SUBLIMAZE ) injection 50-100 mcg 12/19 - 12/19 30 of 15-30 -- -- -- --    fentaNYL  (SUBLIMAZE ) injection 12/19 - 12/19 *90 of 90 -- -- -- --    HYDROmorphone  (DILAUDID ) injection 0.5-1 mg 12/19 - No end date 10 of 20-40 20 of 60-120 0 of 60-120 20 of 60-120 0 of 60-120    oxyCODONE  (Oxy IR/ROXICODONE ) immediate release tablet 5-10 mg 12/19 - No end date 15 of 15-30 45 of 45-90 0 of 45-90 0 of 45-90 0 of 45-90    oxyCODONE  (Oxy IR/ROXICODONE ) immediate release tablet 10-15 mg 12/19 - No end date 0 of 30-45 45 of 90-135 90 of 90-135 45 of 90-135 0 of 90-135    fentaNYL  (SUBLIMAZE ) injection 25-50 mcg 12/19 - 12/19 45 of 45-90 -- -- -- --    HYDROmorphone  (DILAUDID ) injection 0.25-0.5 mg 12/19 - 12/19 20 of 40-80 -- -- -- --    Daily Totals  * 217.5  of Unknown (at least 255-405) 110 of 195-345 90 of 195-345 65 of 195-345 0 of 195-345  *One-Step medication  Calculation Errors     Order Type Date Details   oxyCODONE  (Oxy IR/ROXICODONE ) immediate release tablet 5 mg Ordered Dose -- Insufficient frequency information   oxyCODONE  (ROXICODONE ) 5 MG/5ML solution 5 mg Ordered Dose -- Insufficient frequency information            Patient benefited maximally from hospital stay and there were no complications.    Recent vital signs: Patient Vitals for the past 24 hrs:  BP Temp Temp src Pulse Resp SpO2  02/02/24 0706 133/60 99.8 F (37.7 C) Oral 98 17 95 %  02/01/24 2229 (!) 118/58 99.6 F (37.6 C) Oral 99 16 96 %  02/01/24 1344 111/62 98.3 F (36.8 C) Oral (!) 103 16 98 %      Recent laboratory studies:  Recent Labs    01/31/24 1332  HGB 9.5*  HCT 29.1*     Discharge Medications:   Allergies as of 02/02/2024       Reactions   Sulfa Antibiotics Rash        Medication List     TAKE these medications    aspirin  81 MG chewable tablet Chew 1 tablet (81 mg total) by mouth 2 (two) times daily.   atorvastatin  40 MG tablet Commonly known as: LIPITOR Take 1 tablet (40 mg total) by mouth daily.   letrozole  2.5 MG tablet Commonly known as: FEMARA  Take 1 tablet by mouth once daily   metFORMIN  500 MG tablet Commonly known as: GLUCOPHAGE  Take 1 tablet (500 mg total) by mouth 2 (two) times daily with a meal.   methocarbamol  500 MG tablet Commonly known as: ROBAXIN  Take 1 tablet (500 mg total) by mouth every 6 (six) hours as needed for muscle spasms.   metoprolol  tartrate 25 MG tablet Commonly known as: LOPRESSOR  Take 1 tablet (25 mg total) by mouth 2 (two) times daily as needed (heart fluttering).   minoxidil  2.5 MG tablet Commonly known as: LONITEN  Take 1 tablet (2.5 mg total) by mouth daily.   OVER THE COUNTER MEDICATION Take 4 capsules by mouth daily. Nutrafol   oxyCODONE  5 MG immediate release tablet Commonly known as: Oxy IR/ROXICODONE  Take 1-2 tablets (5-10 mg total) by mouth every 4 (four) hours as needed for moderate pain (pain score 4-6) (pain score 4-6).   pioglitazone  30 MG tablet Commonly known as: ACTOS  Take 1 tablet (30 mg total) by mouth daily.   Vitamin D  50 MCG (2000 UT) Caps Take 1 capsule (2,000 Units total) by mouth daily.   vitamin E  180 MG (400 UNITS) capsule Take 400 Units by mouth daily.               Durable Medical Equipment  (From admission, onward)           Start     Ordered   01/29/24 1642  DME 3 n 1  Once        01/29/24 1641   01/29/24 1642  DME Walker rolling  Once       Question Answer Comment  Walker: With 5 Inch Wheels   Patient needs a walker to treat with the following  condition Status post total left knee replacement      01/29/24 1641            Diagnostic Studies: VAS US  LOWER EXTREMITY VENOUS (DVT) Result Date: 01/31/2024  Lower Venous DVT Study Patient Name:  Brandy Guerrero  Date of Exam:   01/31/2024 Medical Rec #: [id number]        Accession #:    [id number] Date of Birth: 1950-10-01        Patient Gender: F Patient Age:   26 years Exam Location:  [facility] Procedure:      VAS US  LOWER EXTREMITY VENOUS (DVT) Referring Phys: RONAL GRAVE --------------------------------------------------------------------------------  Indications: Pain, and Post-op.  Comparison Study: No prior exam. Performing Technologist: Edilia Elden Appl  Examination Guidelines: A complete evaluation includes B-mode imaging, spectral Doppler, color Doppler, and power Doppler as needed of all accessible portions of each vessel. Bilateral testing is considered an integral part of a complete examination. Limited examinations for reoccurring indications may be performed as noted. The reflux portion of the exam is performed with the patient in reverse Trendelenburg.  +-----+---------------+---------+-----------+----------+--------------+ RIGHTCompressibilityPhasicitySpontaneityPropertiesThrombus Aging +-----+---------------+---------+-----------+----------+--------------+ CFV  Full           Yes      Yes                                 +-----+---------------+---------+-----------+----------+--------------+ SFJ  Full           Yes      Yes                                 +-----+---------------+---------+-----------+----------+--------------+   +---------+---------------+---------+-----------+----------+--------------+ LEFT     CompressibilityPhasicitySpontaneityPropertiesThrombus Aging +---------+---------------+---------+-----------+----------+--------------+ CFV      Full           Yes      Yes                                  +---------+---------------+---------+-----------+----------+--------------+ SFJ      Full           Yes      Yes                                 +---------+---------------+---------+-----------+----------+--------------+ FV Prox  Full                                                        +---------+---------------+---------+-----------+----------+--------------+ FV Mid   Full                                                        +---------+---------------+---------+-----------+----------+--------------+ FV DistalFull                                                        +---------+---------------+---------+-----------+----------+--------------+ PFV      Full                                                        +---------+---------------+---------+-----------+----------+--------------+  POP      Full           Yes      Yes                                 +---------+---------------+---------+-----------+----------+--------------+ PTV      Full                                                        +---------+---------------+---------+-----------+----------+--------------+ PERO     Full                                                        +---------+---------------+---------+-----------+----------+--------------+     Summary: RIGHT: - No evidence of common femoral vein obstruction.   LEFT: - There is no evidence of deep vein thrombosis in the lower extremity.  - No cystic structure found in the popliteal fossa.  *See table(s) above for measurements and observations. Electronically signed by Debby Robertson on 01/31/2024 at 8:31:07 PM.    Final    DG Knee Left Port Result Date: 01/29/2024 CLINICAL DATA:  Status post left knee replacement. EXAM: DG KNEE 1-2V PORT*L* COMPARISON:  None Available. FINDINGS: Left knee arthroplasty in expected alignment. No periprosthetic lucency or fracture. There has been patellar resurfacing. Recent postsurgical change  includes air and edema in the soft tissues and joint space. Anterior skin staples in place. IMPRESSION: Left knee arthroplasty without immediate postoperative complication. Electronically Signed   By: Andrea Gasman M.D.   On: 01/29/2024 17:00    Disposition: Discharge disposition: 03-Skilled Nursing Facility          Follow-up Information     Vernetta Lonni GRADE, MD. Go on 02/15/2024.   Specialty: Orthopedic Surgery Why: at 1:45 pm for your first in office appointment with Dr. Vernetta Pass information: [address] [place] [address] [phone]                  Signed: Lonni GRADE Vernetta 02/02/2024, 9:02 AM

## 2024-02-03 DIAGNOSIS — M1712 Unilateral primary osteoarthritis, left knee: Secondary | ICD-10-CM | POA: Diagnosis not present

## 2024-02-03 MED ORDER — POLYETHYLENE GLYCOL 3350 17 G PO PACK
17.0000 g | PACK | Freq: Every day | ORAL | Status: DC | PRN
  Administered 2024-02-03: 17 g via ORAL
  Filled 2024-02-03: qty 1

## 2024-02-03 MED ORDER — POLYETHYLENE GLYCOL 3350 17 G PO PACK
17.0000 g | PACK | Freq: Once | ORAL | Status: AC
  Administered 2024-02-03: 17 g via ORAL
  Filled 2024-02-03: qty 1

## 2024-02-03 MED ORDER — FLEET ENEMA RE ENEM: 1.0000 | ENEMA | Freq: Once | RECTAL | Status: DC

## 2024-02-03 MED ORDER — BISACODYL 10 MG RE SUPP
10.0000 mg | Freq: Every day | RECTAL | Status: DC | PRN
  Administered 2024-02-03: 10 mg via RECTAL
  Filled 2024-02-03 (×2): qty 1

## 2024-02-03 MED ORDER — BISACODYL 10 MG RE SUPP
10.0000 mg | Freq: Once | RECTAL | Status: AC
  Administered 2024-02-03: 10 mg via RECTAL

## 2024-02-03 NOTE — Plan of Care (Signed)
  Problem: Education: Goal: Knowledge of General Education information will improve Description Including pain rating scale, medication(s)/side effects and non-pharmacologic comfort measures Outcome: Progressing   Problem: Activity: Goal: Risk for activity intolerance will decrease Outcome: Progressing   Problem: Safety: Goal: Ability to remain free from injury will improve Outcome: Progressing   Problem: Pain Management: Goal: Pain level will decrease with appropriate interventions Outcome: Progressing

## 2024-02-03 NOTE — TOC Transition Note (Addendum)
 Transition of Care [facility]) - Discharge Note   Patient Details  Name: Brandy Guerrero MRN: [id number] Date of Birth: 05-16-1950  [facility] CM/SW Contact:  NORMAN ASPEN, LCSW Phone Number: 02/03/2024, 10:08 AM   Clinical Narrative:     ADDENDUM: Alerted by RN this morning that pt has not had a BM since admission.  This was told to SNF in RN report and facility stated they cannot admit without having had BM.  Had already called [facility] for transport so cancelled that.  MD alerted and meds ordered, however at this time,  no progress for pt.  Facility notes the pharmacy is closing early today and admission will need to now be delayed until Friday due to holiday.  Pt medically cleared for dc today to [facility].  Have received insurance authorization.  Pt and family aware/ agreeable.  [facility] called at 10:05am.  RN to call report to [phone].  No further IP CM needs.  Final next level of care: Skilled Nursing Facility Barriers to Discharge: Barriers Resolved   Patient Goals and CMS Choice Patient states their goals for this hospitalization and ongoing recovery are:: To go to SNF rehab CMS Medicare.gov Compare Post Acute Care list provided to:: Patient Choice offered to / list presented to : Patient [place] ownership interest in [facility].provided to:: Patient    Discharge Placement              Patient chooses bed at: [facility] Patient to be transferred to facility by: [facility] Name of family member notified: son    Discharge Plan and Services Additional resources added to the After Visit Summary for   In-house Referral: Clinical Social Work Discharge Planning Services: NA Post Acute Care Choice: NA          DME Arranged: N/A DME Agency: NA       HH Arranged: NA HH Agency: NA        Social Drivers of Health (SDOH) Interventions SDOH Screenings   Food Insecurity: No Food Insecurity (01/29/2024)  Housing: Low  Risk (01/29/2024)  Transportation Needs: No Transportation Needs (01/29/2024)  Utilities: Not At Risk (01/29/2024)  Alcohol Screen: Low Risk (06/04/2023)  Depression (PHQ2-9): High Risk (01/06/2024)  Financial Resource Strain: Low Risk (06/04/2023)  Physical Activity: Sufficiently Active (06/04/2023)  Social Connections: Moderately Integrated (01/29/2024)  Stress: No Stress Concern Present (06/04/2023)  Tobacco Use: Low Risk (01/29/2024)  Health Literacy: Adequate Health Literacy (06/04/2023)     Readmission Risk Interventions     No data to display

## 2024-02-03 NOTE — Progress Notes (Signed)
 Pt is alert and fully oriented x 4, pleasant, afebrile, vital signs stable, sinus rhythm on the monitor, on room air, normal respiration, no acute distress noted. Left knee incision is dry and clean. On continue cool compression. Knee immobilization is provided during ambulation. Pain is well tolerated. Pt is able to rest and sleep well with no major complaints. Plan of care is reviewed. Pt has been progressing. We will continue to monitor.    02/03/24 2034  Mobility (See group info for [facility] equipment and weight capacities recommendations)  HOB Elevated/Bed Position Self regulated  Activity Ambulated with assistance;Pivoted/transferred to/from BSC;Stood at bedside  Range of Motion/Exercises Active Assistive;All extremities  Level of Assistance Minimal assist, patient does 75% or more  Assistive Device BSC;Front wheel walker  LLE Weight Bearing Per Provider Order WBAT  Distance Ambulated (ft) 10 ft  Activity Response Tolerated well  Transport method Ambulatory  Adult Inpatient Mobility Assessment: Perform at admission/transfer/every shift/prn  Does the patient have exclusion criteria? No- Perform mobility assessment  What is the highest level of mobility based on the mobility assessment? Level 4 (Ambulates with assistance) - Balance while stepping forward/back - Complete   Wendi Dash, RN

## 2024-02-03 NOTE — Progress Notes (Signed)
 Patient ID: Brandy Guerrero, female   DOB: 1951-01-28, 73 y.o.   MRN: [id number] The patient is doing well overall.  She is postop day 5 status post left total knee arthroplasty.  The knee is stable.  She can be discharged to short-term skilled nursing today.

## 2024-02-03 NOTE — Discharge Summary (Signed)
 " Patient ID: Brandy Guerrero MRN: [id number] DOB/AGE: 06-26-1950 73 y.o.  Admit date: 01/29/2024 Discharge date: 02/03/2024  Admission Diagnoses:  Principal Problem:   Unilateral primary osteoarthritis, left knee Active Problems:   Status post total left knee replacement   Discharge Diagnoses:  Same  Past Medical History:  Diagnosis Date   Acne    dermatology - on doxy 100mg  daily   Anemia    Arthritis    Breast cancer (HCC)    Bursitis of right shoulder    COVID-19 02/12/2022   Diabetes mellitus without complication (HCC)    GERD (gastroesophageal reflux disease)    History of atrial fibrillation 05/11/2005   h/o parox Afib, nl cardiolite/echo, no prolonged episodes.  metoprolol  prn [place])   HLD (hyperlipidemia)    Obesity    Panic attacks    Positive occult stool blood test 02/11/2012   s/p normal colonoscopy, rpt stool cards negative    Surgeries: Procedures: ARTHROPLASTY, KNEE, TOTAL on 01/29/2024   Consultants:   Discharged Condition: Improved  Hospital Course: Brandy Guerrero is an 73 y.o. female who was admitted 01/29/2024 for operative treatment ofUnilateral primary osteoarthritis, left knee. Patient has severe unremitting pain that affects sleep, daily activities, and work/hobbies. After pre-op  clearance the patient was taken to the operating room on 01/29/2024 and underwent  Procedures: ARTHROPLASTY, KNEE, TOTAL.    Patient was given perioperative antibiotics:  Anti-infectives (From admission, onward)    Start     Dose/Rate Route Frequency Ordered Stop   01/29/24 2000  ceFAZolin  (ANCEF ) IVPB 2g/100 mL premix        2 g 200 mL/hr over 30 Minutes Intravenous Every 6 hours 01/29/24 1641 01/30/24 1217   01/29/24 1145  ceFAZolin  (ANCEF ) IVPB 2g/100 mL premix        2 g 200 mL/hr over 30 Minutes Intravenous On call to O.R. 01/29/24 1022 01/29/24 1348        Patient was given sequential compression devices, early ambulation, and chemoprophylaxis to  prevent DVT.  Inpatient Morphine  Milligram Equivalents Per Day 12/19 - 12/24   Values displayed are in units of MME/Day    Order Start / End Date 12/19 12/20 12/21  12/22 Yesterday Today    oxyCODONE  (Oxy IR/ROXICODONE ) immediate release tablet 5 mg 12/19 - 12/19 7.5 of Unknown -- -- -- -- --    oxyCODONE  (ROXICODONE ) 5 MG/5ML solution 5 mg 12/19 - 12/19 0 of Unknown -- -- -- -- --      Group total: 7.5 of Unknown         fentaNYL  (SUBLIMAZE ) injection 50-100 mcg 12/19 - 12/19 30 of 15-30 -- -- -- -- --    fentaNYL  (SUBLIMAZE ) injection 12/19 - 12/19 *90 of 90 -- -- -- -- --    HYDROmorphone  (DILAUDID ) injection 0.5-1 mg 12/19 - No end date 10 of 20-40 20 of 60-120 0 of 60-120 20 of 60-120 0 of 60-120 0 of 60-120    oxyCODONE  (Oxy IR/ROXICODONE ) immediate release tablet 5-10 mg 12/19 - No end date 15 of 15-30 45 of 45-90 0 of 45-90 0 of 45-90 7.5 of 45-90 0 of 45-90    oxyCODONE  (Oxy IR/ROXICODONE ) immediate release tablet 10-15 mg 12/19 - No end date 0 of 30-45 45 of 90-135 90 of 90-135 45 of 90-135 60 of 90-135 0 of 90-135    fentaNYL  (SUBLIMAZE ) injection 25-50 mcg 12/19 - 12/19 45 of 45-90 -- -- -- -- --    HYDROmorphone  (DILAUDID ) injection 0.25-0.5 mg 12/19 -  12/19 20 of 40-80 -- -- -- -- --    Daily Totals  * 217.5 of Unknown (at least 255-405) 110 of 195-345 90 of 195-345 65 of 195-345 67.5 of 195-345 0 of 195-345  *One-Step medication  Calculation Errors     Order Type Date Details   oxyCODONE  (Oxy IR/ROXICODONE ) immediate release tablet 5 mg Ordered Dose -- Insufficient frequency information   oxyCODONE  (ROXICODONE ) 5 MG/5ML solution 5 mg Ordered Dose -- Insufficient frequency information            Patient benefited maximally from hospital stay and there were no complications.    Recent vital signs: Patient Vitals for the past 24 hrs:  BP Temp Temp src Pulse Resp SpO2  02/03/24 0532 118/61 99.6 F (37.6 C) Oral 98 18 97 %  02/02/24 2105 130/63 99.6 F (37.6 C) Oral (!)  102 17 100 %  02/02/24 1335 136/61 99.4 F (37.4 C) Oral 95 16 97 %     Recent laboratory studies:  Recent Labs    01/31/24 1332  HGB 9.5*  HCT 29.1*     Discharge Medications:   Allergies as of 02/03/2024       Reactions   Sulfa Antibiotics Rash        Medication List     TAKE these medications    aspirin  81 MG chewable tablet Chew 1 tablet (81 mg total) by mouth 2 (two) times daily.   atorvastatin  40 MG tablet Commonly known as: LIPITOR Take 1 tablet (40 mg total) by mouth daily.   letrozole  2.5 MG tablet Commonly known as: FEMARA  Take 1 tablet by mouth once daily   metFORMIN  500 MG tablet Commonly known as: GLUCOPHAGE  Take 1 tablet (500 mg total) by mouth 2 (two) times daily with a meal.   methocarbamol  500 MG tablet Commonly known as: ROBAXIN  Take 1 tablet (500 mg total) by mouth every 6 (six) hours as needed for muscle spasms.   metoprolol  tartrate 25 MG tablet Commonly known as: LOPRESSOR  Take 1 tablet (25 mg total) by mouth 2 (two) times daily as needed (heart fluttering).   minoxidil  2.5 MG tablet Commonly known as: LONITEN  Take 1 tablet (2.5 mg total) by mouth daily.   OVER THE COUNTER MEDICATION Take 4 capsules by mouth daily. Nutrafol   oxyCODONE  5 MG immediate release tablet Commonly known as: Oxy IR/ROXICODONE  Take 1-2 tablets (5-10 mg total) by mouth every 4 (four) hours as needed for moderate pain (pain score 4-6) (pain score 4-6).   pioglitazone  30 MG tablet Commonly known as: ACTOS  Take 1 tablet (30 mg total) by mouth daily.   Vitamin D  50 MCG (2000 UT) Caps Take 1 capsule (2,000 Units total) by mouth daily.   vitamin E  180 MG (400 UNITS) capsule Take 400 Units by mouth daily.               Durable Medical Equipment  (From admission, onward)           Start     Ordered   01/29/24 1642  DME 3 n 1  Once        01/29/24 1641   01/29/24 1642  DME Walker rolling  Once       Question Answer Comment  Walker: With 5  Inch Wheels   Patient needs a walker to treat with the following condition Status post total left knee replacement      01/29/24 1641  Diagnostic Studies: VAS US  LOWER EXTREMITY VENOUS (DVT) Result Date: 01/31/2024  Lower Venous DVT Study Patient Name:  Brandy Guerrero  Date of Exam:   01/31/2024 Medical Rec #: [id number]        Accession #:    [id number] Date of Birth: 1951/01/07        Patient Gender: F Patient Age:   66 years Exam Location:  [facility] Procedure:      VAS US  LOWER EXTREMITY VENOUS (DVT) Referring Phys: RONAL GRAVE --------------------------------------------------------------------------------  Indications: Pain, and Post-op.  Comparison Study: No prior exam. Performing Technologist: Edilia Elden Appl  Examination Guidelines: A complete evaluation includes B-mode imaging, spectral Doppler, color Doppler, and power Doppler as needed of all accessible portions of each vessel. Bilateral testing is considered an integral part of a complete examination. Limited examinations for reoccurring indications may be performed as noted. The reflux portion of the exam is performed with the patient in reverse Trendelenburg.  +-----+---------------+---------+-----------+----------+--------------+ RIGHTCompressibilityPhasicitySpontaneityPropertiesThrombus Aging +-----+---------------+---------+-----------+----------+--------------+ CFV  Full           Yes      Yes                                 +-----+---------------+---------+-----------+----------+--------------+ SFJ  Full           Yes      Yes                                 +-----+---------------+---------+-----------+----------+--------------+   +---------+---------------+---------+-----------+----------+--------------+ LEFT     CompressibilityPhasicitySpontaneityPropertiesThrombus Aging +---------+---------------+---------+-----------+----------+--------------+ CFV      Full           Yes       Yes                                 +---------+---------------+---------+-----------+----------+--------------+ SFJ      Full           Yes      Yes                                 +---------+---------------+---------+-----------+----------+--------------+ FV Prox  Full                                                        +---------+---------------+---------+-----------+----------+--------------+ FV Mid   Full                                                        +---------+---------------+---------+-----------+----------+--------------+ FV DistalFull                                                        +---------+---------------+---------+-----------+----------+--------------+ PFV      Full                                                        +---------+---------------+---------+-----------+----------+--------------+  POP      Full           Yes      Yes                                 +---------+---------------+---------+-----------+----------+--------------+ PTV      Full                                                        +---------+---------------+---------+-----------+----------+--------------+ PERO     Full                                                        +---------+---------------+---------+-----------+----------+--------------+     Summary: RIGHT: - No evidence of common femoral vein obstruction.   LEFT: - There is no evidence of deep vein thrombosis in the lower extremity.  - No cystic structure found in the popliteal fossa.  *See table(s) above for measurements and observations. Electronically signed by Debby Robertson on 01/31/2024 at 8:31:07 PM.    Final    DG Knee Left Port Result Date: 01/29/2024 CLINICAL DATA:  Status post left knee replacement. EXAM: DG KNEE 1-2V PORT*L* COMPARISON:  None Available. FINDINGS: Left knee arthroplasty in expected alignment. No periprosthetic lucency or fracture. There has been patellar  resurfacing. Recent postsurgical change includes air and edema in the soft tissues and joint space. Anterior skin staples in place. IMPRESSION: Left knee arthroplasty without immediate postoperative complication. Electronically Signed   By: Andrea Gasman M.D.   On: 01/29/2024 17:00    Disposition: Discharge disposition: 03-Skilled Nursing Facility          Contact information for follow-up providers     Vernetta Lonni GRADE, MD. Go on 02/15/2024.   Specialty: Orthopedic Surgery Why: at 1:45 pm for your first in office appointment with Dr. Vernetta Pass information: [address] [place] [address] [phone]              Contact information for after-discharge care     Destination     [facility] .   Service: Skilled Nursing Contact information: [address] [place]  [phone]                      Signed: Lonni GRADE Vernetta 02/03/2024, 7:14 AM    "

## 2024-02-04 DIAGNOSIS — M1712 Unilateral primary osteoarthritis, left knee: Secondary | ICD-10-CM | POA: Diagnosis not present

## 2024-02-04 NOTE — TOC Progression Note (Signed)
 Transition of Care [facility]) - Progression Note    Patient Details  Name: Brandy Guerrero MRN: [id number] Date of Birth: December 25, 1950  Transition of [facility]) CM/SW Contact  Lorraine LILLETTE [place], [place] Phone Number: 02/04/2024, 1:54 PM  Clinical Narrative:     CSW reviewed Note noticing pt had a BM yesterday part of criteria to head to SNF/discharge.  CSW proactively call to facility to determine if able to accept pt on holiday.  Spoke with Bernarda who indicated that they were expecting her and she has a room.  CSW engaged RN who engaged MD, after speaking to pt.  Pt was less interested in DC today as family will visit her here today and it is closer.  After further record/note review CSW learned the pt was held 12/24 for [facility] and pharmacy was closed and not open on Thursday, return call to facility- pt will DC on Friday on concern pharmacy not available. CSW took accountability with RN for engaging too soon and assuming DC could happen when I initially called facility. ICM following.   Expected Discharge Plan: Skilled Nursing Facility Barriers to Discharge: Barriers Resolved               Expected Discharge Plan and Services In-house Referral: Clinical Social Work Discharge Planning Services: NA Post Acute Care Choice: NA Living arrangements for the past 2 months: Single Family Home Expected Discharge Date: 02/04/24               DME Arranged: N/A DME Agency: NA       HH Arranged: NA HH Agency: NA         Social Drivers of Health (SDOH) Interventions SDOH Screenings   Food Insecurity: No Food Insecurity (01/29/2024)  Housing: Low Risk (01/29/2024)  Transportation Needs: No Transportation Needs (01/29/2024)  Utilities: Not At Risk (01/29/2024)  Alcohol Screen: Low Risk (06/04/2023)  Depression (PHQ2-9): High Risk (01/06/2024)  Financial Resource Strain: Low Risk (06/04/2023)  Physical Activity: Sufficiently Active (06/04/2023)  Social Connections: Moderately Integrated  (01/29/2024)  Stress: No Stress Concern Present (06/04/2023)  Tobacco Use: Low Risk (01/29/2024)  Health Literacy: Adequate Health Literacy (06/04/2023)    Readmission Risk Interventions     No data to display

## 2024-02-04 NOTE — Progress Notes (Signed)
 After Pt got Dulcolax suppository x 2, Colace, and Miralax  today. She finally had a huge BM tonight per document below.    02/03/24 2351  Stool Measurement/Characteristics  Bowel Incontinence No  Stool Type Type 7 (Liquid consistency with no solid pieces)  Has the patient had three Type 7 stools in the last 24 hours? Yes (Noninfectious cause of loose stool is known)  Stool Descriptors Denner  Stool Amount Large  Stool Source Rectum     Wendi Dash, RN

## 2024-02-04 NOTE — Progress Notes (Signed)
 Patient stable for discharge to SNF.  D/c order has been placed.  SNF bed approved and available today.

## 2024-02-04 NOTE — Progress Notes (Signed)
 Physical Therapy Treatment Patient Details Name: Brandy Guerrero MRN: [id number] DOB: Mar 02, 1950 Today's Date: 02/04/2024   History of Present Illness 73 yo female s/p L TKA on 01/29/24. PMH: OA, DM, obesity, umbilical hernia, afib    PT Comments  Pt is progressing well with mobility, she ambulated 64' with RW, no loss of balance, decreased weight shift to RLE. Reviewed TKA HEP, pt demonstrates good understanding. Pt is not yet able to perform a straight leg raise independently. L Knee AAROM ~0-65*, knee extension AROM -20*.     If plan is discharge home, recommend the following: A little help with bathing/dressing/bathroom;A little help with walking and/or transfers   Can travel by private vehicle        Equipment Recommendations  Other (comment) (defer to SNF)    Recommendations for Other Services       Precautions / Restrictions Precautions Precautions: Knee Precaution Booklet Issued: Yes (comment) Recall of Precautions/Restrictions: Intact Precaution/Restrictions Comments: no pillow under knee Restrictions LLE Weight Bearing Per Provider Order: Weight bearing as tolerated     Mobility  Bed Mobility               General bed mobility comments: up in recliner    Transfers Overall transfer level: Needs assistance Equipment used: Rolling walker (2 wheels) Transfers: Sit to/from Stand Sit to Stand: Supervision           General transfer comment: VCs hand placement    Ambulation/Gait Ambulation/Gait assistance: Contact guard assist Gait Distance (Feet): 85 Feet Assistive device: Rolling walker (2 wheels) Gait Pattern/deviations: Step-to pattern, Decreased stance time - left, Decreased weight shift to left Gait velocity: decr     General Gait Details: steady, no loss of balance   Stairs             Wheelchair Mobility     Tilt Bed    Modified Rankin (Stroke Patients Only)       Balance   Sitting-balance support: Feet  supported Sitting balance-Leahy Scale: Good     Standing balance support: During functional activity, Bilateral upper extremity supported, Single extremity supported, No upper extremity supported Standing balance-Leahy Scale: Fair                              Hotel Manager: No apparent difficulties  Cognition Arousal: Alert Behavior During Therapy: WFL for tasks assessed/performed   PT - Cognitive impairments: No apparent impairments                       PT - Cognition Comments: AxO x 3 pleasant Lady.  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 Following commands: Intact      Cueing Cueing Techniques: Verbal cues  Exercises Total Joint Exercises Ankle Circles/Pumps: AROM, Both, 10 reps Quad Sets: AROM, Strengthening, Both, 10 reps Short Arc Quad: AAROM, Left, 10 reps, Supine Heel Slides: AAROM, Left, 10 reps, Supine Hip ABduction/ADduction: AROM, Left, 10 reps, AAROM Straight Leg Raises: AAROM, Strengthening, Left, 10 reps Long Arc Quad: AAROM, Left, 10 reps, Seated Knee Flexion: AAROM, Left, Seated, 10 reps    General Comments        Pertinent Vitals/Pain      Home Living                          Prior Function  PT Goals (current goals can now be found in the care plan section) Acute Rehab PT Goals Patient Stated Goal: to go to rehab, return to work as CNA PT Goal Formulation: With patient Time For Goal Achievement: 02/12/24 Potential to Achieve Goals: Good Progress towards PT goals: Progressing toward goals    Frequency    7X/week      PT Plan      Co-evaluation              AM-PAC PT 6 Clicks Mobility   Outcome Measure  Help needed turning from your back to your side while in a flat bed without using bedrails?: None Help needed moving from lying on your back to sitting on the side of a flat bed without using bedrails?: A Little Help needed moving to and  from a bed to a chair (including a wheelchair)?: A Little Help needed standing up from a chair using your arms (e.g., wheelchair or bedside chair)?: A Little Help needed to walk in hospital room?: A Little Help needed climbing 3-5 steps with a railing? : A Little 6 Click Score: 19    End of Session Equipment Utilized During Treatment: Gait belt Activity Tolerance: Patient tolerated treatment well Patient left: in chair;with chair alarm set;with call bell/phone within reach Nurse Communication: Mobility status PT Visit Diagnosis: Other abnormalities of gait and mobility (R26.89)     Time: 8865-8785 PT Time Calculation (min) (ACUTE ONLY): 40 min  Charges:    $Gait Training: 8-22 mins $Therapeutic Exercise: 8-22 mins $Therapeutic Activity: 8-22 mins PT General Charges $$ ACUTE PT VISIT: 1 Visit                     Sylvan Delon Copp PT 02/04/2024  Acute Rehabilitation Services  Office [phone]

## 2024-02-04 NOTE — Discharge Summary (Signed)
 "    Patient ID: Brandy Guerrero MRN: [id number] DOB/AGE: [birth year] 73 y.o.  Admit date: 01/29/2024 Discharge date: 02/04/2024  Admission Diagnoses:  Unilateral primary osteoarthritis, left knee  Discharge Diagnoses:  Principal Problem:   Unilateral primary osteoarthritis, left knee Active Problems:   Status post total left knee replacement   Past Medical History:  Diagnosis Date   Acne    dermatology - on doxy 100mg  daily   Anemia    Arthritis    Breast cancer (HCC)    Bursitis of right shoulder    COVID-19 02/12/2022   Diabetes mellitus without complication (HCC)    GERD (gastroesophageal reflux disease)    History of atrial fibrillation 05/11/2005   h/o parox Afib, nl cardiolite/echo, no prolonged episodes.  metoprolol  prn [place])   HLD (hyperlipidemia)    Obesity    Panic attacks    Positive occult stool blood test 02/11/2012   s/p normal colonoscopy, rpt stool cards negative    Surgeries: Procedures: ARTHROPLASTY, KNEE, TOTAL on 01/29/2024   Consultants (if any):   Discharged Condition: Improved  Hospital Course: Brandy Guerrero is an 73 y.o. female who was admitted 01/29/2024 with a diagnosis of Unilateral primary osteoarthritis, left knee and went to the operating room on 01/29/2024 and underwent the above named procedures.    She was given perioperative antibiotics:  Anti-infectives (From admission, onward)    Start     Dose/Rate Route Frequency Ordered Stop   01/29/24 2000  ceFAZolin  (ANCEF ) IVPB 2g/100 mL premix        2 g 200 mL/hr over 30 Minutes Intravenous Every 6 hours 01/29/24 1641 01/30/24 1217   01/29/24 1145  ceFAZolin  (ANCEF ) IVPB 2g/100 mL premix        2 g 200 mL/hr over 30 Minutes Intravenous On call to O.R. 01/29/24 1022 01/29/24 1348     .  She was given sequential compression devices, early ambulation, and appropriate chemoprophylaxis for DVT prophylaxis.  She benefited maximally from the hospital stay and there were no  complications.    Recent vital signs:  Vitals:   02/03/24 2106 02/04/24 0608  BP: 135/63 134/65  Pulse: (!) 105 (!) 104  Resp: 15 18  Temp: 99.4 F (37.4 C) 98.9 F (37.2 C)  SpO2: 98% 95%    Recent laboratory studies:  Lab Results  Component Value Date   HGB 9.5 (L) 01/31/2024   HGB 10.6 (L) 01/30/2024   HGB 12.7 01/22/2024   Lab Results  Component Value Date   WBC 15.6 (H) 01/30/2024   PLT 254 01/30/2024   No results found for: INR Lab Results  Component Value Date   NA 137 01/30/2024   K 4.4 01/30/2024   CL 101 01/30/2024   CO2 24 01/30/2024   BUN 10 01/30/2024   CREATININE 0.68 01/30/2024   GLUCOSE 225 (H) 01/30/2024    Discharge Medications:   Allergies as of 02/04/2024       Reactions   Sulfa Antibiotics Rash        Medication List     TAKE these medications    aspirin  81 MG chewable tablet Chew 1 tablet (81 mg total) by mouth 2 (two) times daily.   atorvastatin  40 MG tablet Commonly known as: LIPITOR Take 1 tablet (40 mg total) by mouth daily.   letrozole  2.5 MG tablet Commonly known as: FEMARA  Take 1 tablet by mouth once daily   metFORMIN  500 MG tablet Commonly known as: GLUCOPHAGE  Take 1 tablet (500  mg total) by mouth 2 (two) times daily with a meal.   methocarbamol  500 MG tablet Commonly known as: ROBAXIN  Take 1 tablet (500 mg total) by mouth every 6 (six) hours as needed for muscle spasms.   metoprolol  tartrate 25 MG tablet Commonly known as: LOPRESSOR  Take 1 tablet (25 mg total) by mouth 2 (two) times daily as needed (heart fluttering).   minoxidil  2.5 MG tablet Commonly known as: LONITEN  Take 1 tablet (2.5 mg total) by mouth daily.   OVER THE COUNTER MEDICATION Take 4 capsules by mouth daily. Nutrafol   oxyCODONE  5 MG immediate release tablet Commonly known as: Oxy IR/ROXICODONE  Take 1-2 tablets (5-10 mg total) by mouth every 4 (four) hours as needed for moderate pain (pain score 4-6) (pain score 4-6).   pioglitazone   30 MG tablet Commonly known as: ACTOS  Take 1 tablet (30 mg total) by mouth daily.   Vitamin D  50 MCG (2000 UT) Caps Take 1 capsule (2,000 Units total) by mouth daily.   vitamin E  180 MG (400 UNITS) capsule Take 400 Units by mouth daily.               Durable Medical Equipment  (From admission, onward)           Start     Ordered   01/29/24 1642  DME 3 n 1  Once        01/29/24 1641   01/29/24 1642  DME Walker rolling  Once       Question Answer Comment  Walker: With 5 Inch Wheels   Patient needs a walker to treat with the following condition Status post total left knee replacement      01/29/24 1641            Diagnostic Studies: VAS US  LOWER EXTREMITY VENOUS (DVT) Result Date: 01/31/2024  Lower Venous DVT Study Patient Name:  DENIM START  Date of Exam:   01/31/2024 Medical Rec #: [id number]        Accession #:    [id number] Date of Birth: 14-Sep-1950        Patient Gender: F Patient Age:   54 years Exam [facility] Procedure:      VAS US  LOWER EXTREMITY VENOUS (DVT) Referring Phys: RONAL GRAVE --------------------------------------------------------------------------------  Indications: Pain, and Post-op.  Comparison Study: No prior exam. Performing Technologist: Edilia Elden Appl  Examination Guidelines: A complete evaluation includes B-mode imaging, spectral Doppler, color Doppler, and power Doppler as needed of all accessible portions of each vessel. Bilateral testing is considered an integral part of a complete examination. Limited examinations for reoccurring indications may be performed as noted. The reflux portion of the exam is performed with the patient in reverse Trendelenburg.  +-----+---------------+---------+-----------+----------+--------------+ RIGHTCompressibilityPhasicitySpontaneityPropertiesThrombus Aging +-----+---------------+---------+-----------+----------+--------------+ CFV  Full           Yes      Yes                                  +-----+---------------+---------+-----------+----------+--------------+ SFJ  Full           Yes      Yes                                 +-----+---------------+---------+-----------+----------+--------------+   +---------+---------------+---------+-----------+----------+--------------+ LEFT     CompressibilityPhasicitySpontaneityPropertiesThrombus Aging +---------+---------------+---------+-----------+----------+--------------+ CFV      Full  Yes      Yes                                 +---------+---------------+---------+-----------+----------+--------------+ SFJ      Full           Yes      Yes                                 +---------+---------------+---------+-----------+----------+--------------+ FV Prox  Full                                                        +---------+---------------+---------+-----------+----------+--------------+ FV Mid   Full                                                        +---------+---------------+---------+-----------+----------+--------------+ FV DistalFull                                                        +---------+---------------+---------+-----------+----------+--------------+ PFV      Full                                                        +---------+---------------+---------+-----------+----------+--------------+ POP      Full           Yes      Yes                                 +---------+---------------+---------+-----------+----------+--------------+ PTV      Full                                                        +---------+---------------+---------+-----------+----------+--------------+ PERO     Full                                                        +---------+---------------+---------+-----------+----------+--------------+     Summary: RIGHT: - No evidence of common femoral vein obstruction.   LEFT: - There is no evidence of deep  vein thrombosis in the lower extremity.  - No cystic structure found in the popliteal fossa.  *See table(s) above for measurements and observations. Electronically signed by Debby Robertson on 01/31/2024 at 8:31:07 PM.    Final    DG Knee Left Port Result Date: 01/29/2024 CLINICAL DATA:  Status post left  knee replacement. EXAM: DG KNEE 1-2V PORT*L* COMPARISON:  None Available. FINDINGS: Left knee arthroplasty in expected alignment. No periprosthetic lucency or fracture. There has been patellar resurfacing. Recent postsurgical change includes air and edema in the soft tissues and joint space. Anterior skin staples in place. IMPRESSION: Left knee arthroplasty without immediate postoperative complication. Electronically Signed   By: Andrea Gasman M.D.   On: 01/29/2024 17:00    Disposition: Discharge disposition: 03-Skilled Nursing Facility       Discharge Instructions     Call MD / Call 911   Complete by: As directed    If you experience chest pain or shortness of breath, CALL 911 and be transported to the hospital emergency room.  If you develope a fever above 101.5 F, pus (white drainage) or increased drainage or redness at the wound, or calf pain, call your surgeon's office.   Constipation Prevention   Complete by: As directed    Drink plenty of fluids.  Prune juice may be helpful.  You may use a stool softener, such as Colace (over the counter) 100 mg twice a day.  Use MiraLax  (over the counter) for constipation as needed.   Driving restrictions   Complete by: As directed    No driving while taking narcotic pain meds.   Increase activity slowly as tolerated   Complete by: As directed    Post-operative opioid taper instructions:   Complete by: As directed    POST-OPERATIVE OPIOID TAPER INSTRUCTIONS: It is important to wean off of your opioid medication as soon as possible. If you do not need pain medication after your surgery it is ok to stop day one. Opioids include: Codeine ,  Hydrocodone (Norco, Vicodin), Oxycodone (Percocet, oxycontin ) and hydromorphone  amongst others.  Long term and even short term use of opiods can cause: Increased pain response Dependence Constipation Depression Respiratory depression And more.  Withdrawal symptoms can include Flu like symptoms Nausea, vomiting And more Techniques to manage these symptoms Hydrate well Eat regular healthy meals Stay active Use relaxation techniques(deep breathing, meditating, yoga) Do Not substitute Alcohol to help with tapering If you have been on opioids for less than two weeks and do not have pain than it is ok to stop all together.  Plan to wean off of opioids This plan should start within one week post op of your joint replacement. Maintain the same interval or time between taking each dose and first decrease the dose.  Cut the total daily intake of opioids by one tablet each day Next start to increase the time between doses. The last dose that should be eliminated is the evening dose.           Contact information for follow-up providers     Vernetta Lonni GRADE, MD. Go on 02/15/2024.   Specialty: Orthopedic Surgery Why: at 1:45 pm for your first in office appointment with Dr. Vernetta Pass information: [address] [place] [address] [phone]              Contact information for after-discharge care     Destination     [facility] .   Service: Skilled Nursing Contact information: [address] [place]  [address] [phone]                      Signed: Ozell Cummins 02/04/2024, 9:40 AM  "

## 2024-02-04 NOTE — Progress Notes (Deleted)
 Patient ID: Brandy Guerrero, female   DOB: 01/07/1951, 73 y.o.   MRN: [id number] Apparently the patient now can not be discharged to skilled nursing until tomorrow.  She will have been in the hospital for a week post-op by then.

## 2024-02-05 DIAGNOSIS — M1712 Unilateral primary osteoarthritis, left knee: Secondary | ICD-10-CM | POA: Diagnosis not present

## 2024-02-05 NOTE — Progress Notes (Signed)
 Patient ID: Brandy Guerrero, female   DOB: 1950/11/29, 73 y.o.   MRN: [id number] The patient is awake and alert.  She is medically stable and stable from orthopedic standpoint.  She has now been in the hospital for a week.  She has had a bowel movement.  She is supposed to be discharged to short-term skilled nursing today.

## 2024-02-05 NOTE — TOC Transition Note (Signed)
 Transition of Care [facility]) - Discharge Note   Patient Details  Name: Brandy Guerrero MRN: [id number] Date of Birth: [birth year]  Transition of Care [facility]) CM/SW Contact:  NORMAN ASPEN, LCSW Phone Number: 02/05/2024, 10:31 AM   Clinical Narrative:     Pt cleared for dc to [facility] today.  PTAR called at 9:40am.  RN has called report.  No further IP CM needs.  Final next level of care: Skilled Nursing Facility Barriers to Discharge: Barriers Resolved   Patient Goals and CMS Choice Patient states their goals for this hospitalization and ongoing recovery are:: To go to SNF rehab CMS Medicare.gov Compare Post Acute Care list provided to:: Patient Choice offered to / list presented to : Patient [place] ownership interest in [facility].provided to:: Patient    Discharge Placement              Patient chooses bed at: [facility] Patient to be transferred to facility by: PTAR Name of family member notified: son    Discharge Plan and Services Additional resources added to the After Visit Summary for   In-house Referral: Clinical Social Work Discharge Planning Services: NA Post Acute Care Choice: NA          DME Arranged: N/A DME Agency: NA       HH Arranged: NA HH Agency: NA        Social Drivers of Health (SDOH) Interventions SDOH Screenings   Food Insecurity: No Food Insecurity (01/29/2024)  Housing: Low Risk (01/29/2024)  Transportation Needs: No Transportation Needs (01/29/2024)  Utilities: Not At Risk (01/29/2024)  Alcohol Screen: Low Risk (06/04/2023)  Depression (PHQ2-9): High Risk (01/06/2024)  Financial Resource Strain: Low Risk (06/04/2023)  Physical Activity: Sufficiently Active (06/04/2023)  Social Connections: Moderately Integrated (01/29/2024)  Stress: No Stress Concern Present (06/04/2023)  Tobacco Use: Low Risk (01/29/2024)  Health Literacy: Adequate Health Literacy (06/04/2023)     Readmission Risk Interventions      No data to display

## 2024-02-05 NOTE — Discharge Summary (Signed)
 " Patient ID: Brandy Guerrero MRN: [id number] DOB/AGE: May 04, 1950 73 y.o.  Admit date: 01/29/2024 Discharge date: 02/05/2024  Admission Diagnoses:  Principal Problem:   Unilateral primary osteoarthritis, left knee Active Problems:   Status post total left knee replacement   Discharge Diagnoses:  Same  Past Medical History:  Diagnosis Date   Acne    dermatology - on doxy 100mg  daily   Anemia    Arthritis    Breast cancer (HCC)    Bursitis of right shoulder    COVID-19 02/12/2022   Diabetes mellitus without complication (HCC)    GERD (gastroesophageal reflux disease)    History of atrial fibrillation 05/11/2005   h/o parox Afib, nl cardiolite/echo, no prolonged episodes.  metoprolol  prn [place])   HLD (hyperlipidemia)    Obesity    Panic attacks    Positive occult stool blood test 02/11/2012   s/p normal colonoscopy, rpt stool cards negative    Surgeries: Procedures: ARTHROPLASTY, KNEE, TOTAL on 01/29/2024   Consultants:   Discharged Condition: Improved  Hospital Course: Brandy Guerrero is an 73 y.o. female who was admitted 01/29/2024 for operative treatment ofUnilateral primary osteoarthritis, left knee. Patient has severe unremitting pain that affects sleep, daily activities, and work/hobbies. After pre-op  clearance the patient was taken to the operating room on 01/29/2024 and underwent  Procedures: ARTHROPLASTY, KNEE, TOTAL.    Patient was given perioperative antibiotics:  Anti-infectives (From admission, onward)    Start     Dose/Rate Route Frequency Ordered Stop   01/29/24 2000  ceFAZolin  (ANCEF ) IVPB 2g/100 mL premix        2 g 200 mL/hr over 30 Minutes Intravenous Every 6 hours 01/29/24 1641 01/30/24 1217   01/29/24 1145  ceFAZolin  (ANCEF ) IVPB 2g/100 mL premix        2 g 200 mL/hr over 30 Minutes Intravenous On call to O.R. 01/29/24 1022 01/29/24 1348        Patient was given sequential compression devices, early ambulation, and chemoprophylaxis to  prevent DVT.  Inpatient Morphine  Milligram Equivalents Per Day 12/19 - 12/26   Values displayed are in units of MME/Day    Order Start / End Date 12/19 12/20 12/21  12/22 12/23 12/24  Yesterday Today    oxyCODONE  (Oxy IR/ROXICODONE ) immediate release tablet 5 mg 12/19 - 12/19 7.5 of Unknown -- -- -- -- -- -- --    oxyCODONE  (ROXICODONE ) 5 MG/5ML solution 5 mg 12/19 - 12/19 0 of Unknown -- -- -- -- -- -- --      Group total: 7.5 of Unknown           fentaNYL  (SUBLIMAZE ) injection 50-100 mcg 12/19 - 12/19 30 of 15-30 -- -- -- -- -- -- --    fentaNYL  (SUBLIMAZE ) injection 12/19 - 12/19 *90 of 90 -- -- -- -- -- -- --    HYDROmorphone  (DILAUDID ) injection 0.5-1 mg 12/19 - No end date 10 of 20-40 20 of 60-120 0 of 60-120 20 of 60-120 0 of 60-120 0 of 60-120 0 of 60-120 0 of 60-120    oxyCODONE  (Oxy IR/ROXICODONE ) immediate release tablet 5-10 mg 12/19 - No end date 15 of 15-30 45 of 45-90 0 of 45-90 0 of 45-90 7.5 of 45-90 30 of 45-90 0 of 45-90 0 of 45-90    oxyCODONE  (Oxy IR/ROXICODONE ) immediate release tablet 10-15 mg 12/19 - No end date 0 of 30-45 45 of 90-135 90 of 90-135 45 of 90-135 60 of 90-135 0 of 90-135 45 of 90-135 0  of 90-135    fentaNYL  (SUBLIMAZE ) injection 25-50 mcg 12/19 - 12/19 45 of 45-90 -- -- -- -- -- -- --    HYDROmorphone  (DILAUDID ) injection 0.25-0.5 mg 12/19 - 12/19 20 of 40-80 -- -- -- -- -- -- --    Daily Totals  * 217.5 of Unknown (at least 255-405) 110 of 195-345 90 of 195-345 65 of 195-345 67.5 of 195-345 30 of 195-345 45 of 195-345 0 of 195-345  *One-Step medication  Calculation Errors     Order Type Date Details   oxyCODONE  (Oxy IR/ROXICODONE ) immediate release tablet 5 mg Ordered Dose -- Insufficient frequency information   oxyCODONE  (ROXICODONE ) 5 MG/5ML solution 5 mg Ordered Dose -- Insufficient frequency information            Patient benefited maximally from hospital stay and there were no complications.    Recent vital signs: Patient Vitals for the past 24  hrs:  BP Temp Temp src Pulse Resp SpO2  02/05/24 0531 100/83 98.6 F (37 C) Oral 99 17 98 %  02/04/24 2125 (!) 125/53 98.9 F (37.2 C) Oral (!) 109 18 100 %  02/04/24 1255 (!) 127/54 98.2 F (36.8 C) Oral (!) 103 15 96 %     Recent laboratory studies: No results for input(s): WBC, HGB, HCT, PLT, NA, K, CL, CO2, BUN, CREATININE, GLUCOSE, INR, CALCIUM  in the last 72 hours.  Invalid input(s): PT, 2   Discharge Medications:   Allergies as of 02/05/2024       Reactions   Sulfa Antibiotics Rash        Medication List     TAKE these medications    aspirin  81 MG chewable tablet Chew 1 tablet (81 mg total) by mouth 2 (two) times daily.   atorvastatin  40 MG tablet Commonly known as: LIPITOR Take 1 tablet (40 mg total) by mouth daily.   letrozole  2.5 MG tablet Commonly known as: FEMARA  Take 1 tablet by mouth once daily   metFORMIN  500 MG tablet Commonly known as: GLUCOPHAGE  Take 1 tablet (500 mg total) by mouth 2 (two) times daily with a meal.   methocarbamol  500 MG tablet Commonly known as: ROBAXIN  Take 1 tablet (500 mg total) by mouth every 6 (six) hours as needed for muscle spasms.   metoprolol  tartrate 25 MG tablet Commonly known as: LOPRESSOR  Take 1 tablet (25 mg total) by mouth 2 (two) times daily as needed (heart fluttering).   minoxidil  2.5 MG tablet Commonly known as: LONITEN  Take 1 tablet (2.5 mg total) by mouth daily.   OVER THE COUNTER MEDICATION Take 4 capsules by mouth daily. Nutrafol   oxyCODONE  5 MG immediate release tablet Commonly known as: Oxy IR/ROXICODONE  Take 1-2 tablets (5-10 mg total) by mouth every 4 (four) hours as needed for moderate pain (pain score 4-6) (pain score 4-6).   pioglitazone  30 MG tablet Commonly known as: ACTOS  Take 1 tablet (30 mg total) by mouth daily.   Vitamin D  50 MCG (2000 UT) Caps Take 1 capsule (2,000 Units total) by mouth daily.   vitamin E  180 MG (400 UNITS) capsule Take 400  Units by mouth daily.               Durable Medical Equipment  (From admission, onward)           Start     Ordered   01/29/24 1642  DME 3 n 1  Once        01/29/24 1641   01/29/24 1642  DME Walker rolling  Once       Question Answer Comment  Walker: With 5 Inch Wheels   Patient needs a walker to treat with the following condition Status post total left knee replacement      01/29/24 1641            Diagnostic Studies: VAS US  LOWER EXTREMITY VENOUS (DVT) Result Date: 01/31/2024  Lower Venous DVT Study Patient Name:  Brandy Guerrero  Date of Exam:   01/31/2024 Medical Rec #: [id number]        Accession #:    [id number] Date of Birth: 1950-10-31        Patient Gender: F Patient Age:   23 years Exam Location:  [facility] Procedure:      VAS US  LOWER EXTREMITY VENOUS (DVT) Referring Phys: RONAL GRAVE --------------------------------------------------------------------------------  Indications: Pain, and Post-op.  Comparison Study: No prior exam. Performing Technologist: Edilia Elden Appl  Examination Guidelines: A complete evaluation includes B-mode imaging, spectral Doppler, color Doppler, and power Doppler as needed of all accessible portions of each vessel. Bilateral testing is considered an integral part of a complete examination. Limited examinations for reoccurring indications may be performed as noted. The reflux portion of the exam is performed with the patient in reverse Trendelenburg.  +-----+---------------+---------+-----------+----------+--------------+ RIGHTCompressibilityPhasicitySpontaneityPropertiesThrombus Aging +-----+---------------+---------+-----------+----------+--------------+ CFV  Full           Yes      Yes                                 +-----+---------------+---------+-----------+----------+--------------+ SFJ  Full           Yes      Yes                                  +-----+---------------+---------+-----------+----------+--------------+   +---------+---------------+---------+-----------+----------+--------------+ LEFT     CompressibilityPhasicitySpontaneityPropertiesThrombus Aging +---------+---------------+---------+-----------+----------+--------------+ CFV      Full           Yes      Yes                                 +---------+---------------+---------+-----------+----------+--------------+ SFJ      Full           Yes      Yes                                 +---------+---------------+---------+-----------+----------+--------------+ FV Prox  Full                                                        +---------+---------------+---------+-----------+----------+--------------+ FV Mid   Full                                                        +---------+---------------+---------+-----------+----------+--------------+ FV DistalFull                                                        +---------+---------------+---------+-----------+----------+--------------+  PFV      Full                                                        +---------+---------------+---------+-----------+----------+--------------+ POP      Full           Yes      Yes                                 +---------+---------------+---------+-----------+----------+--------------+ PTV      Full                                                        +---------+---------------+---------+-----------+----------+--------------+ PERO     Full                                                        +---------+---------------+---------+-----------+----------+--------------+     Summary: RIGHT: - No evidence of common femoral vein obstruction.   LEFT: - There is no evidence of deep vein thrombosis in the lower extremity.  - No cystic structure found in the popliteal fossa.  *See table(s) above for measurements and observations. Electronically signed  by Debby Robertson on 01/31/2024 at 8:31:07 PM.    Final    DG Knee Left Port Result Date: 01/29/2024 CLINICAL DATA:  Status post left knee replacement. EXAM: DG KNEE 1-2V PORT*L* COMPARISON:  None Available. FINDINGS: Left knee arthroplasty in expected alignment. No periprosthetic lucency or fracture. There has been patellar resurfacing. Recent postsurgical change includes air and edema in the soft tissues and joint space. Anterior skin staples in place. IMPRESSION: Left knee arthroplasty without immediate postoperative complication. Electronically Signed   By: Andrea Gasman M.D.   On: 01/29/2024 17:00    Disposition: Discharge disposition: 03-Skilled Nursing Facility       Discharge Instructions     Call MD / Call 911   Complete by: As directed    If you experience chest pain or shortness of breath, CALL 911 and be transported to the hospital emergency room.  If you develope a fever above 101.5 F, pus (white drainage) or increased drainage or redness at the wound, or calf pain, call your surgeon's office.   Constipation Prevention   Complete by: As directed    Drink plenty of fluids.  Prune juice may be helpful.  You may use a stool softener, such as Colace (over the counter) 100 mg twice a day.  Use MiraLax  (over the counter) for constipation as needed.   Driving restrictions   Complete by: As directed    No driving while taking narcotic pain meds.   Increase activity slowly as tolerated   Complete by: As directed    Post-operative opioid taper instructions:   Complete by: As directed    POST-OPERATIVE OPIOID TAPER INSTRUCTIONS: It is important to wean off of your opioid medication as soon as possible. If you do not need pain medication after  your surgery it is ok to stop day one. Opioids include: Codeine , Hydrocodone (Norco, Vicodin), Oxycodone (Percocet, oxycontin ) and hydromorphone  amongst others.  Long term and even short term use of opiods can cause: Increased pain  response Dependence Constipation Depression Respiratory depression And more.  Withdrawal symptoms can include Flu like symptoms Nausea, vomiting And more Techniques to manage these symptoms Hydrate well Eat regular healthy meals Stay active Use relaxation techniques(deep breathing, meditating, yoga) Do Not substitute Alcohol to help with tapering If you have been on opioids for less than two weeks and do not have pain than it is ok to stop all together.  Plan to wean off of opioids This plan should start within one week post op of your joint replacement. Maintain the same interval or time between taking each dose and first decrease the dose.  Cut the total daily intake of opioids by one tablet each day Next start to increase the time between doses. The last dose that should be eliminated is the evening dose.           Contact information for follow-up providers     Vernetta Lonni GRADE, MD. Go on 02/15/2024.   Specialty: Orthopedic Surgery Why: at 1:45 pm for your first in office appointment with Dr. Vernetta Pass information: [address] [place]  [place] [address] [phone]              Contact information for after-discharge care     Destination     [facility] .   Service: Skilled Nursing Contact information: [address] [place]  [address] [phone]                      Signed: Lonni GRADE Vernetta 02/05/2024, 8:19 AM    "

## 2024-02-15 ENCOUNTER — Encounter: Payer: Self-pay | Admitting: Orthopaedic Surgery

## 2024-02-15 ENCOUNTER — Encounter: Admitting: Orthopaedic Surgery

## 2024-02-15 DIAGNOSIS — Z96652 Presence of left artificial knee joint: Secondary | ICD-10-CM

## 2024-02-15 NOTE — Progress Notes (Signed)
 The patient is a 74 year old who is 2 weeks out from a left total knee replacement.  She has been recovering in a skilled nursing facility.  She is appealing to hopefully stay there another week.  She was in the hospital for 1 week postoperative waiting for skilled nursing to except her as well as there was a Christmas holiday.  On exam today her left knee looks good.  The staples are removed and Steri-Strips applied.  She has good extension and pretty good flexion as well.  Her calf is soft.  She can stop her baby aspirin  twice daily.  Once she has home she should be set up for outpatient physical therapy.  When she does run low pain medication when she is home she will let us  know.  Will see her back in a month to see how she is doing overall but no x-rays are needed.

## 2024-02-22 ENCOUNTER — Telehealth: Payer: Self-pay | Admitting: Family Medicine

## 2024-02-22 DIAGNOSIS — R748 Abnormal levels of other serum enzymes: Secondary | ICD-10-CM

## 2024-02-22 DIAGNOSIS — K824 Cholesterolosis of gallbladder: Secondary | ICD-10-CM

## 2024-02-22 NOTE — Telephone Encounter (Signed)
 Please provide message from provider/office when call is returned from patient.

## 2024-02-22 NOTE — Telephone Encounter (Signed)
 I hope she's recovering well from knee replacement surgery Yes - ok to wait to schedule liver US  until she's more ambulatory. She can call same # as per below when ready to get this scheduled.

## 2024-02-22 NOTE — Telephone Encounter (Signed)
 Left message to return call to our office.

## 2024-02-22 NOTE — Telephone Encounter (Signed)
 Plz notify pt it is time to repeat liver US  - she may call centralized scheduling at [place] to schedule appointment: [phone]

## 2024-02-22 NOTE — Telephone Encounter (Signed)
 Called left detailed message with information and request to call office if any questions.

## 2024-02-22 NOTE — Telephone Encounter (Unsigned)
 Copied from CRM #[id number]. Topic: General - Other >> Feb 22, 2024  1:10 PM Rexford HERO wrote: Reason for CRM: Relayed message about getting the liver ultrasound. Patient stated she just knee replacement surgery on her left knee. She stated she's learning how to walk again. Is it ok to get the Ultrasound after she heals? Patient does not use Mychart.  Call back [phone]

## 2024-03-14 ENCOUNTER — Encounter: Admitting: Orthopaedic Surgery

## 2024-03-21 ENCOUNTER — Encounter: Admitting: Physician Assistant

## 2024-03-23 ENCOUNTER — Inpatient Hospital Stay: Payer: Medicare HMO | Admitting: Hematology and Oncology

## 2024-06-06 ENCOUNTER — Ambulatory Visit

## 2024-06-22 ENCOUNTER — Ambulatory Visit

## 2024-06-24 ENCOUNTER — Other Ambulatory Visit

## 2024-07-01 ENCOUNTER — Encounter: Admitting: Family Medicine

## 2024-12-22 ENCOUNTER — Ambulatory Visit: Admitting: Radiation Oncology
